# Patient Record
Sex: Male | Born: 1975 | Race: White | Hispanic: No | Marital: Married | Smoking: Never smoker
Health system: Southern US, Community
[De-identification: ages and names within clinical notes are randomized; demographics above are authoritative.]

## PROBLEM LIST (undated history)

## (undated) DIAGNOSIS — G4730 Sleep apnea, unspecified: Secondary | ICD-10-CM

## (undated) DIAGNOSIS — E669 Obesity, unspecified: Secondary | ICD-10-CM

## (undated) DIAGNOSIS — M109 Gout, unspecified: Secondary | ICD-10-CM

## (undated) DIAGNOSIS — IMO0001 Reserved for inherently not codable concepts without codable children: Secondary | ICD-10-CM

## (undated) DIAGNOSIS — K219 Gastro-esophageal reflux disease without esophagitis: Secondary | ICD-10-CM

## (undated) DIAGNOSIS — R7989 Other specified abnormal findings of blood chemistry: Secondary | ICD-10-CM

## (undated) DIAGNOSIS — I10 Essential (primary) hypertension: Secondary | ICD-10-CM

## (undated) DIAGNOSIS — R945 Abnormal results of liver function studies: Principal | ICD-10-CM

## (undated) DIAGNOSIS — S4990XA Unspecified injury of shoulder and upper arm, unspecified arm, initial encounter: Secondary | ICD-10-CM

## (undated) HISTORY — DX: Reserved for inherently not codable concepts without codable children: IMO0001

## (undated) HISTORY — DX: Essential (primary) hypertension: I10

## (undated) HISTORY — DX: Other specified abnormal findings of blood chemistry: R79.89

## (undated) HISTORY — DX: Unspecified injury of shoulder and upper arm, unspecified arm, initial encounter: S49.90XA

## (undated) HISTORY — DX: Abnormal results of liver function studies: R94.5

---

## 2006-05-14 ENCOUNTER — Emergency Department: Payer: Self-pay | Admitting: Internal Medicine

## 2006-05-15 ENCOUNTER — Emergency Department: Payer: Self-pay | Admitting: Emergency Medicine

## 2010-09-14 DIAGNOSIS — R0602 Shortness of breath: Secondary | ICD-10-CM

## 2010-09-15 ENCOUNTER — Observation Stay: Payer: Self-pay | Admitting: Internal Medicine

## 2010-09-15 ENCOUNTER — Ambulatory Visit: Payer: 59 | Admitting: Family Medicine

## 2010-09-15 ENCOUNTER — Encounter: Payer: Self-pay | Admitting: Cardiovascular Disease

## 2010-09-15 ENCOUNTER — Encounter: Payer: Self-pay | Admitting: Family Medicine

## 2010-09-15 DIAGNOSIS — R152 Fecal urgency: Secondary | ICD-10-CM

## 2010-09-15 DIAGNOSIS — I10 Essential (primary) hypertension: Secondary | ICD-10-CM | POA: Insufficient documentation

## 2010-09-15 DIAGNOSIS — K5900 Constipation, unspecified: Secondary | ICD-10-CM

## 2010-09-16 ENCOUNTER — Encounter: Payer: Self-pay | Admitting: Cardiovascular Disease

## 2010-09-16 ENCOUNTER — Encounter: Payer: Self-pay | Admitting: Family Medicine

## 2010-09-16 DIAGNOSIS — R079 Chest pain, unspecified: Secondary | ICD-10-CM

## 2010-09-22 ENCOUNTER — Telehealth: Payer: Self-pay | Admitting: Cardiovascular Disease

## 2010-09-25 NOTE — Assessment & Plan Note (Signed)
Summary: CONSTIPATION AND ABD PAIN/EVM   Vital Signs:  Patient Profile:   35 Years Old Male CC:      constipation, abdominal discomfort Weight:      257 pounds O2 Sat:      97 % O2 treatment:    Room Air Temp:     97.6 degrees F oral Pulse rate:   109 / minute Pulse rhythm:   regular Resp:     14 per minute BP sitting:   225 / 141  (right arm)  Pt. in pain?   no                  Serial Vital Signs/Assessments:  Time      Position  BP       Pulse  Resp  Temp     By 1134 am             224/139  107    12             Clanford Johnson MD   Current Allergies (reviewed today): No known allergies History of Present Illness History from: patient Reason for visit: see chief complaint Chief Complaint: constipation, abdominal discomfort History of Present Illness: The patient presented today for evaluation because he has not been able to have a full bowel movement for 1 week.  He says that he feels bloated and he has been taking OTC laxative medications with only minimal relief.  He has a history of HTN and says that he has not been taking any medications for a long time.  He has not seen a care provider in a long time.  He denies CP and SOB.  The patient reports that he has not seen a physician in several years. He reports that he stopped taking his blood pressure medication years ago because it made him feel tired and he did not like that feeling. The reason he came in today was because he was concerned about not being able to have a bowel movement for a week. He reports that he tried Colace and try milk of magnesia but no significant improvement. He reports that he did have a very small bowel movement recently but there was no significant volume expelled. The patient reports that he's been straining to defecate. The patient reports no blood in the stool. The patient reports that he has cramping pain in the abdomen. The patient reports that his blood pressures have been high for very long  time. He reports that he expected that the blood pressure would be very high.  REVIEW OF SYSTEMS Constitutional Symptoms      Denies fever, chills, night sweats, weight loss, weight gain, and fatigue.  Eyes       Denies change in vision, eye pain, eye discharge, glasses, contact lenses, and eye surgery. Ear/Nose/Throat/Mouth       Denies hearing loss/aids, change in hearing, ear pain, ear discharge, dizziness, frequent runny nose, frequent nose bleeds, sinus problems, sore throat, hoarseness, and tooth pain or bleeding.  Respiratory       Denies dry cough, productive cough, wheezing, shortness of breath, asthma, bronchitis, and emphysema/COPD.  Cardiovascular       Denies murmurs, chest pain, and tires easily with exhertion.    Gastrointestinal       Complains of stomach pain, nausea/vomiting, constipation, and indigestion.      Denies diarrhea and blood in bowel movements. Genitourniary       Denies painful urination, kidney stones, and  loss of urinary control. Neurological       Denies paralysis, seizures, and fainting/blackouts. Musculoskeletal       Complains of gout.      Denies muscle pain, joint pain, joint stiffness, decreased range of motion, redness, swelling, and muscle weakness.  Skin       Denies bruising, unusual mles/lumps or sores, and hair/skin or nail changes.  Psych       Denies mood changes, temper/anger issues, anxiety/stress, speech problems, depression, and sleep problems.  Past History:  Past Medical History: hypertension Poor Medical Follow Up Care  Past Surgical History: Denies surgical history  Family History: Mother-hypertension Father-unknown medical history Brother alive and healthy  Social History: The Patient Denies Tobacco, Alcohol and Recreational Drug Use. Physical Exam General appearance: poorly groomed, well developed, well nourished, no acute distress Head: normocephalic, atraumatic Eyes: conjunctivae and lids normal Pupils: equal,  round, reactive to light Ears: normal, no lesions or deformities Nasal: mucosa pink, nonedematous, no septal deviation, turbinates normal Oral/Pharynx: tongue normal, posterior pharynx without erythema or exudate Neck: neck supple,  trachea midline, no masses Thyroid: no nodules, masses, tenderness, or enlargement Chest/Lungs: no rales, wheezes, or rhonchi bilateral, breath sounds equal without effort Heart: tachycardic rate and bounding pulses Abdomen: soft, non-tender without obvious organomegaly, digital rectal exam reveals no impacted stool GU: no suprapubic tenderness Extremities: normal extremities Neurological: grossly intact and non-focal Skin: no obvious rashes or lesions MSE: oriented to time, place, and person Assessment New Problems: HYPERTENSION, ESSENTIAL, UNCONTROLLED (ICD-401.9) CONSTIPATION (ICD-564.00) FECAL URGENCY (ZOX-096.04)   Patient Education: The risks, benefits and possible side effects were clearly explained and discussed with the patient.  The patient verbalized clear understanding.  The patient was given instructions to return if symptoms don't improve, worsen or new changes develop.  If it is not during clinic hours and the patient cannot get back to this clinic then the patient was told to seek medical care at an available urgent care or emergency department.  The patient verbalized understanding.   Demonstrates willingness to comply.  Plan Planning Comments:   I advised the patient to go directly to the emergency department for evaluation of his hypertensive urgency. His blood pressure was 225/150. I was concerned that we needed to check for endorgan damage. He needed an EKG and urinalysis and electrolytes to be tested. Also because of his severe constipation he needed an enema and possible hospitalization. I discussed this with the patient who was in agreement and also with his mother who agreed to take him immediately to the hospital by private vehicle. I  called over to [facility] and spoke with the admissions charge nurse and checked out to her regarding the patient's condition and findings.  She said that she will be able to See the Patient and Get Him Treated Immediately.  Follow Up: after treatment in the ED  The patient and/or caregiver has been counseled thoroughly with regard to medications prescribed including dosage, schedule, interactions, rationale for use, and possible side effects and they verbalize understanding.  Diagnoses and expected course of recovery discussed and will return if not improved as expected or if the condition worsens. Patient and/or caregiver verbalized understanding.   Patient Instructions: 1)  Go to the emergency department at [facility] for treatment of your elevated blood pressure and severe constipation. We have already called over there and given them your name and information and they will be expecting you to arrive shortly. 2)  Check your  Blood Pressure regularly. If it is above: 140/90 you should make an appointment. 3)  Limit your Sodium (Salt) to less than 2 grams a day(slightly less than 1/2 a teaspoon) to prevent fluid retention, swelling, or worsening of symptoms. 4)  The patient was informed that there is no on-call provider or services available at this clinic during off-hours (when the clinic is closed).  If the patient developed a problem or concern that required immediate attention, the patient was advised to go the the nearest available urgent care or emergency department for medical care.  The patient verbalized understanding.    5)  Return or go to the ER if no improvement or symptoms getting worse.    Medication Administration  Medication # 1:    Medication: Clonidine 0.1mg  tab    Diagnosis: HYPERTENSION, ESSENTIAL, UNCONTROLLED (ICD-401.9)    Dose: 2 tabs    Route: po

## 2010-09-25 NOTE — Progress Notes (Signed)
Summary: History and Physical from ER  History and Physical from ER   Imported By: Dorna Leitz 09/16/2010 16:18:06  _____________________________________________________________________  External Attachment:    Type:   Image     Comment:   External Document

## 2010-09-25 NOTE — Letter (Signed)
Summary: Cardiopulmonary ECG  Cardiopulmonary ECG   Imported By: Dorna Leitz 09/16/2010 14:13:45  _____________________________________________________________________  External Attachment:    Type:   Image     Comment:   External Document

## 2010-09-25 NOTE — Letter (Signed)
Summary: Faxed medical release form  Faxed medical release form   Imported By: Dorna Leitz 09/16/2010 16:19:40  _____________________________________________________________________  External Attachment:    Type:   Image     Comment:   External Document

## 2010-09-29 ENCOUNTER — Encounter: Payer: Self-pay | Admitting: Cardiovascular Disease

## 2010-09-29 ENCOUNTER — Encounter (INDEPENDENT_AMBULATORY_CARE_PROVIDER_SITE_OTHER): Payer: 59 | Admitting: Cardiovascular Disease

## 2010-09-29 DIAGNOSIS — I10 Essential (primary) hypertension: Secondary | ICD-10-CM

## 2010-09-29 DIAGNOSIS — I517 Cardiomegaly: Secondary | ICD-10-CM

## 2010-10-01 NOTE — Progress Notes (Signed)
Summary: Problems sleeping  Phone Note Call from Patient Call back at [phone]   Caller: Dwaine Deter (mother) Call For: Mariah Milling Summary of Call: Pt is having nightmares that people are killing him from the BP medication that was prescribed to him in the hospital.  Pt is unable to sleep.  Pt has tried OTC sleep aides and they are not helping.  Pt is feeling exhausted.  Pt has never been a good sleeper.  Would like something to help him sleep. Initial call taken by: Harlon Flor,  September 22, 2010 9:42 AM  Follow-up for Phone Call        sorry, we don't do sleep meds. Would suggest he talk with PMD     Appended Document: Problems sleeping pt notified [place]

## 2010-10-07 NOTE — Assessment & Plan Note (Signed)
Summary: F/U [facility] Myoview   Visit Type:  Initial Consult Primary Provider:  None  CC:  F/U [facility].  He has bad dreams with the new blood pressure medication & fatigue.Marland Kitchen  History of Present Illness: Jared Watkins is a pleasant 35 year old gentleman with obesity, hypertension, right bundle branch block, moderate LVH, hyperlipidemia who presented to the hospital on every 6 with abdominal pain, constipation and severe hypertension. Cardiology was consulted for his hypertension. He presents for routine followup today. he had borderline elevation of his cardiac enzymes and hospitalist service had ordered a stress test.  He was diagnosed with hypertension 3 or more years ago and took medications for a while though then stopped on his own. Recent development of abdominal pain felt secondary to constipation. He has been under any stress recently. In the emergency room, initial blood pressure 234/137. He was started on lisinopril HCT and metoprolol.  In followup today, he reports his blood pressure has improved with systolic still in the 150-160 range, diastolic in the 100 range occasional 90s. He feels tired and believes it is the medicine. No other side effects noted. No further constipation  Myoview showed no ischemia, ejection fraction 42%, mildly dilated left ventricle  EKG shows normal sinus rhythm with right bundle branch block, rate 74 beats per minute left atrial enlargement  Echocardiogram Sep 15 2010 shows moderate LVH, ejection fraction 50%, mild MR, mildly dilated left atrium  Current Medications (verified): 1)  Metoprolol Tartrate 50 Mg Tabs (Metoprolol Tartrate) .Marland Kitchen.. 1 Tablet Two Times A Day 2)  Lisinopril-Hydrochlorothiazide 20-25 Mg Tabs (Lisinopril-Hydrochlorothiazide) .Marland Kitchen.. 1 Tablet Once Daily 3)  Aspir-Low 81 Mg Tbec (Aspirin) .Marland Kitchen.. 1 Tablet Once Daily 4)  Senokot 8.6 Mg Tabs (Sennosides) .Marland Kitchen.. 1 To 2 Tablets By Mouth Daily As Needed For Constipation 5)  Colace 100 Mg Caps (Docusate  Sodium) .Marland Kitchen.. 1 Tablet By Mouth Daily  Allergies (verified): No Known Drug Allergies  Past History:  Past Medical History: Last updated: 09/26/2010 Hypertension Poor Medical Follow Up Care Hyperlipidemia  Past Surgical History: Last updated: 09/15/2010 Denies surgical history  Family History: Last updated: 09/26/2010 Mother-hypertension Father-liver disease Brother-HTN  Social History: Last updated: 09/26/2010 The Patient Denies Tobacco, Alcohol and Recreational Drug Use. Full Time Married   Review of Systems  The patient denies fever, weight loss, weight gain, vision loss, decreased hearing, hoarseness, chest pain, syncope, dyspnea on exertion, peripheral edema, prolonged cough, abdominal pain, incontinence, muscle weakness, depression, and enlarged lymph nodes.         fatigue  Vital Signs:  Patient profile:   35 year old male Height:      75 inches Weight:      267 pounds BMI:     33.49 Pulse rate:   74 / minute BP sitting:   162 / 98  (left arm) Cuff size:   large  Vitals Entered By: Bishop Dublin, CMA (September 29, 2010 11:14 AM)  Physical Exam  General:  Well developed, well nourished, in no acute distress.mildly obese Head:  normocephalic and atraumatic Neck:  Neck supple, no JVD. No masses, thyromegaly or abnormal cervical nodes. Lungs:  Clear bilaterally to auscultation and percussion. Heart:  Non-displaced PMI, chest non-tender; regular rate and rhythm, S1, S2 without murmurs, rubs or gallops. Carotid upstroke normal, no bruit. Pedals normal pulses. No edema, no varicosities. Abdomen:  Bowel sounds positive; abdomen soft and non-tender without masses Msk:  Back normal, normal gait. Muscle strength and tone normal. Pulses:  pulses normal in all 4 extremities Extremities:  No clubbing or cyanosis. Neurologic:  Alert and oriented x 3. Skin:  Intact without lesions or rashes. Psych:  Normal affect.   Impression & Recommendations:  Problem # 1:   HYPERTENSION, ESSENTIAL, UNCONTROLLED (ICD-401.9) blood pressure continues to be elevated. We will increase his lisinopril HCT to 20/25 mg b.i.d. up from daily. We will decrease his metoprolol to 25 mg b.i.d. in an effort to improve his fatigue. I've asked him to monitor his blood pressure and if he continues to be elevated, we could consider any additional medication such as clonidine 0.1 mg b.i.d.. Alternatively we could try amlodipine would be concerned about lower extremity edema.  His updated medication list for this problem includes:    Metoprolol Tartrate 50 Mg Tabs (Metoprolol tartrate) .Marland Kitchen... Take 1/2 tablet two times a day    Lisinopril-hydrochlorothiazide 20-25 Mg Tabs (Lisinopril-hydrochlorothiazide) .Marland Kitchen... Take one tablet two times a day    Aspir-low 81 Mg Tbec (Aspirin) .Marland Kitchen... 1 tablet once daily  Problem # 2:  PREVENTIVE HEALTH CARE (ICD-V70.0) Cholesterol was in a reasonable range when checked in the hospital. Total cholesterol 168, HDL 39, LDL 105  Problem # 3:  VENTRICULAR HYPERTROPHY, LEFT (ICD-429.3) Moderate LVH on echo with diastolic dysfunction. No significant symptoms of shortness of breath or edema. Continue him on diuretic with HCTZ50 mg daily.  Other Orders: EKG w/ Interpretation (93000)  Patient Instructions: 1)  Your physician recommends that you schedule a follow-up appointment in: 3 months. 2)  Your physician has recommended you make the following change in your medication: Start taking Metorplol 50 mg 1/2 tablet two times a day & Lisinopril 20/25 mg two times a day.

## 2010-10-10 ENCOUNTER — Telehealth: Payer: Self-pay | Admitting: Cardiovascular Disease

## 2010-10-16 NOTE — Progress Notes (Signed)
Summary: Blood Pressure Issues Associated with Dizziness  Phone Note Call from Patient   Caller: Spouse Call For: Gollan Summary of Call: Patient's wife "Jared Watkins" called and states that since Jared Watkins was in our office last and his blood pressure medications were change he is having problems with dizziness after he bends over and stands up.  Also we he has been sitting and stands up he feels dizzy.  Please call wife back at work [phone] Initial call taken by: [place],  October 10, 2010 11:22 AM  Follow-up for Phone Call        Called pt's wife, she was busy and will call me back today. Lanny Hurst RN  October 10, 2010 4:40 PM  Spoke to pt's wife, she states that pt has been monitoring BP since his appt. And she states his dizziness seems to only occur when he stands up quickly or changes position. We started pt on Metoprolol 50mg  1/2 tab bid and lisinopril 20/25 qd. Pt's wife does not recall what his BP has been running exactly, but she does have it written down at home. Advised her to have pt call me Monday with update of symptoms and to give [place] copy of BP results with symptoms/frequency written as well. She recalls his SBP not ever being below 100. Told pt's wife this could be him getting used to medication since he was not on before.  Advised that pt change positions slowly until Dr. Mariah Milling can review his BP numbers to see if possible change is needed. Otherwise pt will call back on Monday. Follow-up by: Lanny Hurst RN,  October 10, 2010 5:07 PM

## 2010-10-21 NOTE — Letter (Signed)
[facility]   [facility]   Imported By: Roderic Ovens 10/14/2010 15:06:25  _____________________________________________________________________  External Attachment:    Type:   Image     Comment:   External Document

## 2010-10-21 NOTE — Letter (Signed)
Summary[facility]   Imported By: Earl Many 10/14/2010 16:25:40  _____________________________________________________________________  External Attachment:    Type:   Image     Comment:   External Document

## 2010-10-21 NOTE — Letter (Signed)
Summary: [facility]: Discharge Summary  [facility]: Discharge Summary   Imported By: Earl Many 10/14/2010 16:24:02  _____________________________________________________________________  External Attachment:    Type:   Image     Comment:   External Document

## 2010-10-21 NOTE — Letter (Signed)
Summary: [facility]: [place]- [place] [facility] Myocardial ST Scan  [facility]: [place]- [place] [facility] Myocardial ST Scan   Imported By: Earl Many 10/14/2010 16:27:44  _____________________________________________________________________  External Attachment:    Type:   Image     Comment:   External Document

## 2010-12-29 ENCOUNTER — Ambulatory Visit: Payer: 59 | Admitting: Cardiovascular Disease

## 2010-12-29 ENCOUNTER — Telehealth: Payer: Self-pay | Admitting: Cardiovascular Disease

## 2010-12-29 NOTE — Telephone Encounter (Signed)
Attempted to call pt in regards to a missed appt with Gollan on 12/29/10.  Voicemail is full and I was unable to leave a message.

## 2011-07-06 ENCOUNTER — Ambulatory Visit (INDEPENDENT_AMBULATORY_CARE_PROVIDER_SITE_OTHER): Payer: 59 | Admitting: Internal Medicine

## 2011-07-06 ENCOUNTER — Encounter: Payer: Self-pay | Admitting: Internal Medicine

## 2011-07-06 VITALS — BP 152/100 | HR 85 | Temp 98.3°F | Wt 305.0 lb

## 2011-07-06 DIAGNOSIS — G4700 Insomnia, unspecified: Secondary | ICD-10-CM

## 2011-07-06 DIAGNOSIS — Z0000 Encounter for general adult medical examination without abnormal findings: Secondary | ICD-10-CM

## 2011-07-06 DIAGNOSIS — I10 Essential (primary) hypertension: Secondary | ICD-10-CM

## 2011-07-06 DIAGNOSIS — Z23 Encounter for immunization: Secondary | ICD-10-CM

## 2011-07-06 LAB — CBC WITH DIFFERENTIAL/PLATELET
Basophils Absolute: 0 10*3/uL (ref 0.0–0.1)
Eosinophils Relative: 2.5 % (ref 0.0–5.0)
Lymphocytes Relative: 22.9 % (ref 12.0–46.0)
Lymphs Abs: 2.4 10*3/uL (ref 0.7–4.0)
Monocytes Relative: 8.5 % (ref 3.0–12.0)
Neutrophils Relative %: 65.9 % (ref 43.0–77.0)
Platelets: 256 10*3/uL (ref 150.0–400.0)
RDW: 12.6 % (ref 11.5–14.6)
WBC: 10.6 10*3/uL — ABNORMAL HIGH (ref 4.5–10.5)

## 2011-07-06 LAB — COMPREHENSIVE METABOLIC PANEL
AST: 55 U/L — ABNORMAL HIGH (ref 0–37)
Alkaline Phosphatase: 97 U/L (ref 39–117)
BUN: 15 mg/dL (ref 6–23)
Glucose, Bld: 85 mg/dL (ref 70–99)
Potassium: 4.3 mEq/L (ref 3.5–5.1)
Total Bilirubin: 0.4 mg/dL (ref 0.3–1.2)

## 2011-07-06 LAB — LIPID PANEL
Cholesterol: 217 mg/dL — ABNORMAL HIGH (ref 0–200)
HDL: 46.2 mg/dL (ref >39.00)
Total CHOL/HDL Ratio: 5
Triglycerides: 191 mg/dL — ABNORMAL HIGH (ref 0.0–149.0)
VLDL: 38.2 mg/dL (ref 0.0–40.0)

## 2011-07-06 LAB — LDL CHOLESTEROL, DIRECT: Direct LDL: 160.5 mg/dL

## 2011-07-06 MED ORDER — LISINOPRIL-HYDROCHLOROTHIAZIDE 20-25 MG PO TABS: 1.0000 | ORAL_TABLET | Freq: Every day | ORAL | Status: DC

## 2011-07-06 MED ORDER — TRAZODONE HCL 50 MG PO TABS
50.0000 mg | ORAL_TABLET | Freq: Every day | ORAL | Status: DC
Start: 2011-07-06 — End: 2011-07-08

## 2011-07-06 NOTE — Progress Notes (Signed)
Subjective:    Patient ID: Jared Watkins, male    DOB: 12-17-1975, 35 y.o.   MRN: [id number]  HPI 35YO male with h/o HTN presents to establish care. Reports has been out of lisinopril-HCTZ for about 1 month. Has been taking Metoprolol. Has not been checking BP. Denies chest pain, dyspnea, headache.  Complains of several months h/o insomnia. Reports he wakes in middle of night and sits up in bed worrying about things. Unable to fall back asleep. Anxious about being tired next day. Has tried benadryl with minimal improvement.  Outpatient Encounter Prescriptions as of 07/06/2011  Medication Sig Dispense Refill  . diphenhydrAMINE (BENADRYL) 25 MG tablet Take 25 mg by mouth at bedtime as needed.        Marland Kitchen lisinopril-hydrochlorothiazide (PRINZIDE,ZESTORETIC) 20-25 MG per tablet Take 1 tablet by mouth daily.  90 tablet  4  . metoprolol (LOPRESSOR) 50 MG tablet Take 25 mg by mouth 2 (two) times daily.        Marland Kitchen DISCONTD: lisinopril-hydrochlorothiazide (PRINZIDE,ZESTORETIC) 20-25 MG per tablet Take 1 tablet by mouth 2 (two) times daily.        Marland Kitchen aspirin 81 MG EC tablet Take 81 mg by mouth daily.        Marland Kitchen docusate sodium (COLACE) 100 MG capsule Take 100 mg by mouth daily.        Marland Kitchen senna (SENOKOT) 8.6 MG tablet Take 2 tablets by mouth daily as needed.        . traZODone (DESYREL) 50 MG tablet Take 1 tablet (50 mg total) by mouth at bedtime.  30 tablet  6    Review of Systems  Constitutional: Negative for fever, chills, activity change, appetite change, fatigue and unexpected weight change.  Eyes: Negative for visual disturbance.  Respiratory: Negative for cough and shortness of breath.   Cardiovascular: Negative for chest pain, palpitations and leg swelling.  Gastrointestinal: Negative for abdominal pain and abdominal distention.  Genitourinary: Negative for dysuria, urgency and difficulty urinating.  Musculoskeletal: Negative for arthralgias and gait problem.  Skin: Negative for color change and  rash.  Hematological: Negative for adenopathy.  Psychiatric/Behavioral: Positive for sleep disturbance. Negative for dysphoric mood. The patient is not nervous/anxious.    BP 152/100  Pulse 85  Temp(Src) 98.3 F (36.8 C) (Oral)  Wt 305 lb (138.347 kg)  SpO2 97%     Objective:   Physical Exam  Constitutional: He is oriented to person, place, and time. He appears well-developed and well-nourished. No distress.  HENT:  Head: Normocephalic and atraumatic.  Right Ear: External ear normal.  Left Ear: External ear normal.  Nose: Nose normal.  Mouth/Throat: Oropharynx is clear and moist. No oropharyngeal exudate.  Eyes: Conjunctivae and EOM are normal. Pupils are equal, round, and reactive to light. Right eye exhibits no discharge. Left eye exhibits no discharge. No scleral icterus.  Neck: Normal range of motion. Neck supple. No tracheal deviation present. No thyromegaly present.  Cardiovascular: Normal rate, regular rhythm and normal heart sounds.  Exam reveals no gallop and no friction rub.   No murmur heard. Pulmonary/Chest: Effort normal and breath sounds normal. No respiratory distress. He has no wheezes. He has no rales. He exhibits no tenderness.  Abdominal: Soft. Bowel sounds are normal. He exhibits no distension and no mass. There is no tenderness. There is no rebound and no guarding.  Musculoskeletal: Normal range of motion. He exhibits no edema.  Lymphadenopathy:    He has no cervical adenopathy.  Neurological: He is alert  and oriented to person, place, and time. No cranial nerve deficit. Coordination normal.  Skin: Skin is warm and dry. No rash noted. He is not diaphoretic. No erythema. No pallor.  Psychiatric: He has a normal mood and affect. His behavior is normal. Judgment and thought content normal.          Assessment & Plan:  1. Hypertension - BP elevated, but has not been taking meds. Will refill meds today. Check renal function with labs. Follow up 1 month.  2.  Insomnia - Will try trazodone to see if any improvement.  May increase to 100mg  at bedtime if no improvement. Follow up 1 month.  3. Health maintenance - Flu and Tdap today. Will check CBC, CMP, lipids with labs.

## 2011-07-06 NOTE — Patient Instructions (Signed)
Start Trazodone 50mg  at bedtime. If no improvement, may increase to 100mg  at bedtime.

## 2011-07-08 ENCOUNTER — Telehealth: Payer: Self-pay | Admitting: *Deleted

## 2011-07-08 DIAGNOSIS — G4700 Insomnia, unspecified: Secondary | ICD-10-CM

## 2011-07-08 DIAGNOSIS — I10 Essential (primary) hypertension: Secondary | ICD-10-CM

## 2011-07-08 MED ORDER — ATORVASTATIN CALCIUM 20 MG PO TABS: 20.0000 mg | ORAL_TABLET | Freq: Every day | ORAL | Status: DC

## 2011-07-08 MED ORDER — TRAZODONE HCL 50 MG PO TABS: 50.0000 mg | ORAL_TABLET | Freq: Every day | ORAL | Status: DC

## 2011-07-08 MED ORDER — LISINOPRIL-HYDROCHLOROTHIAZIDE 20-25 MG PO TABS: 1.0000 | ORAL_TABLET | Freq: Every day | ORAL | Status: DC

## 2011-07-08 NOTE — Telephone Encounter (Signed)
Message copied by Vernie Murders on Wed Jul 08, 2011  9:27 AM ------      Message from: Ronna Polio A      Created: Mon Jul 06, 2011  9:38 PM       Cholesterol was quite high on labs and liver function tests were elevated, likely from fatty liver. We should start lipitor 20mg  daily and repeat LFTs and Lipids in 1 month.

## 2011-08-05 ENCOUNTER — Encounter: Payer: Self-pay | Admitting: Internal Medicine

## 2011-08-05 ENCOUNTER — Ambulatory Visit (INDEPENDENT_AMBULATORY_CARE_PROVIDER_SITE_OTHER): Payer: 59 | Admitting: Internal Medicine

## 2011-08-05 VITALS — BP 112/80 | HR 75 | Temp 98.1°F | Wt 307.0 lb

## 2011-08-05 DIAGNOSIS — E785 Hyperlipidemia, unspecified: Secondary | ICD-10-CM

## 2011-08-05 DIAGNOSIS — G4700 Insomnia, unspecified: Secondary | ICD-10-CM

## 2011-08-05 MED ORDER — ESZOPICLONE 2 MG PO TABS: 2.0000 mg | ORAL_TABLET | Freq: Every day | ORAL | Status: DC

## 2011-08-05 NOTE — Progress Notes (Signed)
Subjective:    Patient ID: Jared Watkins, male    DOB: November 11, 1975, 35 y.o.   MRN: [id number]  HPI 35 year old male presents for followup. At his last visit, he complained of insomnia. He reports that he has difficulty both falling asleep and staying asleep. He was started on trazodone with improvement initially but then recurrence of his symptoms. He notes fatigue which she attributes to poor sleep. He typically sleeps only a couple of hours per night.  He was also noted at his last visit to have elevated cholesterol. He was started on Lipitor. He denies any problems with this medication. He denies any myalgia. He reports full compliance with medicine.  Outpatient Encounter Prescriptions as of 08/05/2011  Medication Sig Dispense Refill  . aspirin 81 MG EC tablet Take 81 mg by mouth daily.        Marland Kitchen atorvastatin (LIPITOR) 20 MG tablet Take 1 tablet (20 mg total) by mouth daily.  30 tablet  3  . diphenhydrAMINE (BENADRYL) 25 MG tablet Take 25 mg by mouth at bedtime as needed.        . docusate sodium (COLACE) 100 MG capsule Take 100 mg by mouth daily.        Marland Kitchen lisinopril-hydrochlorothiazide (PRINZIDE,ZESTORETIC) 20-25 MG per tablet Take 1 tablet by mouth daily.  90 tablet  4  . metoprolol (LOPRESSOR) 50 MG tablet Take 25 mg by mouth 2 (two) times daily.        Marland Kitchen senna (SENOKOT) 8.6 MG tablet Take 2 tablets by mouth daily as needed.          Review of Systems  Constitutional: Negative for fever, chills, activity change, appetite change, fatigue and unexpected weight change.  Eyes: Negative for visual disturbance.  Respiratory: Negative for cough and shortness of breath.   Cardiovascular: Negative for chest pain, palpitations and leg swelling.  Gastrointestinal: Negative for abdominal pain and abdominal distention.  Genitourinary: Negative for dysuria, urgency and difficulty urinating.  Musculoskeletal: Negative for arthralgias and gait problem.  Skin: Negative for color change and rash.    Hematological: Negative for adenopathy.  Psychiatric/Behavioral: Positive for sleep disturbance. Negative for dysphoric mood. The patient is not nervous/anxious.    BP 112/80  Pulse 75  Temp(Src) 98.1 F (36.7 C) (Oral)  Wt 307 lb (139.254 kg)  SpO2 97%     Objective:   Physical Exam  Constitutional: He is oriented to person, place, and time. He appears well-developed and well-nourished. No distress.  HENT:  Head: Normocephalic and atraumatic.  Right Ear: External ear normal.  Left Ear: External ear normal.  Nose: Nose normal.  Mouth/Throat: Oropharynx is clear and moist. No oropharyngeal exudate.  Eyes: Conjunctivae and EOM are normal. Pupils are equal, round, and reactive to light. Right eye exhibits no discharge. Left eye exhibits no discharge. No scleral icterus.  Neck: Normal range of motion. Neck supple. No tracheal deviation present. No thyromegaly present.  Cardiovascular: Normal rate, regular rhythm and normal heart sounds.  Exam reveals no gallop and no friction rub.   No murmur heard. Pulmonary/Chest: Effort normal and breath sounds normal. No respiratory distress. He has no wheezes. He has no rales. He exhibits no tenderness.  Musculoskeletal: Normal range of motion. He exhibits no edema.  Lymphadenopathy:    He has no cervical adenopathy.  Neurological: He is alert and oriented to person, place, and time. No cranial nerve deficit. Coordination normal.  Skin: Skin is warm and dry. No rash noted. He is not diaphoretic. No  erythema. No pallor.  Psychiatric: He has a normal mood and affect. His behavior is normal. Judgment and thought content normal.          Assessment & Plan:  1. Hyperlipidemia - LDL elevated over goal <100.  Started lipitor. Will recheck lipids and LFTs next week.  We also discussed limiting intake of saturated fat and increasing intake of fiber. We discussed goal of exercise most days of the week.  2. Insomnia - No improvement on  Trazodone. Will try changing to [facility] Samples given today. Pt will call or email with update.

## 2011-08-11 DIAGNOSIS — R42 Dizziness and giddiness: Secondary | ICD-10-CM

## 2011-08-11 HISTORY — DX: Dizziness and giddiness: R42

## 2011-08-21 ENCOUNTER — Other Ambulatory Visit (INDEPENDENT_AMBULATORY_CARE_PROVIDER_SITE_OTHER): Payer: 59 | Admitting: *Deleted

## 2011-08-21 DIAGNOSIS — E785 Hyperlipidemia, unspecified: Secondary | ICD-10-CM

## 2011-08-21 LAB — COMPREHENSIVE METABOLIC PANEL
ALT: 70 U/L — ABNORMAL HIGH (ref 0–53)
AST: 40 U/L — ABNORMAL HIGH (ref 0–37)
Albumin: 4.6 g/dL (ref 3.5–5.2)
Alkaline Phosphatase: 112 U/L (ref 39–117)
Potassium: 4.2 mEq/L (ref 3.5–5.1)
Sodium: 138 mEq/L (ref 135–145)
Total Protein: 7.8 g/dL (ref 6.0–8.3)

## 2011-08-21 LAB — LIPID PANEL
LDL Cholesterol: 78 mg/dL (ref 0–99)
Total CHOL/HDL Ratio: 4
Triglycerides: 99 mg/dL (ref 0.0–149.0)

## 2011-08-27 ENCOUNTER — Telehealth: Payer: Self-pay | Admitting: Internal Medicine

## 2011-08-27 DIAGNOSIS — G4700 Insomnia, unspecified: Secondary | ICD-10-CM

## 2011-08-27 MED ORDER — ESZOPICLONE 2 MG PO TABS: 2.0000 mg | ORAL_TABLET | Freq: Every day | ORAL | Status: DC

## 2011-08-27 NOTE — Telephone Encounter (Signed)
OK for rf?  

## 2011-08-27 NOTE — Telephone Encounter (Signed)
Patient needing a prescription for lunesta

## 2011-08-27 NOTE — Telephone Encounter (Signed)
OK for refill.

## 2011-08-27 NOTE — Telephone Encounter (Signed)
Rx called to pharmacy, 

## 2011-09-16 ENCOUNTER — Ambulatory Visit (INDEPENDENT_AMBULATORY_CARE_PROVIDER_SITE_OTHER)
Admission: RE | Admit: 2011-09-16 | Discharge: 2011-09-16 | Disposition: A | Discharge status: Home / Self Care | Payer: 59 | Source: Ambulatory Visit | Attending: Internal Medicine | Admitting: Internal Medicine

## 2011-09-16 ENCOUNTER — Encounter: Payer: Self-pay | Admitting: Internal Medicine

## 2011-09-16 ENCOUNTER — Ambulatory Visit (INDEPENDENT_AMBULATORY_CARE_PROVIDER_SITE_OTHER): Payer: 59 | Admitting: Internal Medicine

## 2011-09-16 VITALS — BP 150/100 | HR 106 | Temp 98.5°F | Ht 75.0 in | Wt 307.0 lb

## 2011-09-16 DIAGNOSIS — M542 Cervicalgia: Secondary | ICD-10-CM

## 2011-09-16 MED ORDER — PREDNISONE (PAK) 10 MG PO TABS: ORAL_TABLET | ORAL | Status: AC

## 2011-09-16 MED ORDER — CYCLOBENZAPRINE HCL 10 MG PO TABS: 10.0000 mg | ORAL_TABLET | Freq: Three times a day (TID) | ORAL | Status: AC | PRN

## 2011-09-16 NOTE — Progress Notes (Signed)
Subjective:    Patient ID: Jared Watkins, male    DOB: 10/14/1975, 36 y.o.   MRN: [id number]  HPI 36 year old male presents for acute visit after waking up this morning with severe pain in his left neck and left upper back. He denies any known injury to his neck or upper back. He reports the pain starts at the base of his skull and radiates down to above his left shoulder blade. He denies any weakness in his left arm. He denies any focal numbness in his left arm or elsewhere. He describes the pain as severe. He has not taken any medication for this.  Outpatient Encounter Prescriptions as of 09/16/2011  Medication Sig Dispense Refill  . aspirin 81 MG EC tablet Take 81 mg by mouth daily.        Marland Kitchen atorvastatin (LIPITOR) 20 MG tablet Take 1 tablet (20 mg total) by mouth daily.  30 tablet  3  . diphenhydrAMINE (BENADRYL) 25 MG tablet Take 25 mg by mouth at bedtime as needed.        . docusate sodium (COLACE) 100 MG capsule Take 100 mg by mouth daily.        . eszopiclone (LUNESTA) 2 MG TABS Take 1 tablet (2 mg total) by mouth at bedtime. Take immediately before bedtime  30 tablet  0  . lisinopril-hydrochlorothiazide (PRINZIDE,ZESTORETIC) 20-25 MG per tablet Take 1 tablet by mouth daily.  90 tablet  4  . metoprolol (LOPRESSOR) 50 MG tablet Take 25 mg by mouth 2 (two) times daily.        Marland Kitchen senna (SENOKOT) 8.6 MG tablet Take 2 tablets by mouth daily as needed.        . cyclobenzaprine (FLEXERIL) 10 MG tablet Take 1 tablet (10 mg total) by mouth 3 (three) times daily as needed for muscle spasms.  30 tablet  0  . predniSONE (STERAPRED UNI-PAK) 10 MG tablet Take 60mg  day 1 then taper by 10mg  daily  21 tablet  0    Review of Systems  Constitutional: Negative for fever, chills, activity change, appetite change, fatigue and unexpected weight change.  Eyes: Negative for visual disturbance.  Respiratory: Negative for cough and shortness of breath.   Cardiovascular: Negative for chest pain, palpitations and  leg swelling.  Gastrointestinal: Negative for abdominal pain and abdominal distention.  Genitourinary: Negative for dysuria, urgency and difficulty urinating.  Musculoskeletal: Positive for myalgias and arthralgias. Negative for gait problem.  Skin: Negative for color change and rash.  Neurological: Negative for dizziness, tremors, facial asymmetry, light-headedness, numbness and headaches.  Hematological: Negative for adenopathy.  Psychiatric/Behavioral: Negative for sleep disturbance and dysphoric mood. The patient is not nervous/anxious.    BP 150/100  Pulse 106  Temp(Src) 98.5 F (36.9 C) (Oral)  Ht 6\' 3"  (1.905 m)  Wt 307 lb (139.254 kg)  BMI 38.37 kg/m2  SpO2 96%     Objective:   Physical Exam  Constitutional: He is oriented to person, place, and time. He appears well-developed and well-nourished. He appears distressed.  HENT:  Head: Normocephalic and atraumatic.  Right Ear: External ear normal.  Left Ear: External ear normal.  Nose: Nose normal.  Mouth/Throat: Oropharynx is clear and moist. No oropharyngeal exudate.  Eyes: Conjunctivae and EOM are normal. Pupils are equal, round, and reactive to light. Right eye exhibits no discharge. Left eye exhibits no discharge. No scleral icterus.  Neck: Normal range of motion. Neck supple. No tracheal deviation present. No thyromegaly present.  Cardiovascular: Normal rate, regular  rhythm and normal heart sounds.  Exam reveals no gallop and no friction rub.   No murmur heard. Pulmonary/Chest: Effort normal and breath sounds normal. No respiratory distress. He has no wheezes. He has no rales. He exhibits no tenderness.  Musculoskeletal: Normal range of motion. He exhibits no edema.       Left shoulder: He exhibits tenderness, pain and spasm.       Cervical back: He exhibits tenderness, pain and spasm.       Arms: Lymphadenopathy:    He has no cervical adenopathy.  Neurological: He is alert and oriented to person, place, and time. No  cranial nerve deficit. Coordination normal.  Skin: Skin is warm and dry. No rash noted. He is not diaphoretic. No erythema. No pallor.  Psychiatric: He has a normal mood and affect. His behavior is normal. Judgment and thought content normal.          Assessment & Plan:

## 2011-09-16 NOTE — Assessment & Plan Note (Signed)
Pain appears to be secondary to spasm of the trapezius muscle. No current symptoms to suggest radiculopathy. However, will get plain x-ray of the cervical spine given severity of symptoms. Will start prednisone pack and use Flexeril as needed for severe pain. He will also apply ice to area. Patient will call if symptoms are not improving. He will followup in 2 days.

## 2011-09-18 ENCOUNTER — Encounter: Payer: Self-pay | Admitting: Internal Medicine

## 2011-09-18 ENCOUNTER — Telehealth: Payer: Self-pay | Admitting: Internal Medicine

## 2011-09-18 ENCOUNTER — Ambulatory Visit (INDEPENDENT_AMBULATORY_CARE_PROVIDER_SITE_OTHER): Payer: 59 | Admitting: Internal Medicine

## 2011-09-18 VITALS — BP 122/88 | HR 85 | Temp 97.9°F | Ht 75.0 in | Wt 310.0 lb

## 2011-09-18 DIAGNOSIS — E669 Obesity, unspecified: Secondary | ICD-10-CM

## 2011-09-18 DIAGNOSIS — R5383 Other fatigue: Secondary | ICD-10-CM

## 2011-09-18 DIAGNOSIS — R5381 Other malaise: Secondary | ICD-10-CM

## 2011-09-18 DIAGNOSIS — M542 Cervicalgia: Secondary | ICD-10-CM

## 2011-09-18 NOTE — Telephone Encounter (Signed)
Jared Watkins, in our office has it.  Thanks for working on our referrals so quickly!!

## 2011-09-18 NOTE — Assessment & Plan Note (Signed)
Secondary to muscular spasm in the left trapezius. Symptoms are improving with the use of prednisone and muscle relaxer. Plain x-rays of the cervical spine were normal. We'll continue to monitor. If symptoms do not continue to improve, we'll consider MRI cervical spine for further evaluation.

## 2011-09-18 NOTE — Progress Notes (Signed)
Subjective:    Patient ID: Jared Watkins, male    DOB: 1976-02-07, 36 y.o.   MRN: [id number]  HPI 36 year old male presents for followup after recent history of severe left-sided neck and upper back pain. He reports that his symptoms improved with the use of prednisone and Flexeril. He continues to have occasional pain in his left neck and upper back, however this is limited. He has been able to work this week. He denies any numbness in his left arm, weakness, or other focal neurological symptoms.  He is also concerned today about progress of fatigue. He notes that he snores when he sleeps. He also notes significant daytime somnolence. He has never been evaluated for sleep apnea. He reports very poor diet which is high in saturated fat. He denies any regular exercise. He notes that he has gained a significant amount of weight over the years.   Outpatient Encounter Prescriptions as of 09/18/2011  Medication Sig Dispense Refill  . aspirin 81 MG EC tablet Take 81 mg by mouth daily.        Marland Kitchen atorvastatin (LIPITOR) 20 MG tablet Take 1 tablet (20 mg total) by mouth daily.  30 tablet  3  . cyclobenzaprine (FLEXERIL) 10 MG tablet Take 1 tablet (10 mg total) by mouth 3 (three) times daily as needed for muscle spasms.  30 tablet  0  . diphenhydrAMINE (BENADRYL) 25 MG tablet Take 25 mg by mouth at bedtime as needed.        . docusate sodium (COLACE) 100 MG capsule Take 100 mg by mouth daily.        . eszopiclone (LUNESTA) 2 MG TABS Take 1 tablet (2 mg total) by mouth at bedtime. Take immediately before bedtime  30 tablet  0  . lisinopril-hydrochlorothiazide (PRINZIDE,ZESTORETIC) 20-25 MG per tablet Take 1 tablet by mouth daily.  90 tablet  4  . metoprolol (LOPRESSOR) 50 MG tablet Take 25 mg by mouth 2 (two) times daily.        . predniSONE (STERAPRED UNI-PAK) 10 MG tablet Take 60mg  day 1 then taper by 10mg  daily  21 tablet  0  . senna (SENOKOT) 8.6 MG tablet Take 2 tablets by mouth daily as needed.           Review of Systems  Constitutional: Positive for fatigue. Negative for fever, chills, activity change, appetite change and unexpected weight change.  HENT: Positive for neck pain and neck stiffness.   Eyes: Negative for visual disturbance.  Respiratory: Negative for cough and shortness of breath.   Cardiovascular: Negative for chest pain, palpitations and leg swelling.  Gastrointestinal: Negative for abdominal pain and abdominal distention.  Genitourinary: Negative for dysuria, urgency and difficulty urinating.  Musculoskeletal: Positive for myalgias and back pain. Negative for arthralgias and gait problem.  Skin: Negative for color change and rash.  Hematological: Negative for adenopathy.  Psychiatric/Behavioral: Negative for sleep disturbance and dysphoric mood. The patient is not nervous/anxious.    BP 122/88  Pulse 85  Temp(Src) 97.9 F (36.6 C) (Oral)  Ht 6\' 3"  (1.905 m)  Wt 310 lb (140.615 kg)  BMI 38.75 kg/m2  SpO2 98%     Objective:   Physical Exam  Constitutional: He is oriented to person, place, and time. He appears well-developed and well-nourished. No distress.  HENT:  Head: Normocephalic and atraumatic.  Right Ear: External ear normal.  Left Ear: External ear normal.  Nose: Nose normal.  Mouth/Throat: Oropharynx is clear and moist. No oropharyngeal exudate.  Eyes: Conjunctivae and EOM are normal. Pupils are equal, round, and reactive to light. Right eye exhibits no discharge. Left eye exhibits no discharge. No scleral icterus.  Neck: Normal range of motion. Neck supple. No tracheal deviation present. No thyromegaly present.  Cardiovascular: Normal rate, regular rhythm and normal heart sounds.  Exam reveals no gallop and no friction rub.   No murmur heard. Pulmonary/Chest: Effort normal and breath sounds normal. No respiratory distress. He has no wheezes. He has no rales. He exhibits no tenderness.  Musculoskeletal: He exhibits no edema.       Cervical back: He  exhibits decreased range of motion, pain and spasm.       Back:  Lymphadenopathy:    He has no cervical adenopathy.  Neurological: He is alert and oriented to person, place, and time. No cranial nerve deficit. Coordination normal.  Skin: Skin is warm and dry. No rash noted. He is not diaphoretic. No erythema. No pallor.  Psychiatric: He has a normal mood and affect. His behavior is normal. Judgment and thought content normal.          Assessment & Plan:

## 2011-09-18 NOTE — Telephone Encounter (Signed)
I dont have any contact information for [employer] and cant find it online.  Could you provide for me

## 2011-09-18 NOTE — Assessment & Plan Note (Signed)
Likely multifactorial. Patient follows a very poor diet. He reports snoring when sleeping which raises the question of sleep apnea. He is obese. We discussed efforts at improvement dietary and weight loss. Will plan to get a sleep study. He will followup in one month.

## 2011-09-18 NOTE — Assessment & Plan Note (Signed)
BMI 38. Patient with symptoms of snoring and daytime somnolence which raises question of sleep apnea. Encouraged improved diet which is lower in saturated fat and higher in fiber. Also encouraged regular exercise. Encouraged goal of losing at least 10 pounds. Followup in one month.

## 2011-10-07 ENCOUNTER — Telehealth: Payer: Self-pay | Admitting: Internal Medicine

## 2011-10-07 NOTE — Telephone Encounter (Signed)
Sleep study showed mild sleep apnea.  Would like to set him up at [facility] for evaluation for CPAP.

## 2011-10-09 NOTE — Telephone Encounter (Signed)
Patient has had this sleep study done and they have faxed over the results.

## 2011-10-12 ENCOUNTER — Encounter: Payer: Self-pay | Admitting: Internal Medicine

## 2011-10-12 NOTE — Telephone Encounter (Signed)
Patient informed, form in process

## 2011-11-05 ENCOUNTER — Ambulatory Visit: Payer: Self-pay | Admitting: Internal Medicine

## 2011-11-10 ENCOUNTER — Other Ambulatory Visit: Payer: Self-pay | Admitting: *Deleted

## 2011-11-10 MED ORDER — METOPROLOL TARTRATE 50 MG PO TABS: 50.0000 mg | ORAL_TABLET | Freq: Two times a day (BID) | ORAL | Status: DC

## 2011-11-18 ENCOUNTER — Other Ambulatory Visit: Payer: Self-pay | Admitting: *Deleted

## 2011-11-18 MED ORDER — ATORVASTATIN CALCIUM 20 MG PO TABS: 20.0000 mg | ORAL_TABLET | Freq: Every day | ORAL | Status: DC

## 2011-11-26 ENCOUNTER — Other Ambulatory Visit (INDEPENDENT_AMBULATORY_CARE_PROVIDER_SITE_OTHER): Payer: 59 | Admitting: *Deleted

## 2011-11-26 DIAGNOSIS — Z79899 Other long term (current) drug therapy: Secondary | ICD-10-CM

## 2011-11-27 ENCOUNTER — Other Ambulatory Visit: Payer: Self-pay | Admitting: Internal Medicine

## 2011-11-27 LAB — HEPATIC FUNCTION PANEL
Bilirubin, Direct: 0.1 mg/dL (ref 0.0–0.3)
Total Bilirubin: 0.7 mg/dL (ref 0.3–1.2)

## 2011-11-30 ENCOUNTER — Encounter: Payer: Self-pay | Admitting: Internal Medicine

## 2011-12-04 ENCOUNTER — Ambulatory Visit: Payer: Self-pay | Admitting: Internal Medicine

## 2011-12-07 ENCOUNTER — Telehealth: Payer: Self-pay | Admitting: Internal Medicine

## 2011-12-07 NOTE — Telephone Encounter (Signed)
US abdomen showed fatty infiltration in the liver.  He will need to have a follow up visit to discuss this.

## 2011-12-07 NOTE — Telephone Encounter (Signed)
Patient notified. Appt scheduled for follow up

## 2011-12-10 ENCOUNTER — Encounter: Payer: Self-pay | Admitting: Internal Medicine

## 2011-12-10 ENCOUNTER — Ambulatory Visit (INDEPENDENT_AMBULATORY_CARE_PROVIDER_SITE_OTHER): Payer: 59 | Admitting: Internal Medicine

## 2011-12-10 VITALS — BP 138/88 | HR 92 | Temp 98.2°F | Resp 18 | Wt 310.5 lb

## 2011-12-10 DIAGNOSIS — K760 Fatty (change of) liver, not elsewhere classified: Secondary | ICD-10-CM | POA: Insufficient documentation

## 2011-12-10 DIAGNOSIS — K7689 Other specified diseases of liver: Secondary | ICD-10-CM

## 2011-12-10 DIAGNOSIS — K7581 Nonalcoholic steatohepatitis (NASH): Secondary | ICD-10-CM

## 2011-12-10 NOTE — Assessment & Plan Note (Signed)
We reviewed recent lab results and liver ultrasound results. Encouraged him to eliminate use of alcohol. Encouraged him to work on improving diet by limiting intake of saturated fats and processed carbohydrates. Encouraged him to increase physical activity. Gave information on steatohepatitis. Will send additional lab work to look for other etiologies of hepatitis, including viral serologies and evaluation for autoimmune hepatitis. However, given the findings on ultrasound, most likely cause of hepatitis is fatty infiltration. He has been set up with GI medicine for evaluation later this month. He will followup here in one month.

## 2011-12-10 NOTE — Progress Notes (Signed)
Subjective:    Patient ID: Jared Watkins, male    DOB: 1975/08/23, 36 y.o.   MRN: [id number]  HPI 36 year old male with history of obesity, hyperlipidemia, hypertension presents for followup. He was recently noted to have elevated liver function test on labs. He subsequently underwent liver ultrasound which showed steatohepatitis. He reports that his bother has a history of cirrhosis secondary to alcohol use. He notes that he typically drinks approximately 2 beers per day. He denies any previous history of liver dysfunction. He denies any abdominal pain, change in bowel habits, easy bruising or bleeding, or other concerns. He reports full compliance with his medications.  Outpatient Encounter Prescriptions as of 12/10/2011  Medication Sig Dispense Refill  . aspirin 81 MG EC tablet Take 81 mg by mouth daily.        Marland Kitchen atorvastatin (LIPITOR) 20 MG tablet Take 1 tablet (20 mg total) by mouth daily.  90 tablet  2  . diphenhydrAMINE (BENADRYL) 25 MG tablet Take 25 mg by mouth at bedtime as needed.        . docusate sodium (COLACE) 100 MG capsule Take 100 mg by mouth daily.        . eszopiclone (LUNESTA) 2 MG TABS Take 1 tablet (2 mg total) by mouth at bedtime. Take immediately before bedtime  30 tablet  0  . lisinopril-hydrochlorothiazide (PRINZIDE,ZESTORETIC) 20-25 MG per tablet Take 1 tablet by mouth daily.  90 tablet  4  . metoprolol (LOPRESSOR) 50 MG tablet Take 1 tablet (50 mg total) by mouth 2 (two) times daily. HOLD if Top number on BP is <100 and/or Heart rate <60  60 tablet  1  . senna (SENOKOT) 8.6 MG tablet Take 2 tablets by mouth daily as needed.          Review of Systems  Constitutional: Negative for fever, chills, activity change, appetite change, fatigue and unexpected weight change.  Eyes: Negative for visual disturbance.  Respiratory: Negative for cough and shortness of breath.   Cardiovascular: Negative for chest pain, palpitations and leg swelling.  Gastrointestinal: Negative for  abdominal pain and abdominal distention.  Genitourinary: Negative for dysuria, urgency and difficulty urinating.  Musculoskeletal: Negative for arthralgias and gait problem.  Skin: Negative for color change and rash.  Hematological: Negative for adenopathy.  Psychiatric/Behavioral: Negative for sleep disturbance and dysphoric mood. The patient is not nervous/anxious.    BP 138/88  Pulse 92  Temp(Src) 98.2 F (36.8 C) (Oral)  Resp 18  Wt 310 lb 8 oz (140.842 kg)  SpO2 99%     Objective:   Physical Exam  Constitutional: He is oriented to person, place, and time. He appears well-developed and well-nourished. No distress.  HENT:  Head: Normocephalic and atraumatic.  Right Ear: External ear normal.  Left Ear: External ear normal.  Nose: Nose normal.  Mouth/Throat: Oropharynx is clear and moist. No oropharyngeal exudate.  Eyes: Conjunctivae and EOM are normal. Pupils are equal, round, and reactive to light. Right eye exhibits no discharge. Left eye exhibits no discharge. No scleral icterus.  Neck: Normal range of motion. Neck supple. No tracheal deviation present. No thyromegaly present.  Cardiovascular: Normal rate, regular rhythm and normal heart sounds.  Exam reveals no gallop and no friction rub.   No murmur heard. Pulmonary/Chest: Effort normal and breath sounds normal. No respiratory distress. He has no wheezes. He has no rales. He exhibits no tenderness.  Abdominal: Soft. Bowel sounds are normal. He exhibits no distension and no mass. There is  no tenderness. There is no guarding.  Musculoskeletal: Normal range of motion. He exhibits no edema.  Lymphadenopathy:    He has no cervical adenopathy.  Neurological: He is alert and oriented to person, place, and time. No cranial nerve deficit. Coordination normal.  Skin: Skin is warm and dry. No rash noted. He is not diaphoretic. No erythema. No pallor.  Psychiatric: He has a normal mood and affect. His behavior is normal. Judgment and  thought content normal.          Assessment & Plan:

## 2011-12-10 NOTE — Patient Instructions (Signed)
Fatty Liver Fatty liver is the accumulation of fat in liver cells. It is also called hepatosteatosis or steatohepatitis. It is normal for your liver to contain some fat. If fat is more than 5 to 10% of your liver's weight, you have fatty liver.  There are often no symptoms (problems) for years while damage is still occurring. People often learn about their fatty liver when they have medical tests for other reasons. Fat can damage your liver for years or even decades without causing problems. When it becomes severe, it can cause fatigue, weight loss, weakness, and confusion. This makes you more likely to develop more serious liver problems. The liver is the largest organ in the body. It does a lot of work and often gives no warning signs when it is sick until late in a disease. The liver has many important jobs including:  Breaking down foods.   Storing vitamins, iron, and other minerals.   Making proteins.   Making bile for food digestion.   Breaking down many products including medications, alcohol and some poisons.  CAUSES  There are a number of different conditions, medications, and poisons that can cause a fatty liver. Eating too many calories causes fat to build up in the liver. Not processing and breaking fats down normally may also cause this. Certain conditions, such as obesity, diabetes, and high triglycerides also cause this. Most fatty liver patients tend to be middle-aged and over weight.  Some causes of fatty liver are:  Alcohol over consumption.   Malnutrition.   Steroid use.   Valproic acid toxicity.   Obesity.   Cushing's syndrome.   Poisons.   Tetracycline in high dosages.   Pregnancy.   Diabetes.   Hyperlipidemia.   Rapid weight loss.  Some people develop fatty liver even having none of these conditions. SYMPTOMS  Fatty liver most often causes no problems. This is called asymptomatic.  It can be diagnosed with blood tests and also by a liver biopsy.    It is one of the most common causes of minor elevations of liver enzymes on routine blood tests.   Specialized Imaging of the liver using ultrasound, CT (computed tomography) scan, or MRI (magnetic resonance imaging) can suggest a fatty liver but a biopsy is needed to confirm it.   A biopsy involves taking a small sample of liver tissue. This is done by using a needle. It is then looked at under a microscope by a specialist.  TREATMENT  It is important to treat the cause. Simple fatty liver without a medical reason may not need treatment.  Weight loss, fat restriction, and exercise in overweight patients produces inconsistent results but is worth trying.   Fatty liver due to alcohol toxicity may not improve even with stopping drinking.   Good control of diabetes may reduce fatty liver.   Lower your triglycerides through diet, medication or both.   Eat a balanced, healthy diet.   Increase your physical activity.   Get regular checkups from a liver specialist.   There are no medical or surgical treatments for a fatty liver or NASH, but improving your diet and increasing your exercise may help prevent or reverse some of the damage.  PROGNOSIS  Fatty liver may cause no damage or it can lead to an inflammation of the liver. This is, called steatohepatitis. When it is linked to alcohol abuse, it is called alcoholic steatohepatitis. It often is not linked to alcohol. It is then called nonalcoholic steatohepatitis, or NASH. Over   time the liver may become scarred and hardened. This condition is called cirrhosis. Cirrhosis is serious and may lead to liver failure or cancer. NASH is one of the leading causes of cirrhosis. About 10-20% of Americans have fatty liver and a smaller 2-5% has NASH. Document Released: 09/11/2005 Document Revised: 07/16/2011 Document Reviewed: 11/04/2005 [employer] Patient Information 2012 [employer]. 

## 2011-12-11 LAB — CBC WITH DIFFERENTIAL/PLATELET
Basophils Absolute: 0 10*3/uL (ref 0.0–0.1)
Eosinophils Relative: 1 % (ref 0–5)
Lymphocytes Relative: 21 % (ref 12–46)
MCV: 85.9 fL (ref 78.0–100.0)
Neutro Abs: 6.2 10*3/uL (ref 1.7–7.7)
Neutrophils Relative %: 70 % (ref 43–77)
Platelets: 269 10*3/uL (ref 150–400)
RDW: 12.8 % (ref 11.5–15.5)
WBC: 8.9 10*3/uL (ref 4.0–10.5)

## 2011-12-11 LAB — COMPREHENSIVE METABOLIC PANEL
ALT: 83 U/L — ABNORMAL HIGH (ref 0–53)
Albumin: 4.8 g/dL (ref 3.5–5.2)
CO2: 25 mEq/L (ref 19–32)
Calcium: 9.7 mg/dL (ref 8.4–10.5)
Chloride: 103 mEq/L (ref 96–112)
Glucose, Bld: 109 mg/dL — ABNORMAL HIGH (ref 70–99)
Potassium: 4.7 mEq/L (ref 3.5–5.3)
Sodium: 138 mEq/L (ref 135–145)
Total Protein: 7.3 g/dL (ref 6.0–8.3)

## 2011-12-11 LAB — IGG, IGA, IGM
IgA: 352 mg/dL (ref 68–379)
IgM, Serum: 176 mg/dL (ref 41–251)

## 2011-12-11 LAB — ANA: Anti Nuclear Antibody(ANA): NEGATIVE

## 2011-12-11 LAB — HEPATITIS B CORE ANTIBODY, TOTAL: Hep B Core Total Ab: NEGATIVE

## 2011-12-11 LAB — PROTIME-INR: INR: 0.92 (ref <1.50)

## 2011-12-14 LAB — ANTI-SMOOTH MUSCLE ANTIBODY, IGG: Smooth Muscle Ab: 13 U (ref <20)

## 2011-12-14 LAB — AMMONIA

## 2011-12-18 ENCOUNTER — Encounter: Payer: Self-pay | Admitting: Internal Medicine

## 2011-12-23 ENCOUNTER — Encounter: Payer: Self-pay | Admitting: Internal Medicine

## 2011-12-23 ENCOUNTER — Ambulatory Visit (INDEPENDENT_AMBULATORY_CARE_PROVIDER_SITE_OTHER): Payer: 59 | Admitting: Internal Medicine

## 2011-12-23 ENCOUNTER — Other Ambulatory Visit: Payer: Self-pay | Admitting: Gastroenterology

## 2011-12-23 VITALS — BP 120/80 | HR 78 | Ht 75.5 in | Wt 305.0 lb

## 2011-12-23 DIAGNOSIS — K7689 Other specified diseases of liver: Secondary | ICD-10-CM

## 2011-12-23 DIAGNOSIS — K7581 Nonalcoholic steatohepatitis (NASH): Secondary | ICD-10-CM

## 2011-12-23 DIAGNOSIS — E669 Obesity, unspecified: Secondary | ICD-10-CM

## 2011-12-23 DIAGNOSIS — R7989 Other specified abnormal findings of blood chemistry: Secondary | ICD-10-CM

## 2011-12-23 DIAGNOSIS — Z23 Encounter for immunization: Secondary | ICD-10-CM

## 2011-12-23 MED ORDER — VITAMIN E 180 MG (400 UNIT) PO CAPS: 400.0000 [IU] | ORAL_CAPSULE | Freq: Every day | ORAL | Status: DC

## 2011-12-23 NOTE — Progress Notes (Addendum)
Subjective:    Patient ID: Jared Watkins, male    DOB: 1976/06/30, 36 y.o.   MRN: [id number]  HPI Jared Watkins is a 36 yo male with PMH of hypertension, obesity, hypercholesterolemia who is seen in consultation at the request of Dr. Dan Watkins for evaluation of elevated liver enzymes and concern for fatty liver disease. The patient reports no previous issues with liver disease, and notes that he found out his liver tests were elevated when he established care in late 2012. He reports prior to this having had no real medical problems, and rarely visiting doctors. He reports no issues with abdominal pain, ascites, lower extremity edema. History of jaundice. No history of GI bleed, melena, bright red blood per rectum. He denies nausea and vomiting. Appetite is good. Weight has increased about 30 pounds in the last 8 or 9 months. He reports this is due to being less active at work over this time period. He denies fevers or chills. No itching. No rash. Denies trouble with heartburn, dysphagia or odynophagia.  He reports his father has issues with his liver, which he attributes to his heavy alcohol use. No other family history of liver disease to his knowledge  He reports rarely drinking alcohol, and that he has never been a heavy drinker. He reports when he did drink it was approximately 1 beer daily.  Review of Systems As per history of present illness, otherwise negative  Patient Active Problem List  Diagnoses  . HYPERTENSION, ESSENTIAL, UNCONTROLLED  . VENTRICULAR HYPERTROPHY, LEFT  . Neck pain on left side  . Fatigue  . Obesity  . NASH (nonalcoholic steatohepatitis)   History reviewed. No pertinent past surgical history.  Current Outpatient Prescriptions  Medication Sig Dispense Refill  . aspirin 81 MG EC tablet Take 81 mg by mouth every other day.       Marland Kitchen atorvastatin (LIPITOR) 20 MG tablet Take 1 tablet (20 mg total) by mouth daily.  90 tablet  2  . diphenhydrAMINE (BENADRYL) 25 MG tablet  Take 25 mg by mouth at bedtime as needed.        . eszopiclone (LUNESTA) 2 MG TABS Take 1 tablet (2 mg total) by mouth at bedtime. Take immediately before bedtime  30 tablet  0  . lisinopril-hydrochlorothiazide (PRINZIDE,ZESTORETIC) 20-25 MG per tablet Take 1 tablet by mouth daily.  90 tablet  4  . metoprolol (LOPRESSOR) 50 MG tablet Take 1 tablet (50 mg total) by mouth 2 (two) times daily. HOLD if Top number on BP is <100 and/or Heart rate <60  60 tablet  1  . vitamin E (GNP VITAMIN E) 400 UNIT capsule Take 1 capsule (400 Units total) by mouth daily.  30 capsule  2   No Known Allergies  Family History  Problem Relation Age of Onset  . Hypertension Mother   . Cirrhosis Father     Alcoholic-on liver transplant list  . Hypertension Brother   . Pancreatic cancer Paternal Grandfather   . Leukemia Maternal Grandfather   . Colon polyps Mother   . Clotting disorder Mother    History  Substance Use Topics  . Smoking status: Never Smoker   . Smokeless tobacco: Never Used  . Alcohol Use: No     Stopped when Lfts started to get elevated.       Objective:   Physical Exam BP 120/80  Pulse 78  Ht 6' 3.5" (1.918 m)  Wt 305 lb (138.347 kg)  BMI 37.62 kg/m2 Constitutional: Well-developed and well-nourished.  No distress. HEENT: Normocephalic and atraumatic. Oropharynx is clear and moist. No oropharyngeal exudate. Conjunctivae are normal. Pupils are equal round and reactive to light. No scleral icterus. Neck: Neck supple. Trachea midline. Cardiovascular: Normal rate, regular rhythm and intact distal pulses. No M/R/G Pulmonary/chest: Effort normal and breath sounds normal. No wheezing, rales or rhonchi. Abdominal: Soft, obese, nontender, nondistended. Bowel sounds active throughout. There are no masses palpable. No hepatosplenomegaly. Extremities: no clubbing, cyanosis, or edema Lymphadenopathy: No cervical adenopathy noted. Neurological: Alert and oriented to person place and time. Skin: Skin  is warm and dry. No rashes noted. Psychiatric: Normal mood and affect. Behavior is normal.  ANA neg, IgG normal, alpha one normal, ASMA neg, Viral heps neg  CBC    Component Value Date/Time   WBC 8.9 12/10/2011 1155   RBC 5.12 12/10/2011 1155   HGB 15.0 12/10/2011 1155   HCT 44.0 12/10/2011 1155   PLT 269 12/10/2011 1155   MCV 85.9 12/10/2011 1155   MCH 29.3 12/10/2011 1155   MCHC 34.1 12/10/2011 1155   RDW 12.8 12/10/2011 1155   LYMPHSABS 1.9 12/10/2011 1155   MONOABS 0.7 12/10/2011 1155   EOSABS 0.1 12/10/2011 1155   BASOSABS 0.0 12/10/2011 1155    CMP     Component Value Date/Time   NA 138 12/10/2011 1155   K 4.7 12/10/2011 1155   CL 103 12/10/2011 1155   CO2 25 12/10/2011 1155   GLUCOSE 109* 12/10/2011 1155   BUN 23 12/10/2011 1155   CREATININE 0.98 12/10/2011 1155   CREATININE 1.1 08/21/2011 1317   CALCIUM 9.7 12/10/2011 1155   PROT 7.3 12/10/2011 1155   ALBUMIN 4.8 12/10/2011 1155   AST 50* 12/10/2011 1155   ALT 83* 12/10/2011 1155   ALKPHOS 110 12/10/2011 1155   BILITOT 0.5 12/10/2011 1155    INR normal  Liver US -- steatosis, report in EPIC, no focal lesions.    Assessment & Plan:  36 yo male with PMH of hypertension, obesity, hypercholesterolemia who is seen in consultation at the request of Dr. Dan Watkins for evaluation of elevated liver enzymes and concern for fatty liver disease  1. Transaminitis/fatty liver disease -- the patient's workup to this date has been negative for obvious etiology of his liver inflammation. His liver ultrasound did show steatosis, and his low level of hepatocellular inflammation is consistent with NASH. His risk factors include obesity, hypertension, and hypercholesterolemia. We discussed fatty liver disease and liver inflammation at length today. We discussed how if left unchecked over years this can progress to liver scarring and cirrhosis. I strongly encouraged that he commit to a diet and exercise regimen, to allow him to lose weight. It seems that he has gained about 30 pounds in  the last 9 months, and I like for him to start by trying to lose 30 pounds over the next 6 months. He feels like this is possible, and will begin to try to actively lose weight. I recommended vitamin E 800 international units daily. He'll continue to work with Dr. Dan Watkins to control his blood pressure and cholesterol. I have recommended repeating his liver enzymes today, and would like to check iron studies.  I do not think at this point we need a liver biopsy as the most likely diagnosis is NASH  --Vaccination series for hepatitis A and hepatitis B will begin today  Return in 3 months.

## 2011-12-23 NOTE — Patient Instructions (Signed)
You were given your first round of HEP A and HEP B vaccine, your next round will be in 7 days from today.  Your physician has requested that you go to the basement for the  lab work on the day of your follow up visit in 3 months  Dr. Rhea Belton highly suggest to avoid alcohol. Watch your diet and get more exercise.  If you take Tylenol please only limit it to 2g in 24/hrs.  Dr. Rhea Belton would like to see you back in the office for a follow up visit in 3 months.

## 2011-12-30 ENCOUNTER — Ambulatory Visit (INDEPENDENT_AMBULATORY_CARE_PROVIDER_SITE_OTHER): Payer: 59 | Admitting: Internal Medicine

## 2011-12-30 DIAGNOSIS — K7689 Other specified diseases of liver: Secondary | ICD-10-CM

## 2011-12-30 DIAGNOSIS — K760 Fatty (change of) liver, not elsewhere classified: Secondary | ICD-10-CM

## 2011-12-30 DIAGNOSIS — Z23 Encounter for immunization: Secondary | ICD-10-CM

## 2012-01-09 HISTORY — PX: OTHER SURGICAL HISTORY: SHX169

## 2012-01-26 ENCOUNTER — Telehealth: Payer: Self-pay | Admitting: Internal Medicine

## 2012-01-26 NOTE — Telephone Encounter (Signed)
Pt asking if he can take his "liver injections at the [place] office due to his work schedule/he is taking hepatitis shots.

## 2012-01-26 NOTE — Telephone Encounter (Signed)
I don't know anything about his hepatitis shots?  Was he seen by GI? Can we get the records on this?

## 2012-01-26 NOTE — Telephone Encounter (Signed)
Ignore my last message, I pulled his notes from Dr. Rhea Belton. It looks like he needs vaccination for Hep A and B. Yes, we can give him TwinRix at clinic. Sorry for confusion. I was not aware that he had seen Dr. Rhea Belton.

## 2012-01-27 NOTE — Telephone Encounter (Signed)
Left message on cell phone voicemail for patient to return call. 

## 2012-01-27 NOTE — Telephone Encounter (Signed)
Lets have him get a Hep A antibody and Hep B surface antibody in 1 month to check immunity.

## 2012-01-27 NOTE — Telephone Encounter (Signed)
Dr. Dan Humphreys it looks like patient received his second dose of Twinrix (Hep A/Hep B) to early.  According to the dosing schedule he gets one vaccine then after one month he gets the second one and after six months he gets the last one.  Please advise.

## 2012-01-29 NOTE — Telephone Encounter (Signed)
Left message on cell phone voicemail for patient to return call. 

## 2012-01-30 DIAGNOSIS — F1511 Other stimulant abuse, in remission: Secondary | ICD-10-CM | POA: Insufficient documentation

## 2012-02-01 ENCOUNTER — Ambulatory Visit: Payer: 59 | Admitting: Internal Medicine

## 2012-02-01 DIAGNOSIS — Z0289 Encounter for other administrative examinations: Secondary | ICD-10-CM

## 2012-02-01 NOTE — Telephone Encounter (Signed)
Unable to reach patient by telephone, unable to reach letter mailed to home address.

## 2012-02-04 ENCOUNTER — Telehealth: Payer: Self-pay | Admitting: Internal Medicine

## 2012-02-04 NOTE — Telephone Encounter (Signed)
Hospital follow up put on for 7.1.13

## 2012-02-05 NOTE — Telephone Encounter (Signed)
Left message on cell phone for patient to return call.  

## 2012-02-08 ENCOUNTER — Telehealth: Payer: Self-pay | Admitting: *Deleted

## 2012-02-08 ENCOUNTER — Encounter: Payer: Self-pay | Admitting: Internal Medicine

## 2012-02-08 ENCOUNTER — Ambulatory Visit (INDEPENDENT_AMBULATORY_CARE_PROVIDER_SITE_OTHER): Payer: 59 | Admitting: Internal Medicine

## 2012-02-08 VITALS — BP 150/100 | HR 102 | Temp 98.5°F | Ht 75.0 in | Wt 295.5 lb

## 2012-02-08 DIAGNOSIS — F410 Panic disorder [episodic paroxysmal anxiety] without agoraphobia: Secondary | ICD-10-CM | POA: Insufficient documentation

## 2012-02-08 DIAGNOSIS — M25519 Pain in unspecified shoulder: Secondary | ICD-10-CM

## 2012-02-08 DIAGNOSIS — M25512 Pain in left shoulder: Secondary | ICD-10-CM

## 2012-02-08 DIAGNOSIS — S36039A Unspecified laceration of spleen, initial encounter: Secondary | ICD-10-CM | POA: Insufficient documentation

## 2012-02-08 DIAGNOSIS — I10 Essential (primary) hypertension: Secondary | ICD-10-CM

## 2012-02-08 DIAGNOSIS — S3609XA Other injury of spleen, initial encounter: Secondary | ICD-10-CM

## 2012-02-08 LAB — CBC WITH DIFFERENTIAL/PLATELET
Basophils Relative: 0.3 % (ref 0.0–3.0)
Eosinophils Absolute: 0.2 10*3/uL (ref 0.0–0.7)
Hemoglobin: 12.2 g/dL — ABNORMAL LOW (ref 13.0–17.0)
MCHC: 32.3 g/dL (ref 30.0–36.0)
MCV: 91.9 fl (ref 78.0–100.0)
Monocytes Absolute: 2.1 10*3/uL — ABNORMAL HIGH (ref 0.1–1.0)
Neutro Abs: 14.4 10*3/uL — ABNORMAL HIGH (ref 1.4–7.7)
RBC: 4.1 Mil/uL — ABNORMAL LOW (ref 4.22–5.81)

## 2012-02-08 LAB — COMPREHENSIVE METABOLIC PANEL
ALT: 150 U/L — ABNORMAL HIGH (ref 0–53)
AST: 111 U/L — ABNORMAL HIGH (ref 0–37)
Albumin: 3.9 g/dL (ref 3.5–5.2)
Alkaline Phosphatase: 411 U/L — ABNORMAL HIGH (ref 39–117)
BUN: 15 mg/dL (ref 6–23)
Potassium: 4.5 mEq/L (ref 3.5–5.1)

## 2012-02-08 LAB — POCT URINALYSIS DIPSTICK
Blood, UA: NEGATIVE
Protein, UA: NEGATIVE
Spec Grav, UA: 1.02
Urobilinogen, UA: 1
pH, UA: 7

## 2012-02-08 LAB — CK: Total CK: 104 U/L (ref 7–232)

## 2012-02-08 MED ORDER — ALPRAZOLAM 0.5 MG PO TABS: 0.5000 mg | ORAL_TABLET | Freq: Three times a day (TID) | ORAL | Status: DC | PRN

## 2012-02-08 MED ORDER — METOPROLOL SUCCINATE ER 25 MG PO TB24: 25.0000 mg | ORAL_TABLET | Freq: Every day | ORAL | Status: DC

## 2012-02-08 MED ORDER — HYDROCODONE-ACETAMINOPHEN 7.5-500 MG PO TABS: 1.0000 | ORAL_TABLET | Freq: Four times a day (QID) | ORAL | Status: AC | PRN

## 2012-02-08 NOTE — Assessment & Plan Note (Signed)
After traumatic injury. Pain with abduction. Exam consistent with dislocation.  Discussed with ortho, Dr. Hyacinth Meeker, who will see him at [facility] tomorrow. Will use hydrocodone for pain as needed until pt is seen by ortho.

## 2012-02-08 NOTE — Progress Notes (Signed)
Subjective:    Patient ID: Jared Watkins, male    DOB: March 03, 1976, 36 y.o.   MRN: [id number]  HPI 36 year old male with history of obesity and nonalcoholic steatohepatitis presents for followup after recent traumatic injury in which he was working on a tire and a ruptured. This resulted in head injury with loss of consciousness, splenic laceration, and superficial injury to both arms. Patient was transported to and cared for at [facility] in the ICU for 5 days. His mother reports that he had embolization of the splenic artery. He was discharged from the hospital on Friday, June 26. Since that time, he has been having severe pain in his left shoulder and difficulty moving his left arm. He has been using his mother's hydrocodone and prescribed Percocet from [facility] for pain. He reports minimal improvement with this. He denies any abdominal pain.  He is also concerned today about panic attacks. He reports these are worse at night. He is having significant anxiety. His mother has been giving him diazepam with minimal improvement.  Outpatient Encounter Prescriptions as of 02/08/2012  Medication Sig Dispense Refill  . acetaminophen (TYLENOL) 650 MG CR tablet Take 650 mg by mouth every 6 (six) hours as needed.      Marland Kitchen aspirin 81 MG EC tablet Take 81 mg by mouth every other day.       Marland Kitchen atorvastatin (LIPITOR) 20 MG tablet Take 1 tablet (20 mg total) by mouth daily.  90 tablet  2  . diphenhydrAMINE (BENADRYL) 25 MG tablet Take 25 mg by mouth at bedtime as needed.        Marland Kitchen lisinopril-hydrochlorothiazide (PRINZIDE,ZESTORETIC) 20-25 MG per tablet Take 1 tablet by mouth daily.  90 tablet  4  . metoprolol (LOPRESSOR) 50 MG tablet Take 1 tablet (50 mg total) by mouth 2 (two) times daily. HOLD if Top number on BP is <100 and/or Heart rate <60  60 tablet  1  . oxycodone (OXY-IR) 5 MG capsule Take 1-2 tablets by mouth every three hours as needed for pain      . senna-docusate (SENOKOT-S) 8.6-50 MG per tablet Take 2  tablets by mouth daily.      . vitamin E (GNP VITAMIN E) 400 UNIT capsule Take 1 capsule (400 Units total) by mouth daily.  30 capsule  2  . ALPRAZolam (XANAX) 0.5 MG tablet Take 1 tablet (0.5 mg total) by mouth 3 (three) times daily as needed for sleep or anxiety.  90 tablet  3  . HYDROcodone-acetaminophen (LORTAB 7.5) 7.5-500 MG per tablet Take 1 tablet by mouth every 6 (six) hours as needed for pain.  90 tablet  3  . metoprolol succinate (TOPROL-XL) 25 MG 24 hr tablet Take 1 tablet (25 mg total) by mouth daily.  30 tablet  6   BP 150/100  Pulse 102  Temp 98.5 F (36.9 C) (Oral)  Ht 6\' 3"  (1.905 m)  Wt 295 lb 8 oz (134.038 kg)  BMI 36.93 kg/m2  SpO2 96%  Review of Systems  Constitutional: Negative for fever, chills, diaphoresis and fatigue.  HENT: Positive for nosebleeds and neck pain. Negative for hearing loss, ear pain, congestion, sore throat, rhinorrhea, sneezing, drooling, mouth sores, trouble swallowing, neck stiffness, dental problem, voice change, postnasal drip, sinus pressure and tinnitus.   Eyes: Negative for visual disturbance.  Respiratory: Negative for shortness of breath.   Cardiovascular: Negative for chest pain and leg swelling.  Gastrointestinal: Negative for nausea, vomiting, abdominal pain, diarrhea, constipation and abdominal distention.  Genitourinary: Positive for hematuria. Negative for decreased urine volume.  Musculoskeletal: Positive for myalgias, joint swelling and arthralgias. Negative for back pain and gait problem.  Skin: Negative for color change, rash and wound.  Neurological: Positive for weakness and numbness. Negative for dizziness, light-headedness and headaches.  Psychiatric/Behavioral: Positive for disturbed wake/sleep cycle, dysphoric mood and agitation. Negative for suicidal ideas, hallucinations, behavioral problems, confusion and decreased concentration. The patient is nervous/anxious.        Objective:   Physical Exam  Constitutional: He  is oriented to person, place, and time. He appears well-developed and well-nourished. No distress.  HENT:  Head: Normocephalic and atraumatic.  Right Ear: External ear normal.  Left Ear: External ear normal.  Nose: Nose normal.  Mouth/Throat: Oropharynx is clear and moist. No oropharyngeal exudate.  Eyes: Conjunctivae and EOM are normal. Pupils are equal, round, and reactive to light. Right eye exhibits no discharge. Left eye exhibits no discharge. No scleral icterus.  Neck: Normal range of motion. Neck supple. No tracheal deviation present. No thyromegaly present.  Cardiovascular: Normal rate, regular rhythm and normal heart sounds.  Exam reveals no gallop and no friction rub.   No murmur heard. Pulmonary/Chest: Effort normal and breath sounds normal. No respiratory distress. He has no wheezes. He has no rales. He exhibits no tenderness.  Abdominal: Soft. Bowel sounds are normal. He exhibits no distension and no mass. There is no tenderness. There is no guarding.  Musculoskeletal: He exhibits no edema.       Left shoulder: He exhibits decreased range of motion, tenderness, bony tenderness, pain and decreased strength.  Lymphadenopathy:    He has no cervical adenopathy.  Neurological: He is alert and oriented to person, place, and time. No cranial nerve deficit. Coordination normal.  Skin: Skin is warm and dry. No rash noted. He is not diaphoretic. No erythema. No pallor.  Psychiatric: He has a normal mood and affect. His behavior is normal. Judgment and thought content normal.          Assessment & Plan:

## 2012-02-08 NOTE — Telephone Encounter (Signed)
Patient has appt with Dr. Dan Humphreys today.

## 2012-02-08 NOTE — Telephone Encounter (Signed)
Patient advised, see result note.

## 2012-02-08 NOTE — Telephone Encounter (Signed)
See result note. Lab abnormality secondary to traumatic injury. Will need repeat next week.

## 2012-02-08 NOTE — Assessment & Plan Note (Signed)
Blood pressure elevated today, likely secondary to pain and anxiety. Will start metoprolol in effort to help with both hypertension and potentially with episodes of panic. Followup 2 weeks.

## 2012-02-08 NOTE — Telephone Encounter (Signed)
Call Report: WBC: 18.9 Platelets: 720.  Results printed and given to Dr. Dan Humphreys.

## 2012-02-08 NOTE — Assessment & Plan Note (Signed)
S/p traumatic injury after tire rupture. Per pt, s/p embolism of splenic artery. Hospitalized in ICU for 5 days. Will request records from [facility]. Pt will need immunization with meningitis and pneumonia vaccines, if this has not already been performed.

## 2012-02-08 NOTE — Assessment & Plan Note (Signed)
Patient with panic attacks after recent tragic injury. Will use Xanax as needed for severe episodes of anxiety. Discussed that this medication may be sedating. He'll not drive will taking medication. Followup 2 weeks.

## 2012-02-10 ENCOUNTER — Telehealth: Payer: Self-pay | Admitting: Internal Medicine

## 2012-02-10 DIAGNOSIS — F0781 Postconcussional syndrome: Secondary | ICD-10-CM

## 2012-02-10 NOTE — Telephone Encounter (Signed)
Caller: Judy/Mother; PCP: Ronna Polio; CB#: [phone];  Call regarding Anxiety; Worsening anxiety; was "pacing" in rain at midnight, claustrophic, does not want water to touch face or head, and unable to sleep.  Seen 02/08/12 s/p [facility] hospital admission for traumatic brain injury.  Caller easily frustrated/upset when RN requested summary of issue d/t call last night and lack of sleep.  Concerned that Xanax and Vicodan are stimulating him instead of calming him.  Gave Alleve but concerned about recent abnormal liver function.  Anxiety increases between 1800-1900 q night.  Asking for MD medication recommendations and advise how to handle > anxiety and insomnia. Asking about RX for less expensive hypnotic; Alfonso Patten was too expensive.  Declined triage; triaged for anxiety 02/09/12.  Stopped giving Lopressor; asking if he was to take both Lopressor and Toporol XR.  Looking into getting counseling now.  Info noted and sent to MD for adult with questions about RX medication not covered by available resources per [facility] Question Guideline.   Please call back ASAP 02/10/12 on Judy's cell at [phone].

## 2012-02-10 NOTE — Telephone Encounter (Signed)
Pt may have post-concussive syndrome. Likely needs to be evaluated today. May need admission for severe anxiety. He can use Xanax as prescribed, but if symptoms severe, will need to go to ED. We can also set up psych referral, but this may take several days

## 2012-02-10 NOTE — Telephone Encounter (Signed)
Patients mom Darel Hong advised as instructed via telephone, she stated that patient is willing to see a Psychiatrist in the area.  They will wait to hear from [place].  She stated that patient will go to ED if symptoms worsened or changed.

## 2012-02-14 DIAGNOSIS — IMO0002 Reserved for concepts with insufficient information to code with codable children: Secondary | ICD-10-CM | POA: Insufficient documentation

## 2012-02-14 DIAGNOSIS — H8190 Unspecified disorder of vestibular function, unspecified ear: Secondary | ICD-10-CM | POA: Insufficient documentation

## 2012-02-15 ENCOUNTER — Ambulatory Visit: Payer: 59 | Admitting: Internal Medicine

## 2012-02-15 ENCOUNTER — Other Ambulatory Visit: Payer: 59

## 2012-02-16 ENCOUNTER — Telehealth: Payer: Self-pay | Admitting: Internal Medicine

## 2012-02-16 NOTE — Telephone Encounter (Signed)
Yes, Lowella Bandy - Can you please help set this up? Or see if Carollee Herter can help? This is an urgent referral. Thanks

## 2012-02-16 NOTE — Telephone Encounter (Signed)
Dr Letta Moynahan has had a death in his family, he cannot see patient for a month. If patient needs to be seen earlier he recommends Dr Rosezetta Schlatter in [place] [phone].

## 2012-02-17 NOTE — Telephone Encounter (Signed)
Called Dr. Diego Cory office and left a message for a return call.

## 2012-02-17 NOTE — Telephone Encounter (Signed)
Dr. Rosezetta Schlatter called back stating that he can see patient tomorrow at 4:00.  He asked that I call patient and have patient contact him directly to schedule appt.  Patient advised and he stated that he will call Dr. Diego Cory office to schedule appt.

## 2012-02-17 NOTE — Telephone Encounter (Signed)
Patients wife called back stating insurance will not cover for him to see Dr. Rosezetta Schlatter.  She will call back and see which doctors are covered and let us know.

## 2012-02-19 ENCOUNTER — Ambulatory Visit: Payer: Self-pay | Admitting: Internal Medicine

## 2012-02-19 ENCOUNTER — Encounter: Payer: Self-pay | Admitting: Internal Medicine

## 2012-02-19 ENCOUNTER — Ambulatory Visit (INDEPENDENT_AMBULATORY_CARE_PROVIDER_SITE_OTHER): Payer: 59 | Admitting: Internal Medicine

## 2012-02-19 ENCOUNTER — Telehealth: Payer: Self-pay | Admitting: *Deleted

## 2012-02-19 VITALS — BP 136/110 | HR 112 | Temp 98.9°F | Ht 75.0 in | Wt 286.2 lb

## 2012-02-19 DIAGNOSIS — R7989 Other specified abnormal findings of blood chemistry: Secondary | ICD-10-CM

## 2012-02-19 DIAGNOSIS — D72829 Elevated white blood cell count, unspecified: Secondary | ICD-10-CM

## 2012-02-19 DIAGNOSIS — F0781 Postconcussional syndrome: Secondary | ICD-10-CM

## 2012-02-19 DIAGNOSIS — F4310 Post-traumatic stress disorder, unspecified: Secondary | ICD-10-CM | POA: Insufficient documentation

## 2012-02-19 LAB — COMPREHENSIVE METABOLIC PANEL
ALT: 72 U/L — ABNORMAL HIGH (ref 0–53)
AST: 39 U/L — ABNORMAL HIGH (ref 0–37)
CO2: 25 mEq/L (ref 19–32)
Creatinine, Ser: 1 mg/dL (ref 0.4–1.5)
GFR: 86.81 mL/min (ref >60.00)
Total Bilirubin: 1.3 mg/dL — ABNORMAL HIGH (ref 0.3–1.2)

## 2012-02-19 LAB — CBC WITH DIFFERENTIAL/PLATELET
Basophils Absolute: 0.1 10*3/uL (ref 0.0–0.1)
Basophils Relative: 0.5 % (ref 0.0–3.0)
Eosinophils Relative: 0.9 % (ref 0.0–5.0)
HCT: 43 % (ref 39.0–52.0)
Hemoglobin: 13.9 g/dL (ref 13.0–17.0)
Lymphs Abs: 2.6 10*3/uL (ref 0.7–4.0)
Monocytes Relative: 9.5 % (ref 3.0–12.0)
Neutro Abs: 15.6 10*3/uL — ABNORMAL HIGH (ref 1.4–7.7)
RDW: 14.2 % (ref 11.5–14.6)

## 2012-02-19 MED ORDER — CITALOPRAM HYDROBROMIDE 20 MG PO TABS: 20.0000 mg | ORAL_TABLET | Freq: Every day | ORAL | Status: DC

## 2012-02-19 MED ORDER — LORAZEPAM 0.5 MG PO TABS: 0.5000 mg | ORAL_TABLET | Freq: Two times a day (BID) | ORAL | Status: DC | PRN

## 2012-02-19 NOTE — Assessment & Plan Note (Signed)
Liver function tests were markedly elevated on last check. They have improved today, however bilirubin is up. Will plan to repeat in 2 weeks.

## 2012-02-19 NOTE — Telephone Encounter (Signed)
See result note.  

## 2012-02-19 NOTE — Progress Notes (Signed)
Subjective:    Patient ID: Jared Watkins, male    DOB: 1975/12/21, 36 y.o.   MRN: [id number]  HPI 36 year old male with history of recent traumatic head injury and splenic laceration requiring splenic artery embolization presents for followup. Since his last visit, he was readmitted at [facility] for dizziness. He brings paperwork today that shows he was diagnosed with pneumonia and blood stream infection. However, he was not discharged with antibiotics. He reports continued episodes of shaking chills and sweats. He has not had cough. He reports feeling generally poorly. He reports feeling extremely anxious and tearful at times. He has difficulty walking and some gait instability. He was told to followup with PT after his recent admission but this has not been scheduled yet. At his last visit, we had started Xanax to help with symptoms of anxiety, but he reports minimal improvement with this medication.  Outpatient Encounter Prescriptions as of 02/19/2012  Medication Sig Dispense Refill  . aspirin 81 MG EC tablet Take 81 mg by mouth every other day.       Marland Kitchen atorvastatin (LIPITOR) 20 MG tablet Take 1 tablet (20 mg total) by mouth daily.  90 tablet  2  . diphenhydrAMINE (BENADRYL) 25 MG tablet Take 25 mg by mouth at bedtime as needed.        . meclizine (ANTIVERT) 25 MG tablet Take 25 mg by mouth every 6 (six) hours as needed.      . metoprolol succinate (TOPROL-XL) 25 MG 24 hr tablet Take 1 tablet (25 mg total) by mouth daily.  30 tablet  6  . senna-docusate (SENOKOT-S) 8.6-50 MG per tablet Take 2 tablets by mouth daily.      . vitamin E (GNP VITAMIN E) 400 UNIT capsule Take 1 capsule (400 Units total) by mouth daily.  30 capsule  2  . DISCONTD: ALPRAZolam (XANAX) 0.5 MG tablet Take 1 tablet (0.5 mg total) by mouth 3 (three) times daily as needed for sleep or anxiety.  90 tablet  3  . citalopram (CELEXA) 20 MG tablet Take 1 tablet (20 mg total) by mouth daily.  30 tablet  3  .  HYDROcodone-acetaminophen (LORTAB 7.5) 7.5-500 MG per tablet Take 1 tablet by mouth every 6 (six) hours as needed for pain.  90 tablet  3  . lisinopril-hydrochlorothiazide (PRINZIDE,ZESTORETIC) 20-25 MG per tablet Take 1 tablet by mouth daily.  90 tablet  4  . LORazepam (ATIVAN) 0.5 MG tablet Take 1 tablet (0.5 mg total) by mouth 2 (two) times daily as needed for anxiety.  30 tablet  1  . oxycodone (OXY-IR) 5 MG capsule Take 1-2 tablets by mouth every three hours as needed for pain      . DISCONTD: acetaminophen (TYLENOL) 650 MG CR tablet Take 650 mg by mouth every 6 (six) hours as needed.      Marland Kitchen DISCONTD: metoprolol (LOPRESSOR) 50 MG tablet Take 1 tablet (50 mg total) by mouth 2 (two) times daily. HOLD if Top number on BP is <100 and/or Heart rate <60  60 tablet  1   BP 136/110  Pulse 112  Temp 98.9 F (37.2 C) (Oral)  Ht 6\' 3"  (1.905 m)  Wt 286 lb 4 oz (129.842 kg)  BMI 35.78 kg/m2  SpO2 99%  Review of Systems  Constitutional: Positive for chills, diaphoresis and fatigue. Negative for fever, activity change, appetite change and unexpected weight change.  Eyes: Negative for visual disturbance.  Respiratory: Negative for cough and shortness of  breath.   Cardiovascular: Negative for chest pain, palpitations and leg swelling.  Gastrointestinal: Negative for abdominal pain and abdominal distention.  Genitourinary: Negative for dysuria, urgency and difficulty urinating.  Musculoskeletal: Positive for gait problem. Negative for arthralgias.  Skin: Negative for color change and rash.  Neurological: Positive for weakness.  Hematological: Negative for adenopathy.  Psychiatric/Behavioral: Positive for behavioral problems and agitation. Negative for disturbed wake/sleep cycle and dysphoric mood. The patient is nervous/anxious.        Objective:   Physical Exam  Constitutional: He is oriented to person, place, and time. He appears well-developed and well-nourished. No distress.  HENT:  Head:  Normocephalic and atraumatic.  Right Ear: External ear normal.  Left Ear: External ear normal.  Nose: Nose normal.  Mouth/Throat: Oropharynx is clear and moist. No oropharyngeal exudate.  Eyes: Conjunctivae and EOM are normal. Pupils are equal, round, and reactive to light. Right eye exhibits no discharge. Left eye exhibits no discharge. No scleral icterus.  Neck: Normal range of motion. Neck supple. No tracheal deviation present. No thyromegaly present.  Cardiovascular: Normal rate, regular rhythm and normal heart sounds.  Exam reveals no gallop and no friction rub.   No murmur heard. Pulmonary/Chest: Effort normal and breath sounds normal. No respiratory distress. He has no wheezes. He has no rales. He exhibits no tenderness.  Musculoskeletal: Normal range of motion. He exhibits no edema.  Lymphadenopathy:    He has no cervical adenopathy.  Neurological: He is alert and oriented to person, place, and time. No cranial nerve deficit or sensory deficit. Gait abnormal. Coordination normal.  Skin: Skin is warm and dry. No rash noted. He is not diaphoretic. No erythema. No pallor.  Psychiatric: His speech is normal. Judgment and thought content normal. His mood appears anxious. He is withdrawn. Cognition and memory are normal.          Assessment & Plan:

## 2012-02-19 NOTE — Assessment & Plan Note (Addendum)
Patient with anxiety, difficulty sleeping, difficulty concentrating, and gait abnormality which are characteristic of post concussive syndrome and posttraumatic stress. Working on addressing anxiety issues. Will set patient up with physical therapy. Will also look into resources available locally for neurology follow up.

## 2012-02-19 NOTE — Assessment & Plan Note (Addendum)
Patient having significant anxiety related to recent genetic injury. Will add Celexa and change to Ativan as needed for severe episodes of anxiety. We are also working on establishing patient with [employer].

## 2012-02-19 NOTE — Assessment & Plan Note (Addendum)
Patient noted to have marked leukocytosis. He was discharged from [facility] with paperwork stating that he was diagnosed with pneumonia however he was not discharged on antibiotics. He continues to have sweats and chills. Will get repeat chest x-ray. We'll also get records from [place]. Consider repeat CT of the abdomen given persistent leukocytosis and recent abdominal surgery.

## 2012-02-19 NOTE — Telephone Encounter (Signed)
Tonya from [facility] called with critical- white count of 20.5. Verbal also given to Dr. Dan Humphreys

## 2012-02-19 NOTE — Patient Instructions (Signed)
Post-Traumatic Stress Disorder  If you have been diagnosed with post-traumatic stress disorder (PTSD), you have probably experienced a traumatic event in your life. These events are usually outside of the range of normal human experience and would negatively impact any normal person.   CAUSES   A person can get PTSD after living through or seeing a dangerous event such as:   An automobile accident.   War.   Natural disaster.   Rape.   Domestic violence.   Any event where there has been a threat to life.  PTSD is a real illness. PTSD Can happen to anyone at any age. Children get PTSD too. A doctor, or mental health professional with experience in treating PTSD can help you.  SYMPTOMS   Not all symptoms may be present in any one person.   Distressing dreams.   Flashback: feeling the frightening event is happening again.   Avoiding activities, places, and people that remind you of the event.   Avoiding thoughts and feelings associated with the event.   Having frightening thoughts you cannot control.   Feeling on the edge with increased alertness and vigilance.   Trouble sleeping.   Feeling alone, detached from others.   Angry outbursts.   Feeling worried, guilty, or sad.   Having thoughts of hurting yourself or others.  PTSD may start soon after a frightening event or months or years later. Many war veterans have PTSD. Drinking alcohol or using drugs will not help PTSD and may even make it worse.   TREATMENT   PTSD can be treated. Treatment may include "talk" therapy, medicine, or both. Either a doctor or a mental health professional who is experienced in treating PTSD can help you. Early diagnosis and treatment is best and can show more rapid improvement. Get help if you or a loved one are thinking of hurting yourself. Call your local emergency medical services if you need help immediately.  Document Released: 04/21/2001 Document Revised: 07/16/2011 Document Reviewed: 04/04/2008  [employer] Patient  Information 2012 [employer].

## 2012-02-22 ENCOUNTER — Telehealth: Payer: Self-pay | Admitting: Internal Medicine

## 2012-02-22 NOTE — Telephone Encounter (Signed)
Left message on cell phone voicemail advising patient as instructed, advised him that we will be in contact with him regarding CT scan date and time.

## 2012-02-22 NOTE — Telephone Encounter (Signed)
CXR was normal. I would like to proceed with CT abdomen to try to further identify what is leading to elevated WBC.

## 2012-02-25 ENCOUNTER — Ambulatory Visit (INDEPENDENT_AMBULATORY_CARE_PROVIDER_SITE_OTHER): Payer: 59 | Admitting: Neurology

## 2012-02-25 ENCOUNTER — Encounter: Payer: Self-pay | Admitting: Neurology

## 2012-02-25 VITALS — BP 126/86 | HR 76 | Wt 284.0 lb

## 2012-02-25 DIAGNOSIS — F0781 Postconcussional syndrome: Secondary | ICD-10-CM
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
## 2012-02-26 ENCOUNTER — Telehealth: Payer: Self-pay | Admitting: *Deleted

## 2012-02-26 NOTE — Telephone Encounter (Signed)
Per Dr. Tilman Neat request I called patient to see if he had CT scan done or had it scheduled and I called his cell number and got no answer, left message for him to return call.

## 2012-02-29 NOTE — Telephone Encounter (Signed)
Left message on cell phone voicemail for patient to return call. 

## 2012-03-01 NOTE — Telephone Encounter (Signed)
Spoke with patients spouse and she stated that patient has had several CT scans done at [facility] and she would like for [place] to get the records from them so he won't have to repeat a test that was already done.  Release of information form faxed to [facility] requesting all imaging, labs, and office visit notes for the past four months.

## 2012-03-03 NOTE — Telephone Encounter (Signed)
I have faxed release of information form to [facility] several times and I am still waiting on them to fax [place] the information.

## 2012-03-11 ENCOUNTER — Encounter: Payer: Self-pay | Admitting: Internal Medicine

## 2012-03-11 ENCOUNTER — Ambulatory Visit (INDEPENDENT_AMBULATORY_CARE_PROVIDER_SITE_OTHER): Payer: 59 | Admitting: Internal Medicine

## 2012-03-11 VITALS — BP 132/90 | HR 87 | Temp 98.2°F | Ht 75.0 in | Wt 283.5 lb

## 2012-03-11 DIAGNOSIS — K7581 Nonalcoholic steatohepatitis (NASH): Secondary | ICD-10-CM

## 2012-03-11 DIAGNOSIS — F4310 Post-traumatic stress disorder, unspecified: Secondary | ICD-10-CM

## 2012-03-11 DIAGNOSIS — K219 Gastro-esophageal reflux disease without esophagitis: Secondary | ICD-10-CM

## 2012-03-11 DIAGNOSIS — F0781 Postconcussional syndrome: Secondary | ICD-10-CM

## 2012-03-11 DIAGNOSIS — Z23 Encounter for immunization: Secondary | ICD-10-CM

## 2012-03-11 DIAGNOSIS — K7689 Other specified diseases of liver: Secondary | ICD-10-CM

## 2012-03-11 DIAGNOSIS — D72829 Elevated white blood cell count, unspecified: Secondary | ICD-10-CM

## 2012-03-11 LAB — CBC WITH DIFFERENTIAL/PLATELET
Eosinophils Absolute: 0.2 10*3/uL (ref 0.0–0.7)
Eosinophils Relative: 2 % (ref 0–5)
HCT: 40.6 % (ref 39.0–52.0)
Hemoglobin: 13.7 g/dL (ref 13.0–17.0)
Lymphs Abs: 3 10*3/uL (ref 0.7–4.0)
MCH: 28.5 pg (ref 26.0–34.0)
MCV: 84.4 fL (ref 78.0–100.0)
Monocytes Absolute: 0.9 10*3/uL (ref 0.1–1.0)
Monocytes Relative: 8 % (ref 3–12)
RBC: 4.81 MIL/uL (ref 4.22–5.81)

## 2012-03-11 MED ORDER — OMEPRAZOLE 40 MG PO CPDR: 40.0000 mg | DELAYED_RELEASE_CAPSULE | Freq: Every day | ORAL | Status: DC

## 2012-03-12 DIAGNOSIS — K219 Gastro-esophageal reflux disease without esophagitis: Secondary | ICD-10-CM | POA: Insufficient documentation

## 2012-03-12 LAB — COMPREHENSIVE METABOLIC PANEL
CO2: 26 mEq/L (ref 19–32)
Calcium: 9.9 mg/dL (ref 8.4–10.5)
Chloride: 99 mEq/L (ref 96–112)
Creat: 0.99 mg/dL (ref 0.50–1.35)
Glucose, Bld: 81 mg/dL (ref 70–99)
Total Bilirubin: 0.6 mg/dL (ref 0.3–1.2)

## 2012-03-12 NOTE — Assessment & Plan Note (Signed)
Symptoms of anxiety are improved with use of Celexa and Ativan. Patient has followup with psychiatry next week. We'll continue to monitor.

## 2012-03-12 NOTE — Assessment & Plan Note (Signed)
Symptoms of postconcussive syndrome including abnormal gait, decreased concentration, and anxiety are all gradually improving. Will continue to monitor.

## 2012-03-12 NOTE — Assessment & Plan Note (Signed)
Likely secondary to splenic artery embolization. White blood cell count has significantly improved compared to previous. No evidence of infection such as focal symptoms or fever, chills. We'll continue to monitor with repeat CBC in 3 months.

## 2012-03-12 NOTE — Assessment & Plan Note (Signed)
Symptoms consistent with GERD. Will add prilosec to see if any improvement.

## 2012-03-12 NOTE — Assessment & Plan Note (Signed)
Elevated LFTs are relatively stable. Will continue to monitor in 3 months. Encourage patient to continue with efforts at healthy her diet and weight loss.
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
## 2012-03-14 ENCOUNTER — Encounter: Payer: Self-pay | Admitting: Internal Medicine

## 2012-03-14 ENCOUNTER — Telehealth: Payer: Self-pay | Admitting: Internal Medicine

## 2012-03-14 NOTE — Telephone Encounter (Signed)
Geraldine Contras called has ? On meds  And mr Robart received a call on his cell phone.  He can not listen to his message please let dee know

## 2012-03-14 NOTE — Telephone Encounter (Signed)
I have already spoken to Mr.Koerber regarding this earlier today.

## 2012-03-17 ENCOUNTER — Other Ambulatory Visit: Payer: Self-pay | Admitting: *Deleted

## 2012-03-17 MED ORDER — AMOXICILL-CLARITHRO-LANSOPRAZ PO MISC: 1.0000 | Freq: Two times a day (BID) | ORAL | Status: DC

## 2012-04-10 ENCOUNTER — Encounter: Payer: Self-pay | Admitting: Internal Medicine

## 2012-04-14 ENCOUNTER — Ambulatory Visit: Payer: 59 | Admitting: Internal Medicine

## 2012-04-22 ENCOUNTER — Ambulatory Visit: Payer: 59 | Admitting: Internal Medicine

## 2012-04-27 ENCOUNTER — Ambulatory Visit: Payer: 59 | Admitting: Internal Medicine

## 2012-07-04 ENCOUNTER — Ambulatory Visit: Payer: 59 | Admitting: Internal Medicine

## 2012-07-11 ENCOUNTER — Ambulatory Visit: Payer: 59 | Admitting: Internal Medicine

## 2012-08-12 ENCOUNTER — Telehealth: Payer: Self-pay | Admitting: Internal Medicine

## 2012-08-12 NOTE — Telephone Encounter (Signed)
Spoke with pt's wife she faxed over some forms that are needing to be filled out for insurance purposes (I put them in your box up front). I told her that her husband may need to come in for a 30 min visit for this. She said that was ok just let her know what they needed to do. They are having to pay out of pocket for everything per their insurance until this form gets filled out. She asks if we could have this ready for them as soon as possible. I put this form in your inbox up front.

## 2012-08-15 NOTE — Telephone Encounter (Signed)
Patient advised via telephone, appt scheduled for 08/24/2012 at 10:30.

## 2012-08-15 NOTE — Telephone Encounter (Signed)
This pt will need an appointment, as they were asking for information including labs that will need to be drawn.

## 2012-08-24 ENCOUNTER — Ambulatory Visit (INDEPENDENT_AMBULATORY_CARE_PROVIDER_SITE_OTHER): Payer: 59 | Admitting: Internal Medicine

## 2012-08-24 ENCOUNTER — Encounter: Payer: Self-pay | Admitting: *Deleted

## 2012-08-24 ENCOUNTER — Telehealth: Payer: Self-pay | Admitting: *Deleted

## 2012-08-24 ENCOUNTER — Encounter: Payer: Self-pay | Admitting: Internal Medicine

## 2012-08-24 VITALS — BP 164/120 | HR 81 | Temp 98.1°F | Ht 75.0 in | Wt 266.5 lb

## 2012-08-24 DIAGNOSIS — E669 Obesity, unspecified: Secondary | ICD-10-CM

## 2012-08-24 DIAGNOSIS — Z23 Encounter for immunization: Secondary | ICD-10-CM

## 2012-08-24 DIAGNOSIS — I10 Essential (primary) hypertension: Secondary | ICD-10-CM

## 2012-08-24 DIAGNOSIS — Z0000 Encounter for general adult medical examination without abnormal findings: Secondary | ICD-10-CM

## 2012-08-24 LAB — CBC WITH DIFFERENTIAL/PLATELET
Basophils Relative: 0.3 % (ref 0.0–3.0)
Eosinophils Absolute: 0.1 10*3/uL (ref 0.0–0.7)
Hemoglobin: 15.7 g/dL (ref 13.0–17.0)
MCHC: 33.6 g/dL (ref 30.0–36.0)
MCV: 86.5 fl (ref 78.0–100.0)
Monocytes Absolute: 0.6 10*3/uL (ref 0.1–1.0)
Neutro Abs: 7 10*3/uL (ref 1.4–7.7)
RBC: 5.39 Mil/uL (ref 4.22–5.81)

## 2012-08-24 LAB — COMPREHENSIVE METABOLIC PANEL
ALT: 48 U/L (ref 0–53)
CO2: 27 mEq/L (ref 19–32)
Chloride: 105 mEq/L (ref 96–112)
GFR: 92.77 mL/min (ref >60.00)
Sodium: 136 mEq/L (ref 135–145)
Total Bilirubin: 0.7 mg/dL (ref 0.3–1.2)
Total Protein: 7.5 g/dL (ref 6.0–8.3)

## 2012-08-24 LAB — LIPID PANEL: Total CHOL/HDL Ratio: 4

## 2012-08-24 MED ORDER — AMLODIPINE BESYLATE 5 MG PO TABS: 5.0000 mg | ORAL_TABLET | Freq: Every day | ORAL | Status: DC

## 2012-08-24 NOTE — Patient Instructions (Signed)
Stop Metoprolol. Start Amlodipine 5mg  daily. Please email with blood pressure readings. Follow up 1 month.

## 2012-08-24 NOTE — Assessment & Plan Note (Signed)
Body mass index is 33.31 kg/(m^2).  Congratulated pt on 20lb weight loss. Encouraged him to continue healthy diet and regular physical activity.

## 2012-08-24 NOTE — Assessment & Plan Note (Signed)
BP elevated today, however pt not taking Metoprolol because of fatigue with this medication. Will try changing to Amlodipine and continue Lisinopril-HCTZ. Follow up 1 month. Encouraged pt to email with BP readings from home in the interim.

## 2012-08-24 NOTE — Assessment & Plan Note (Signed)
General medical exam normal today. Congratulated pt on nearly 20lb weight loss. Encouraged him to continue effort at healthy diet choices and regular physical activity.  Will check CBC, CMP, lipids with labs today. Flu vaccine today. Follow up 6 months and prn.

## 2012-08-24 NOTE — Progress Notes (Signed)
  Subjective:    Patient ID: Jared Watkins, male    DOB: March 10, 1976, 37 y.o.   MRN: [id number]  HPI 37 year old male with history of obesity, non-alcoholic steatohepatitis, hypertension, and posttraumatic stress disorder presents for general medical exam. Doing well. Has been lost to follow up for several months. Reports he is back at work full-time and feeling well. Has been eating healthier diet and much more physically active. Has lost 20lbs. No further issues with fatigue, post-concussive syndrome. Stopped celexa and alprazolam.  Has not been taking Metoprolol because it makes him feel tired. Taking Atorvastatin and Lisinopril, as well as daily aspirin. No recent illnesses.  Outpatient Encounter Prescriptions as of 08/24/2012  Medication Sig Dispense Refill  . aspirin 81 MG EC tablet Take 81 mg by mouth every other day.       Marland Kitchen atorvastatin (LIPITOR) 20 MG tablet Take 1 tablet (20 mg total) by mouth daily.  90 tablet  2  . lisinopril-hydrochlorothiazide (PRINZIDE,ZESTORETIC) 20-25 MG per tablet Take 1 tablet by mouth daily.  90 tablet  4   BP 164/120  Pulse 81  Temp 98.1 F (36.7 C) (Oral)  Ht 6\' 3"  (1.905 m)  Wt 266 lb 8 oz (120.884 kg)  BMI 33.31 kg/m2  SpO2 96%  Review of Systems  Constitutional: Negative for fever, chills, activity change, appetite change, fatigue and unexpected weight change.  Eyes: Negative for visual disturbance.  Respiratory: Negative for cough and shortness of breath.   Cardiovascular: Negative for chest pain, palpitations and leg swelling.  Gastrointestinal: Negative for abdominal pain and abdominal distention.  Genitourinary: Negative for dysuria, urgency and difficulty urinating.  Musculoskeletal: Negative for arthralgias and gait problem.  Skin: Negative for color change and rash.  Hematological: Negative for adenopathy.  Psychiatric/Behavioral: Negative for sleep disturbance and dysphoric mood. The patient is not nervous/anxious.        Objective:     Physical Exam  Constitutional: He is oriented to person, place, and time. He appears well-developed and well-nourished. No distress.  HENT:  Head: Normocephalic and atraumatic.  Right Ear: External ear normal.  Left Ear: External ear normal.  Nose: Nose normal.  Mouth/Throat: Oropharynx is clear and moist. No oropharyngeal exudate.  Eyes: Conjunctivae normal and EOM are normal. Pupils are equal, round, and reactive to light. Right eye exhibits no discharge. Left eye exhibits no discharge. No scleral icterus.  Neck: Normal range of motion. Neck supple. No tracheal deviation present. No thyromegaly present.  Cardiovascular: Normal rate, regular rhythm and normal heart sounds.  Exam reveals no gallop and no friction rub.   No murmur heard. Pulmonary/Chest: Effort normal and breath sounds normal. No respiratory distress. He has no wheezes. He has no rales. He exhibits no tenderness.  Abdominal: Soft. Bowel sounds are normal. He exhibits no distension. There is no tenderness.  Musculoskeletal: Normal range of motion. He exhibits no edema.  Lymphadenopathy:    He has no cervical adenopathy.  Neurological: He is alert and oriented to person, place, and time. No cranial nerve deficit. Coordination normal.  Skin: Skin is warm and dry. No rash noted. He is not diaphoretic. No erythema. No pallor.  Psychiatric: He has a normal mood and affect. His behavior is normal. Judgment and thought content normal.          Assessment & Plan:

## 2012-08-25 ENCOUNTER — Ambulatory Visit: Payer: 59

## 2012-08-25 DIAGNOSIS — R7309 Other abnormal glucose: Secondary | ICD-10-CM

## 2012-08-25 LAB — HEMOGLOBIN A1C: Hgb A1c MFr Bld: 5.5 % (ref 4.6–6.5)

## 2012-08-29 ENCOUNTER — Other Ambulatory Visit: Payer: 59

## 2012-08-30 ENCOUNTER — Telehealth: Payer: Self-pay | Admitting: *Deleted

## 2012-08-30 ENCOUNTER — Other Ambulatory Visit: Payer: 59

## 2012-08-30 NOTE — Telephone Encounter (Signed)
What labs and dx would you like for this pt?  

## 2012-08-31 NOTE — Telephone Encounter (Signed)
Open in error

## 2012-09-20 ENCOUNTER — Telehealth: Payer: Self-pay | Admitting: *Deleted
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
## 2012-09-20 NOTE — Telephone Encounter (Signed)
Message copied by Florene Glen on Tue Sep 20, 2012  3:28 PM ------      Message from: Annett Fabian      Created: Tue Sep 20, 2012  2:51 PM       Patient only received 2 of the Twinrix injections please see if he needs to finish the series ------

## 2012-09-27 ENCOUNTER — Ambulatory Visit: Payer: 59 | Admitting: Internal Medicine

## 2012-10-03 ENCOUNTER — Encounter: Payer: Self-pay | Admitting: Internal Medicine

## 2012-10-05 ENCOUNTER — Telehealth: Payer: Self-pay | Admitting: *Deleted

## 2012-10-05 NOTE — Telephone Encounter (Signed)
Pt missed his 3rd TWIN REX injection. I have no record, but I had called Dr [facility]il[facility] to ask if he could get it there on his 08/24/12 OV in [place]. Pt received his Flu Vaccine, but I see no record of it being given.

## 2012-10-05 NOTE — Telephone Encounter (Signed)
Message copied by Florene Glen on Wed Oct 05, 2012  8:23 AM ------      Message from: Florene Glen      Created: Wed Sep 21, 2012  9:08 AM       Ck on twin rex at dr [facility]. ------

## 2012-12-13 NOTE — Telephone Encounter (Signed)
Discussed with Dr. Rhea Belton unable to reach the patient .  We will wait for the patient to contact us for follow up

## 2012-12-13 NOTE — Telephone Encounter (Signed)
Dr. Rhea Belton, patient only had first two of 4  TwinRix. Should we start the series over, or have him come for labs and check for immunity?  He has not kept follow up with his primary care MD.  There has been no follow up here in one year.

## 2012-12-13 NOTE — Telephone Encounter (Signed)
Patient was a no show at the last office visit with Dr. Dan Humphreys.  See additional phone note from 09/20/12 for additional info on this

## 2012-12-13 NOTE — Telephone Encounter (Signed)
My inclination would be 1 give 1 additional booster injection with Hep A and B, and then plan to document immunity with Hep B surg Ab and Hep A total 2-3 months thereafter. He has not kept follow-up with me either.  I am happy to see him again if he is interested

## 2013-02-07 ENCOUNTER — Telehealth: Payer: Self-pay | Admitting: Internal Medicine

## 2013-02-07 NOTE — Telephone Encounter (Signed)
Patient Information:  Caller Name: Cardell Peach  Phone: [phone]  Patient: Jared Watkins, Jared Watkins  Gender: Male  DOB: August 29, 1975  Age: 37 Years  PCP: Ronna Polio (Adults only)  Office Follow Up:  Does the office need to follow up with this patient?: Yes  Instructions For The Office: Called to make Appt.   Appt not available in office for today 7/1, patient declines to go to ER for check. Please review, contact wife [phone]   or patient  [phone] for Appt time or direction for care.   Symptoms  Reason For Call & Symptoms: Sick for 2 days, severe cough.  Heavy breathing, wheezing with difficulty breathing during last night 6/30 when he felt like something stuck in throat but has not swallowed anything that got caught.  Throat Sx a bit less today than last night.  Reviewed Health History In EMR: Yes  Reviewed Medications In EMR: Yes  Reviewed Allergies In EMR: Yes  Reviewed Surgeries / Procedures: Yes  Date of Onset of Symptoms: 02/05/2013  Guideline(s) Used:  Cough  Disposition Per Guideline:   Go to Office Now  Reason For Disposition Reached:   Wheezing is present  Advice Given:  N/A  Patient Will Follow Care Advice:  YES

## 2013-02-07 NOTE — Telephone Encounter (Signed)
Spoke with pt, advised pt that he should be seen in Urgent Care or ED tonight for symptoms, pt declined. Appt schedule for tomorrow with Dr. Dan Humphreys. Advised to go to ED if symptoms worsen before appointment tomorrow.

## 2013-02-07 NOTE — Telephone Encounter (Signed)
Rn attempted to return call with phone going to voice mail.  An identified message left to return call if need is still present.

## 2013-02-07 NOTE — Telephone Encounter (Signed)
Please call patient. He should go to the ED or urgent care if seeking care tonight. Otherwise, we can see him in clinic tomorrow.

## 2013-02-08 ENCOUNTER — Ambulatory Visit (INDEPENDENT_AMBULATORY_CARE_PROVIDER_SITE_OTHER): Payer: 59 | Admitting: Internal Medicine

## 2013-02-08 ENCOUNTER — Encounter: Payer: Self-pay | Admitting: Internal Medicine

## 2013-02-08 VITALS — BP 160/110 | HR 86 | Temp 98.1°F | Wt 278.0 lb

## 2013-02-08 DIAGNOSIS — R5381 Other malaise: Secondary | ICD-10-CM

## 2013-02-08 DIAGNOSIS — G4733 Obstructive sleep apnea (adult) (pediatric): Secondary | ICD-10-CM

## 2013-02-08 DIAGNOSIS — R5383 Other fatigue: Secondary | ICD-10-CM | POA: Insufficient documentation

## 2013-02-08 DIAGNOSIS — I10 Essential (primary) hypertension: Secondary | ICD-10-CM

## 2013-02-08 DIAGNOSIS — F4310 Post-traumatic stress disorder, unspecified: Secondary | ICD-10-CM

## 2013-02-08 MED ORDER — LORAZEPAM 0.5 MG PO TABS: 0.5000 mg | ORAL_TABLET | Freq: Two times a day (BID) | ORAL | Status: DC | PRN

## 2013-02-08 MED ORDER — AMLODIPINE BESYLATE 5 MG PO TABS: 5.0000 mg | ORAL_TABLET | Freq: Every day | ORAL | Status: DC

## 2013-02-08 MED ORDER — LORAZEPAM 0.5 MG PO TABS: 0.5000 mg | ORAL_TABLET | Freq: Three times a day (TID) | ORAL | Status: DC | PRN

## 2013-02-08 MED ORDER — LISINOPRIL-HYDROCHLOROTHIAZIDE 20-25 MG PO TABS: 1.0000 | ORAL_TABLET | Freq: Every day | ORAL | Status: DC

## 2013-02-08 NOTE — Progress Notes (Signed)
Subjective:    Patient ID: Jared Watkins, male    DOB: [birth year], 37 y.o.   MRN: [id number]  HPI 37 year old male with history of posttraumatic stress disorder, hypertension, hyperlipidemia, sleep apnea, and splenic rupture status post splenectomy presents for acute visit. He reports over the last several weeks he has had increased anxiety. He has run out of lorazepam. He notes some episodes of acute onset of shortness of breath and feeling of panic. He denies any recent stressors and is unsure why he is feeling this way. His wife has noted that he occasionally seems to be wheezing or gasp for breath while sleeping. He is not currently wearing CPAP. He has not been checking his blood pressure. He denies any chest pain.  Outpatient Encounter Prescriptions as of 02/08/2013  Medication Sig Dispense Refill  . amLODipine (NORVASC) 5 MG tablet Take 1 tablet (5 mg total) by mouth daily.  90 tablet  3  . aspirin 81 MG EC tablet Take 81 mg by mouth every other day.       . lisinopril-hydrochlorothiazide (PRINZIDE,ZESTORETIC) 20-25 MG per tablet Take 1 tablet by mouth daily.  90 tablet  3  . LORazepam (ATIVAN) 0.5 MG tablet Take 1 tablet (0.5 mg total) by mouth every 8 (eight) hours as needed for anxiety.  90 tablet  1   No facility-administered encounter medications on file as of 02/08/2013.   BP 160/110  Pulse 86  Temp(Src) 98.1 F (36.7 C) (Oral)  Wt 278 lb (126.1 kg)  BMI 34.75 kg/m2  SpO2 98%  Review of Systems  Constitutional: Positive for fatigue. Negative for fever, chills, activity change, appetite change and unexpected weight change.  Eyes: Negative for visual disturbance.  Respiratory: Positive for shortness of breath. Negative for cough.   Cardiovascular: Negative for chest pain, palpitations and leg swelling.  Gastrointestinal: Negative for abdominal pain and abdominal distention.  Genitourinary: Negative for dysuria, urgency and difficulty urinating.  Musculoskeletal: Negative for  arthralgias and gait problem.  Skin: Negative for color change and rash.  Hematological: Negative for adenopathy.  Psychiatric/Behavioral: Negative for suicidal ideas, sleep disturbance and dysphoric mood. The patient is nervous/anxious.        Objective:   Physical Exam  Constitutional: He is oriented to person, place, and time. He appears well-developed and well-nourished. No distress.  HENT:  Head: Normocephalic and atraumatic.  Right Ear: External ear normal.  Left Ear: External ear normal.  Nose: Nose normal.  Mouth/Throat: Oropharynx is clear and moist. No oropharyngeal exudate.  Eyes: Conjunctivae and EOM are normal. Pupils are equal, round, and reactive to light. Right eye exhibits no discharge. Left eye exhibits no discharge. No scleral icterus.  Neck: Normal range of motion. Neck supple. No tracheal deviation present. No thyromegaly present.  Cardiovascular: Normal rate, regular rhythm and normal heart sounds.  Exam reveals no gallop and no friction rub.   No murmur heard. Pulmonary/Chest: Effort normal and breath sounds normal. No respiratory distress. He has no wheezes. He has no rales. He exhibits no tenderness.  Musculoskeletal: Normal range of motion. He exhibits no edema.  Lymphadenopathy:    He has no cervical adenopathy.  Neurological: He is alert and oriented to person, place, and time. No cranial nerve deficit. Coordination normal.  Skin: Skin is warm and dry. No rash noted. He is not diaphoretic. No erythema. No pallor.  Psychiatric: His speech is normal. Judgment and thought content normal. His mood appears anxious. He is agitated. Cognition and memory are normal.  Assessment & Plan:

## 2013-02-08 NOTE — Assessment & Plan Note (Signed)
Symptoms are most consistent with PTSD and panic attacks. Discussed restarting a daily medication but he would prefer to use medication only as needed. Will continue lorazepam as this has worked well for him. Followup in 2-4 weeks or sooner as needed.

## 2013-02-08 NOTE — Assessment & Plan Note (Signed)
BP Readings from Last 3 Encounters:  02/08/13 160/110  08/24/12 164/120  03/11/12 132/90   Blood pressure markedly elevated today. Patient has not been compliant with medication. Encouraged better compliance. Refilled both lisinopril hydrochlorothiazide and amlodipine. Followup in 2-4 weeks.

## 2013-02-08 NOTE — Assessment & Plan Note (Signed)
Reviewed previous sleep study which showed mild obstructive sleep apnea. Will request results on CPAP titration study and set up CPAP as necessary.

## 2013-02-09 LAB — CBC WITH DIFFERENTIAL/PLATELET
Basophils Absolute: 0 10*3/uL (ref 0.0–0.1)
Eosinophils Absolute: 0.2 10*3/uL (ref 0.0–0.7)
Eosinophils Relative: 1.6 % (ref 0.0–5.0)
MCHC: 34.1 g/dL (ref 30.0–36.0)
MCV: 87.4 fl (ref 78.0–100.0)
Monocytes Absolute: 0.2 10*3/uL (ref 0.1–1.0)
Neutrophils Relative %: 79.3 % — ABNORMAL HIGH (ref 43.0–77.0)
Platelets: 284 10*3/uL (ref 150.0–400.0)
RDW: 13.8 % (ref 11.5–14.6)
WBC: 13.6 10*3/uL — ABNORMAL HIGH (ref 4.5–10.5)

## 2013-02-09 LAB — COMPREHENSIVE METABOLIC PANEL
ALT: 44 U/L (ref 0–53)
Albumin: 4.1 g/dL (ref 3.5–5.2)
Alkaline Phosphatase: 120 U/L — ABNORMAL HIGH (ref 39–117)
Glucose, Bld: 84 mg/dL (ref 70–99)
Potassium: 4.4 mEq/L (ref 3.5–5.1)
Sodium: 140 mEq/L (ref 135–145)
Total Protein: 7.1 g/dL (ref 6.0–8.3)

## 2013-02-09 LAB — VITAMIN B12: Vitamin B-12: 270 pg/mL (ref 211–911)

## 2013-02-09 LAB — TSH: TSH: 0.92 u[IU]/mL (ref 0.35–5.50)

## 2013-02-13 NOTE — Addendum Note (Signed)
Addended by: Ronna Polio A on: 02/13/2013 01:19 PM   Modules accepted: Orders

## 2013-03-31 ENCOUNTER — Telehealth: Payer: Self-pay | Admitting: Internal Medicine

## 2013-03-31 NOTE — Telephone Encounter (Signed)
Patient dropped off health screening form.  Pt stated he may need to get labs  Please advise Pt also stated he need to turn this back in by 8/29 In box

## 2013-03-31 NOTE — Telephone Encounter (Signed)
We should make a visit to address this next week.

## 2013-03-31 NOTE — Telephone Encounter (Signed)
Fwd to Dr. Walker 

## 2013-04-03 NOTE — Telephone Encounter (Signed)
See below

## 2013-04-04 ENCOUNTER — Ambulatory Visit (INDEPENDENT_AMBULATORY_CARE_PROVIDER_SITE_OTHER): Payer: 59 | Admitting: Adult Health

## 2013-04-04 ENCOUNTER — Encounter: Payer: Self-pay | Admitting: Adult Health

## 2013-04-04 VITALS — BP 140/90 | HR 82 | Temp 98.5°F | Resp 12 | Ht 73.5 in | Wt 275.0 lb

## 2013-04-04 DIAGNOSIS — Z0000 Encounter for general adult medical examination without abnormal findings: Secondary | ICD-10-CM

## 2013-04-04 DIAGNOSIS — I10 Essential (primary) hypertension: Secondary | ICD-10-CM

## 2013-04-04 LAB — BASIC METABOLIC PANEL
GFR: 104.82 mL/min (ref >60.00)
Glucose, Bld: 95 mg/dL (ref 70–99)
Potassium: 4.1 mEq/L (ref 3.5–5.1)
Sodium: 136 mEq/L (ref 135–145)

## 2013-04-04 LAB — LIPID PANEL: HDL: 49 mg/dL (ref >39.00)

## 2013-04-04 NOTE — Patient Instructions (Addendum)
  Your blood pressure is a little elevated.  You take amlodipine 5mg  daily. I want you to take 2 tablets to make it 10 mg to see if this improves your blood pressure.  If it does improve then we will send in a prescription for 10 mg

## 2013-04-04 NOTE — Telephone Encounter (Signed)
Pt seen 8/26

## 2013-04-04 NOTE — Progress Notes (Signed)
  Subjective:    Patient ID: Jared Watkins, male    DOB: Jul 30, 1976, 37 y.o.   MRN: [id number]  HPI  Jared Watkins is a 37 y/o male with history of obesity, hypertension, cardiomegaly, Nash, OSA who presents for a physical including blood work required for insurance purposes. He brings with him the required forms to be filled out. He denies any concerns this morning. He reports a good appetite, restful night, denies any pain or discomfort. He denies problems with bowels or bladder. Overall he is feeling fine.  Current Outpatient Prescriptions on File Prior to Visit  Medication Sig Dispense Refill  . amLODipine (NORVASC) 5 MG tablet Take 1 tablet (5 mg total) by mouth daily.  90 tablet  3  . aspirin 81 MG EC tablet Take 81 mg by mouth every other day.       . lisinopril-hydrochlorothiazide (PRINZIDE,ZESTORETIC) 20-25 MG per tablet Take 1 tablet by mouth daily.  90 tablet  3   No current facility-administered medications on file prior to visit.    Review of Systems  Constitutional: Negative.   HENT: Negative.   Eyes: Negative.   Respiratory: Negative.   Cardiovascular: Negative.   Gastrointestinal: Negative.   Endocrine: Negative.   Genitourinary: Negative.   Musculoskeletal: Negative.   Skin: Negative.   Allergic/Immunologic: Negative.   Neurological: Negative.   Hematological: Negative.   Psychiatric/Behavioral: Negative for behavioral problems, confusion, sleep disturbance, self-injury and agitation. The patient is not nervous/anxious.        Objective:   Physical Exam  Constitutional: He is oriented to person, place, and time.  Overweight 37 year old in no apparent distress  HENT:  Head: Normocephalic and atraumatic.  Right Ear: External ear normal.  Left Ear: External ear normal.  Eyes: Conjunctivae and EOM are normal. Pupils are equal, round, and reactive to light.  Neck: Normal range of motion. Neck supple.  Cardiovascular: Normal rate, regular rhythm, normal heart sounds  and intact distal pulses.  Exam reveals no gallop and no friction rub.   No murmur heard. Pulmonary/Chest: Effort normal and breath sounds normal. No respiratory distress. He has no wheezes. He has no rales.  Abdominal: Bowel sounds are normal.  Musculoskeletal: Normal range of motion. He exhibits no edema and no tenderness.  Neurological: He is alert and oriented to person, place, and time.  Skin: Skin is dry.  Psychiatric: He has a normal mood and affect. His behavior is normal. Judgment and thought content normal.          Assessment & Plan:

## 2013-04-04 NOTE — Assessment & Plan Note (Signed)
Blood pressure is slightly elevated. She is currently on amlodipine 5 mg. Will increase to 10 mg to see if this improves his blood pressure. He is also on lisinopril/hydrochlorothiazide. He is to notify us if medication improves blood pressure and no side effects. We will then order 10 mg amlodipine tablets.

## 2013-04-04 NOTE — Assessment & Plan Note (Addendum)
Medical exam including labs required for insurance purposes. Physical exam was normal. Labs ordered: Lipid panel, basic metabolic panel, hemoglobin A1c, cotinine. Forms will be completed once lab results are available. Patient will be notified once ready to be picked up.

## 2013-04-05 ENCOUNTER — Telehealth: Payer: Self-pay | Admitting: *Deleted

## 2013-04-05 LAB — HEMOGLOBIN A1C: Hgb A1c MFr Bld: 5.6 % (ref 4.6–6.5)

## 2013-04-05 LAB — NICOTINE/COTININE METABOLITES: Cotinine: <10 ng/mL

## 2013-04-05 NOTE — Addendum Note (Signed)
Addended by: Montine Circle D on: 04/05/2013 10:36 AM   Modules accepted: Orders

## 2013-04-05 NOTE — Telephone Encounter (Signed)
Called pt to come back to get the A1c collect, he stated he would come in today

## 2013-04-11 ENCOUNTER — Encounter: Payer: Self-pay | Admitting: Emergency Medicine

## 2013-07-25 ENCOUNTER — Telehealth: Payer: Self-pay | Admitting: Internal Medicine

## 2013-07-25 DIAGNOSIS — I10 Essential (primary) hypertension: Secondary | ICD-10-CM

## 2013-07-25 DIAGNOSIS — K219 Gastro-esophageal reflux disease without esophagitis: Secondary | ICD-10-CM

## 2013-07-25 MED ORDER — OMEPRAZOLE 40 MG PO CPDR: 40.0000 mg | DELAYED_RELEASE_CAPSULE | Freq: Every day | ORAL | Status: DC

## 2013-07-25 MED ORDER — AMLODIPINE BESYLATE 5 MG PO TABS: 5.0000 mg | ORAL_TABLET | Freq: Every day | ORAL | Status: DC

## 2013-07-25 NOTE — Telephone Encounter (Signed)
amLODipine (NORVASC) 5 MG tablet #90  Omeprazole DR 40mg  cap #90

## 2013-07-25 NOTE — Telephone Encounter (Signed)
Rx sent to pharmacy by escript  

## 2013-08-08 ENCOUNTER — Telehealth: Payer: Self-pay | Admitting: Internal Medicine

## 2013-08-08 DIAGNOSIS — K219 Gastro-esophageal reflux disease without esophagitis: Secondary | ICD-10-CM

## 2013-08-08 DIAGNOSIS — I10 Essential (primary) hypertension: Secondary | ICD-10-CM

## 2013-08-08 NOTE — Telephone Encounter (Signed)
The patient has had a financial mix up with his medication from his mail order company. He is needing his amlodipine 5 mg 30 day supply he takes this medicine  twice daily and his omeprazole 40 mg 30 day supply sent to [facility] [address]

## 2013-08-11 MED ORDER — AMLODIPINE BESYLATE 5 MG PO TABS: 5.0000 mg | ORAL_TABLET | Freq: Every day | ORAL | Status: DC

## 2013-08-11 MED ORDER — OMEPRAZOLE 40 MG PO CPDR: 40.0000 mg | DELAYED_RELEASE_CAPSULE | Freq: Every day | ORAL | Status: DC

## 2013-08-11 NOTE — Telephone Encounter (Signed)
Prescriptions sent to [facility].

## 2013-08-30 ENCOUNTER — Encounter: Payer: Self-pay | Admitting: Adult Health

## 2013-08-30 ENCOUNTER — Encounter (INDEPENDENT_AMBULATORY_CARE_PROVIDER_SITE_OTHER): Payer: Self-pay

## 2013-08-30 ENCOUNTER — Ambulatory Visit (INDEPENDENT_AMBULATORY_CARE_PROVIDER_SITE_OTHER): Payer: 59 | Admitting: Adult Health

## 2013-08-30 VITALS — BP 110/82 | HR 118 | Temp 98.0°F | Resp 16 | Wt 276.0 lb

## 2013-08-30 DIAGNOSIS — J329 Chronic sinusitis, unspecified: Secondary | ICD-10-CM | POA: Insufficient documentation

## 2013-08-30 MED ORDER — AMOXICILLIN-POT CLAVULANATE 875-125 MG PO TABS: 1.0000 | ORAL_TABLET | Freq: Two times a day (BID) | ORAL | Status: DC

## 2013-08-30 MED ORDER — FLUTICASONE PROPIONATE 50 MCG/ACT NA SUSP: 2.0000 | Freq: Every day | NASAL | Status: DC

## 2013-08-30 NOTE — Progress Notes (Signed)
   Subjective:    Patient ID: Jared Watkins, male    DOB: [birth year], 38 y.o.   MRN: [id number]  HPI  Pt is a 38 y/o male who presents to clinic with his mother. He reports having sinus congestion, some dried blood, pressure. He has had symptoms for ~ 2 weeks.   Current Outpatient Prescriptions on File Prior to Visit  Medication Sig Dispense Refill  . amLODipine (NORVASC) 5 MG tablet Take 1 tablet (5 mg total) by mouth daily.  30 tablet  0  . aspirin 81 MG EC tablet Take 81 mg by mouth every other day.       Marland Kitchen atorvastatin (LIPITOR) 20 MG tablet Take 20 mg by mouth daily.      Marland Kitchen lisinopril-hydrochlorothiazide (PRINZIDE,ZESTORETIC) 20-25 MG per tablet Take 1 tablet by mouth daily.  90 tablet  3  . omeprazole (PRILOSEC) 40 MG capsule Take 1 capsule (40 mg total) by mouth daily.  30 capsule  0   No current facility-administered medications on file prior to visit.      Review of Systems  Constitutional: Negative for fever and chills.  HENT: Positive for congestion, nosebleeds, postnasal drip and sinus pressure.   Respiratory: Positive for cough.   Cardiovascular: Negative.        Objective:   Physical Exam  Constitutional: He is oriented to person, place, and time.  Appears acutely ill  HENT:  Head: Normocephalic and atraumatic.  Right Ear: External ear normal.  Left Ear: External ear normal.  Mouth/Throat: No oropharyngeal exudate.  Pharyngeal erythema  Cardiovascular: Normal rate, regular rhythm and normal heart sounds.  Exam reveals no gallop.   No murmur heard. Pulmonary/Chest: Effort normal and breath sounds normal. No respiratory distress. He has no wheezes. He has no rales.  Lymphadenopathy:    He has no cervical adenopathy.  Neurological: He is alert and oriented to person, place, and time.  Skin: Skin is warm and dry.  Psychiatric: He has a normal mood and affect. His behavior is normal. Judgment and thought content normal.          Assessment & Plan:

## 2013-08-30 NOTE — Assessment & Plan Note (Signed)
Start Augmentin twice a day x10 days. Flonase 2 sprays into each nostril daily. Tylenol for fever and general body discomfort. Return to clinic if symptoms are not improved within 4-5 days.

## 2013-08-30 NOTE — Patient Instructions (Signed)
  Start Augmentin 1 tablet twice a day for 10 days.  Also use Flonase nasal spray 2 sprays into each nostril once a day.  Take Tylenol for general body discomfort. Avoid using ibuprofen as this tends to elevate your blood pressure.  Call if her symptoms are not improved within 4-5 days.

## 2013-08-30 NOTE — Progress Notes (Signed)
Pre visit review using our clinic review tool, if applicable. No additional management support is needed unless otherwise documented below in the visit note. 

## 2013-11-15 ENCOUNTER — Other Ambulatory Visit: Payer: Self-pay | Admitting: Internal Medicine

## 2013-12-06 ENCOUNTER — Encounter: Payer: Self-pay | Admitting: Family Medicine

## 2013-12-06 ENCOUNTER — Ambulatory Visit (INDEPENDENT_AMBULATORY_CARE_PROVIDER_SITE_OTHER): Payer: 59 | Admitting: Family Medicine

## 2013-12-06 ENCOUNTER — Telehealth: Payer: Self-pay | Admitting: Internal Medicine

## 2013-12-06 VITALS — BP 140/90 | HR 100 | Temp 98.3°F | Wt 287.0 lb

## 2013-12-06 DIAGNOSIS — M7050 Other bursitis of knee, unspecified knee: Secondary | ICD-10-CM

## 2013-12-06 DIAGNOSIS — M76899 Other specified enthesopathies of unspecified lower limb, excluding foot: Secondary | ICD-10-CM

## 2013-12-06 MED ORDER — DOXYCYCLINE HYCLATE 100 MG PO TABS: 100.0000 mg | ORAL_TABLET | Freq: Two times a day (BID) | ORAL | Status: DC

## 2013-12-06 MED ORDER — CEPHALEXIN 500 MG PO CAPS: 500.0000 mg | ORAL_CAPSULE | Freq: Four times a day (QID) | ORAL | Status: DC

## 2013-12-06 NOTE — Patient Instructions (Signed)
Start the antibiotics today.  Ice your knee.  Elevated your leg, avoid bending, kneeling.  Out of work for now.  If fever, spreading redness, or severe pain, then go to the ER.  Update me in 1-2 days.  Take care.

## 2013-12-06 NOTE — Telephone Encounter (Signed)
Spoke with Marlowe Kays from [facility], advised to be seen in ED for symptoms. FYI

## 2013-12-06 NOTE — Progress Notes (Signed)
Pre visit review using our clinic review tool, if applicable. No additional management support is needed unless otherwise documented below in the visit note.  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  More L knee pain in the meantime, worse since yesterday.  No FCNAVD.  Pain with movement, less pain at rest, anterior but not posterior pain.  No new R knee sx.  L knee more puffy at the day goes on today.  L ankle puffy.  Usually wears steel toe boots.  No hip pain.  Meds, vitals, and allergies reviewed.   ROS: See HPI.  Otherwise, noncontributory.  nad ncat Mmm rrr ctab B knees with calluses at the patella.   L infrapatellar bursitis noted, with thickened skin.  Not red, mildly ttp.  No drainage.  Skin thick but not red.

## 2013-12-06 NOTE — Telephone Encounter (Signed)
Patient Information:  Caller Name: Jared Watkins  Phone: [phone]  Patient: Jared, Watkins  Gender: Male  DOB: 1976-01-29  Age: 38 Years  PCP: Ronette Deter (Adults only)  Office Follow Up:  Does the office need to follow up with this patient?: Yes  Instructions For The Office: Swollen painful Left leg  RN Note:  Wife calling regarding [name] with c/o left leg swelling & painful from Knee down.  Denies recent injury. On 12/05/13 leg was red and hot, treated with Aleeve and elevation.  Denies redness or heat now.  Symptoms  Reason For Call & Symptoms: left leg swollen from knee down  Reviewed Health History In EMR: Yes  Reviewed Medications In EMR: Yes  Reviewed Allergies In EMR: Yes  Reviewed Surgeries / Procedures: Yes  Date of Onset of Symptoms: 12/03/2013  Treatments Tried: aleeve  Treatments Tried Worked: Yes  Guideline(s) Used:  Knee Pain  Disposition Per Guideline:   Go to ED Now (or to Office with PCP Approval)  Reason For Disposition Reached:   Thigh or calf swelling and only 1 side  Advice Given:  N/A  RN Overrode Recommendation:  Follow Up With Office Later  No appointments available.  Please advise ED disposition or work - in visit in office

## 2013-12-06 NOTE — Telephone Encounter (Signed)
Pt spoke with Almyra Free CMA, appt was scheduled at [facility] office today.

## 2013-12-06 NOTE — Telephone Encounter (Signed)
Called pt, no one answered and unable to leave a message on the answering machine

## 2013-12-06 NOTE — Telephone Encounter (Signed)
Pt refused to go to the ED, scheduled appt with Dr. Damita Dunnings today

## 2013-12-06 NOTE — Telephone Encounter (Signed)
Returned call to spouse and advised Patient/Jared Watkins would need be evaluated at ED for Left calf swelling and pain.  Wife refuses ED disposition stating "He will not go to the emergency department." Requesting a callback regarding possible appointment for 12/07/13.

## 2013-12-06 NOTE — Assessment & Plan Note (Signed)
Doesn't appear infected, but risk of infection higher with patellar bursitis.  Would start doxy and have him notify us if not better.  Didn't aspirate due to the risk from thickened skin.  D/w pt. He agrees. F/u PCP prn. Out of work in the meantime.

## 2013-12-07 ENCOUNTER — Ambulatory Visit: Payer: 59 | Admitting: Family Medicine

## 2014-01-05 ENCOUNTER — Ambulatory Visit (INDEPENDENT_AMBULATORY_CARE_PROVIDER_SITE_OTHER): Payer: 59 | Admitting: Internal Medicine

## 2014-01-05 ENCOUNTER — Encounter: Payer: Self-pay | Admitting: Internal Medicine

## 2014-01-05 VITALS — BP 152/102 | HR 100 | Temp 98.4°F | Ht 73.5 in | Wt 291.2 lb

## 2014-01-05 DIAGNOSIS — S81809A Unspecified open wound, unspecified lower leg, initial encounter: Secondary | ICD-10-CM

## 2014-01-05 DIAGNOSIS — M7050 Other bursitis of knee, unspecified knee: Secondary | ICD-10-CM

## 2014-01-05 DIAGNOSIS — I10 Essential (primary) hypertension: Secondary | ICD-10-CM

## 2014-01-05 DIAGNOSIS — S81009A Unspecified open wound, unspecified knee, initial encounter: Secondary | ICD-10-CM

## 2014-01-05 DIAGNOSIS — F4310 Post-traumatic stress disorder, unspecified: Secondary | ICD-10-CM

## 2014-01-05 DIAGNOSIS — S91009A Unspecified open wound, unspecified ankle, initial encounter: Secondary | ICD-10-CM

## 2014-01-05 DIAGNOSIS — M76899 Other specified enthesopathies of unspecified lower limb, excluding foot: Secondary | ICD-10-CM

## 2014-01-05 DIAGNOSIS — R5381 Other malaise: Secondary | ICD-10-CM

## 2014-01-05 DIAGNOSIS — R5383 Other fatigue: Secondary | ICD-10-CM

## 2014-01-05 DIAGNOSIS — J329 Chronic sinusitis, unspecified: Secondary | ICD-10-CM

## 2014-01-05 LAB — CBC WITH DIFFERENTIAL/PLATELET
BASOS PCT: 0 % (ref 0–1)
Basophils Absolute: 0 10*3/uL (ref 0.0–0.1)
Eosinophils Absolute: 0.1 10*3/uL (ref 0.0–0.7)
Eosinophils Relative: 1 % (ref 0–5)
HCT: 41.7 % (ref 39.0–52.0)
Hemoglobin: 14.7 g/dL (ref 13.0–17.0)
Lymphocytes Relative: 27 % (ref 12–46)
Lymphs Abs: 2.8 10*3/uL (ref 0.7–4.0)
MCH: 29.6 pg (ref 26.0–34.0)
MCHC: 35.3 g/dL (ref 30.0–36.0)
MCV: 84.1 fL (ref 78.0–100.0)
Monocytes Absolute: 0.8 10*3/uL (ref 0.1–1.0)
Monocytes Relative: 8 % (ref 3–12)
NEUTROS ABS: 6.7 10*3/uL (ref 1.7–7.7)
NEUTROS PCT: 64 % (ref 43–77)
PLATELETS: 346 10*3/uL (ref 150–400)
RBC: 4.96 MIL/uL (ref 4.22–5.81)
RDW: 13.3 % (ref 11.5–15.5)
WBC: 10.5 10*3/uL (ref 4.0–10.5)

## 2014-01-05 MED ORDER — LEVOFLOXACIN 750 MG PO TABS: 750.0000 mg | ORAL_TABLET | Freq: Every day | ORAL | Status: AC

## 2014-01-05 MED ORDER — LORAZEPAM 0.5 MG PO TABS: 0.5000 mg | ORAL_TABLET | Freq: Three times a day (TID) | ORAL | Status: AC | PRN

## 2014-01-05 NOTE — Assessment & Plan Note (Signed)
BP Readings from Last 3 Encounters:  01/05/14 152/102  12/06/13 140/90  08/30/13 110/82   BP elevated today. Pt reports compliance with meds. Will check renal function with labs today. Plan to repeat BP check in 2 weeks.

## 2014-01-05 NOTE — Assessment & Plan Note (Signed)
Doing well. Will continue prn Lorazepam.

## 2014-01-05 NOTE — Progress Notes (Signed)
Subjective:    Patient ID: Jared Watkins, male    DOB: 1976-07-13, 38 y.o.   MRN: [id number]  HPI 38YO male presents for follow up.  Sinus congestion - pain and sinus congestion for about 5 days. Thick mucous out of nose. No fever. Pain in teeth. No cough, dyspnea.  Knee bursitis - Seen by Dr. Damita Dunnings and treated with doxycycline. Knee swelling has been persistent. Mild pain at site. Would like to follow up with ortho.  Laceration - Right lower leg, cut with chain saw last Thursday. Applying neosporin. Never sought care for this. Denies pain at site. No fever, chills.  PTSD - Uses occasional Ativan with improvement in symptoms. No longer taking Citalopram.    Review of Systems  Constitutional: Negative for fever, chills, activity change, appetite change, fatigue and unexpected weight change.  HENT: Positive for congestion and sinus pressure. Negative for sore throat, trouble swallowing and voice change.   Eyes: Negative for visual disturbance.  Respiratory: Negative for cough and shortness of breath.   Cardiovascular: Negative for chest pain, palpitations and leg swelling.  Gastrointestinal: Negative for abdominal pain and abdominal distention.  Genitourinary: Negative for dysuria, urgency and difficulty urinating.  Musculoskeletal: Positive for joint swelling. Negative for arthralgias and gait problem.  Skin: Positive for color change and wound. Negative for rash.  Hematological: Negative for adenopathy.  Psychiatric/Behavioral: Negative for sleep disturbance and dysphoric mood. The patient is nervous/anxious.        Objective:    BP 152/102  Pulse 100  Temp(Src) 98.4 F (36.9 C) (Oral)  Ht 6' 1.5" (1.867 m)  Wt 291 lb 4 oz (132.11 kg)  BMI 37.90 kg/m2  SpO2 97% Physical Exam  Constitutional: He is oriented to person, place, and time. He appears well-developed and well-nourished. No distress.  HENT:  Head: Normocephalic and atraumatic.  Right Ear: External ear normal.  Tympanic membrane is not erythematous and not bulging. A middle ear effusion is present.  Left Ear: External ear normal. Tympanic membrane is not erythematous and not bulging. A middle ear effusion is present.  Nose: Right sinus exhibits maxillary sinus tenderness. Left sinus exhibits maxillary sinus tenderness.  Mouth/Throat: Oropharynx is clear and moist. No oropharyngeal exudate.  Eyes: Conjunctivae and EOM are normal. Pupils are equal, round, and reactive to light. Right eye exhibits no discharge. Left eye exhibits no discharge. No scleral icterus.  Neck: Normal range of motion. Neck supple. No tracheal deviation present. No thyromegaly present.  Cardiovascular: Normal rate, regular rhythm and normal heart sounds.  Exam reveals no gallop and no friction rub.   No murmur heard. Pulmonary/Chest: Effort normal and breath sounds normal. No accessory muscle usage. Not tachypneic. No respiratory distress. He has no decreased breath sounds. He has no wheezes. He has no rhonchi. He has no rales. He exhibits no tenderness.  Musculoskeletal: Normal range of motion. He exhibits no edema.       Legs: Lymphadenopathy:    He has no cervical adenopathy.  Neurological: He is alert and oriented to person, place, and time. No cranial nerve deficit. Coordination normal.  Skin: Skin is warm and dry. No rash noted. He is not diaphoretic. No erythema. No pallor.     Psychiatric: He has a normal mood and affect. His behavior is normal. Judgment and thought content normal.          Assessment & Plan:   Problem List Items Addressed This Visit     Unprioritized   Hypertension  BP Readings from Last 3 Encounters:  01/05/14 152/102  12/06/13 140/90  08/30/13 110/82   BP elevated today. Pt reports compliance with meds. Will check renal function with labs today. Plan to repeat BP check in 2 weeks.    Knee bursitis - Primary     Minimal improvement with Doxycycline. Will set up evaluation with ortho  for drainage.    Relevant Orders      Ambulatory referral to Orthopedic Surgery   Open wound of leg     Open wound right leg at site of laceration by chain saw. We discussed that wound will need to heal by secondary intention. Encouraged him to use topical antibiotic and Tefla. Recommended referral to wound clinic, he declines. Recheck in 2 weeks and prn.    Other malaise and fatigue     Feeling fatigued recently. Will check CBC, CMP, TSH with labs today. Follow up in 4 weeks. Consider sleep study if lab evaluation is normal.    Relevant Orders      CBC with Differential      Comprehensive metabolic panel      Lipid panel      Vit D  25 hydroxy (rtn osteoporosis monitoring)      TSH      B12   Post traumatic stress disorder     Doing well. Will continue prn Lorazepam.     Relevant Medications      LORazepam (ATIVAN) tablet   Sinusitis     Symptoms consistent with maxillary sinusitis. Will start Levaquin. Ibuprofen prn. Follow up 4 weeks and prn.    Relevant Medications      levofloxacin (LEVAQUIN) tablet       Return in about 2 weeks (around 01/19/2014) for Recheck.

## 2014-01-05 NOTE — Assessment & Plan Note (Signed)
Minimal improvement with Doxycycline. Will set up evaluation with ortho for drainage.

## 2014-01-05 NOTE — Progress Notes (Signed)
Pre visit review using our clinic review tool, if applicable. No additional management support is needed unless otherwise documented below in the visit note. 

## 2014-01-05 NOTE — Patient Instructions (Signed)
Start Levaquin 750mg  daily to treat sinus infection.  Labs today.  Monitor wound on right leg closely. Call immediately if any increased redness or pain.  Follow up in 2 weeks.

## 2014-01-05 NOTE — Assessment & Plan Note (Signed)
Symptoms consistent with maxillary sinusitis. Will start Levaquin. Ibuprofen prn. Follow up 4 weeks and prn.

## 2014-01-05 NOTE — Assessment & Plan Note (Signed)
Open wound right leg at site of laceration by chain saw. We discussed that wound will need to heal by secondary intention. Encouraged him to use topical antibiotic and Tefla. Recommended referral to wound clinic, he declines. Recheck in 2 weeks and prn.

## 2014-01-05 NOTE — Assessment & Plan Note (Signed)
Feeling fatigued recently. Will check CBC, CMP, TSH with labs today. Follow up in 4 weeks. Consider sleep study if lab evaluation is normal.

## 2014-01-06 LAB — COMPREHENSIVE METABOLIC PANEL
ALT: 67 U/L — AB (ref 0–53)
AST: 43 U/L — ABNORMAL HIGH (ref 0–37)
Albumin: 5 g/dL (ref 3.5–5.2)
Alkaline Phosphatase: 123 U/L — ABNORMAL HIGH (ref 39–117)
BUN: 18 mg/dL (ref 6–23)
CHLORIDE: 98 meq/L (ref 96–112)
CO2: 28 mEq/L (ref 19–32)
Calcium: 9.6 mg/dL (ref 8.4–10.5)
Creat: 0.93 mg/dL (ref 0.50–1.35)
Glucose, Bld: 95 mg/dL (ref 70–99)
Potassium: 4.6 mEq/L (ref 3.5–5.3)
Sodium: 141 mEq/L (ref 135–145)
Total Bilirubin: 0.5 mg/dL (ref 0.2–1.2)
Total Protein: 7.7 g/dL (ref 6.0–8.3)

## 2014-01-06 LAB — LIPID PANEL
CHOL/HDL RATIO: 6 ratio
CHOLESTEROL: 264 mg/dL — AB (ref 0–200)
HDL: 44 mg/dL (ref >39)
Triglycerides: 463 mg/dL — ABNORMAL HIGH (ref <150)

## 2014-01-06 LAB — VITAMIN D 25 HYDROXY (VIT D DEFICIENCY, FRACTURES): Vit D, 25-Hydroxy: 53 ng/mL (ref 30–89)

## 2014-01-06 LAB — TSH: TSH: 0.604 u[IU]/mL (ref 0.350–4.500)

## 2014-01-06 LAB — VITAMIN B12: Vitamin B-12: 571 pg/mL (ref 211–911)

## 2014-01-09 ENCOUNTER — Telehealth: Payer: Self-pay | Admitting: Internal Medicine

## 2014-01-09 NOTE — Telephone Encounter (Signed)
We just checked his blood sugar on 5/29 and it was normal. We can add him for a 34min visit tomorrow at 1pm if he would like.

## 2014-01-09 NOTE — Telephone Encounter (Signed)
Patient Information:  Caller Name: Bethena Roys  Phone: [phone]  Patient: Jared Watkins, Jared Watkins  Gender: Male  DOB: Aug 24, 1975  Age: 38 Years  PCP: Ronette Deter (Adults only)  Office Follow Up:  Does the office need to follow up with this patient?: Yes  Instructions For The Office: Please advise if there are any work in appointments today.   Symptoms  Reason For Call & Symptoms: Pt was treated for bursitis in his knee with Dr. Damita Dunnings with Doxycycline. Pt was seen on 01/05/14 wtih Levaquin for sinusitis. Pt is phoning today to say he doesn't feel well at all. He is drinking water non-stop and was up urinating 8x last night. He is worried about diabetes. His father has diabetes.  Reviewed Health History In EMR: Yes  Reviewed Medications In EMR: Yes  Reviewed Allergies In EMR: Yes  Reviewed Surgeries / Procedures: Yes  Date of Onset of Symptoms: 01/07/2014  Guideline(s) Used:  Urination Pain - Male  Disposition Per Guideline:   See Today in Office  Reason For Disposition Reached:   All other males with painful urination, or patient wants to be seen  Advice Given:  N/A  Patient Will Follow Care Advice:  YES

## 2014-01-09 NOTE — Telephone Encounter (Signed)
Called pt, no answer, unable to leave a vm due to vm not set up

## 2014-01-11 NOTE — Progress Notes (Signed)
Notified pt and scheduled an appt to discuss

## 2014-01-11 NOTE — Telephone Encounter (Signed)
Pt states that he stopped all his medications and feels better.  Scheduled an appt to discuss this and recent lab results

## 2014-01-17 ENCOUNTER — Ambulatory Visit: Payer: 59 | Admitting: Internal Medicine

## 2014-01-22 ENCOUNTER — Ambulatory Visit: Payer: 59 | Admitting: Internal Medicine

## 2014-01-22 ENCOUNTER — Encounter: Payer: Self-pay | Admitting: Internal Medicine

## 2014-03-14 ENCOUNTER — Other Ambulatory Visit: Payer: Self-pay | Admitting: Internal Medicine

## 2014-03-27 ENCOUNTER — Telehealth: Payer: Self-pay | Admitting: *Deleted

## 2014-03-27 NOTE — Telephone Encounter (Signed)
Fine to fill. 

## 2014-03-27 NOTE — Telephone Encounter (Signed)
Pt wife, Karena Addison, called asking for a refill for Lorazepam.  Medication is not on current medication list. Pt wife asked me not to contact him when Rx is ready, instead she wants [place] to contact her at her work number. Okay to refill?

## 2014-03-28 MED ORDER — LORAZEPAM 0.5 MG PO TABS: 0.5000 mg | ORAL_TABLET | Freq: Two times a day (BID) | ORAL | Status: DC | PRN

## 2014-03-28 NOTE — Telephone Encounter (Signed)
Faxed to pharmacy

## 2014-05-22 ENCOUNTER — Emergency Department: Payer: Self-pay | Admitting: Emergency Medicine

## 2014-06-08 ENCOUNTER — Telehealth: Payer: Self-pay | Admitting: Internal Medicine

## 2014-06-08 NOTE — Telephone Encounter (Signed)
Pt wife called to check the status of ehealth screening form that was faxed yesterday to be filled out. She stated that the form needs to be faxed today and if there is a problem to call her before five today.msn

## 2014-06-08 NOTE — Telephone Encounter (Signed)
Per Dr Gilford Rile, Pt will need an office visit.  Pt needed form completed today, advised pt's wife that the form will have to be completed by an urgent care clinic since we have no available appt

## 2014-07-17 ENCOUNTER — Encounter (INDEPENDENT_AMBULATORY_CARE_PROVIDER_SITE_OTHER): Payer: Self-pay

## 2014-07-17 ENCOUNTER — Ambulatory Visit (INDEPENDENT_AMBULATORY_CARE_PROVIDER_SITE_OTHER): Payer: 59 | Admitting: Nurse Practitioner

## 2014-07-17 ENCOUNTER — Encounter: Payer: Self-pay | Admitting: Nurse Practitioner

## 2014-07-17 VITALS — BP 120/82 | HR 94 | Resp 14 | Ht 73.5 in | Wt 327.0 lb

## 2014-07-17 DIAGNOSIS — I10 Essential (primary) hypertension: Secondary | ICD-10-CM

## 2014-07-17 DIAGNOSIS — E669 Obesity, unspecified: Secondary | ICD-10-CM

## 2014-07-17 DIAGNOSIS — Z23 Encounter for immunization: Secondary | ICD-10-CM

## 2014-07-17 DIAGNOSIS — K219 Gastro-esophageal reflux disease without esophagitis: Secondary | ICD-10-CM

## 2014-07-17 MED ORDER — ATORVASTATIN CALCIUM 20 MG PO TABS: 20.0000 mg | ORAL_TABLET | Freq: Every day | ORAL | Status: DC

## 2014-07-17 MED ORDER — OMEPRAZOLE 40 MG PO CPDR: 40.0000 mg | DELAYED_RELEASE_CAPSULE | Freq: Every day | ORAL | Status: DC

## 2014-07-17 MED ORDER — AMLODIPINE BESYLATE 5 MG PO TABS: 5.0000 mg | ORAL_TABLET | Freq: Every day | ORAL | Status: DC

## 2014-07-17 NOTE — Patient Instructions (Addendum)
Ordered Necessary Medications that you have described as taking currently.   Lorazepam was faxed to pharmacy in August.   Thank you for getting the flu shot today.   Follow up as needed.
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
## 2014-07-17 NOTE — Assessment & Plan Note (Signed)
Controlled on Omeprazole 40 mg daily.

## 2014-07-17 NOTE — Assessment & Plan Note (Addendum)
BP excellent today. Stable on medications. Exercising every other day by walking on treadmill. Discussed weight loss strategies. Refills of Amlodipine. Lisinopril-HCTZ is still with refill I believe.  Refilled Lipitor.

## 2014-07-17 NOTE — Assessment & Plan Note (Signed)
Patient's BMI is now 38. Pt is + 36 lbs since May. He enjoys cooking and eating. Discussed healthier eating like looking at portions and choosing healthier foods.

## 2014-07-17 NOTE — Addendum Note (Signed)
Addended by: Wynonia Lawman E on: 07/17/2014 01:51 PM   Modules accepted: Orders

## 2014-07-17 NOTE — Progress Notes (Signed)
Pre visit review using our clinic review tool, if applicable. No additional management support is needed unless otherwise documented below in the visit note. 

## 2014-07-17 NOTE — Assessment & Plan Note (Signed)
Flu shot today 

## 2014-07-19 ENCOUNTER — Telehealth: Payer: Self-pay | Admitting: Internal Medicine

## 2014-07-19 NOTE — Telephone Encounter (Signed)
emmi mailed  °

## 2014-08-15 ENCOUNTER — Other Ambulatory Visit: Payer: Self-pay | Admitting: *Deleted

## 2014-08-15 ENCOUNTER — Telehealth: Payer: Self-pay | Admitting: Internal Medicine

## 2014-08-15 MED ORDER — LISINOPRIL-HYDROCHLOROTHIAZIDE 20-25 MG PO TABS: 1.0000 | ORAL_TABLET | Freq: Every day | ORAL | Status: DC

## 2014-08-15 NOTE — Telephone Encounter (Signed)
lisinopril-hydrochlorothiazide (PRINZIDE,ZESTORETIC) 20-25 MG per tablet

## 2014-08-30 ENCOUNTER — Other Ambulatory Visit: Payer: Self-pay | Admitting: Nurse Practitioner

## 2014-11-19 ENCOUNTER — Telehealth: Payer: Self-pay | Admitting: Internal Medicine

## 2014-11-19 NOTE — Telephone Encounter (Signed)
Pt wife called to request an rx or appt for gout. Please advise where to add pt to schedule or if rx can be sent in /msn

## 2014-11-20 ENCOUNTER — Ambulatory Visit (INDEPENDENT_AMBULATORY_CARE_PROVIDER_SITE_OTHER): Payer: 59 | Admitting: Internal Medicine

## 2014-11-20 ENCOUNTER — Encounter: Payer: Self-pay | Admitting: Internal Medicine

## 2014-11-20 VITALS — BP 130/90 | HR 97 | Temp 98.2°F | Ht 73.5 in | Wt 318.4 lb

## 2014-11-20 DIAGNOSIS — I10 Essential (primary) hypertension: Secondary | ICD-10-CM | POA: Diagnosis not present

## 2014-11-20 DIAGNOSIS — M109 Gout, unspecified: Secondary | ICD-10-CM | POA: Diagnosis not present

## 2014-11-20 DIAGNOSIS — E785 Hyperlipidemia, unspecified: Secondary | ICD-10-CM | POA: Diagnosis not present

## 2014-11-20 LAB — LIPID PANEL
CHOL/HDL RATIO: 5
Cholesterol: 187 mg/dL (ref 0–200)
HDL: 37.7 mg/dL — ABNORMAL LOW (ref >39.00)
LDL Cholesterol: 118 mg/dL — ABNORMAL HIGH (ref 0–99)
NONHDL: 149.3
TRIGLYCERIDES: 156 mg/dL — AB (ref 0.0–149.0)
VLDL: 31.2 mg/dL (ref 0.0–40.0)

## 2014-11-20 LAB — COMPREHENSIVE METABOLIC PANEL
ALT: 51 U/L (ref 0–53)
AST: 36 U/L (ref 0–37)
Albumin: 4.3 g/dL (ref 3.5–5.2)
Alkaline Phosphatase: 113 U/L (ref 39–117)
BUN: 18 mg/dL (ref 6–23)
CALCIUM: 9.7 mg/dL (ref 8.4–10.5)
CO2: 30 meq/L (ref 19–32)
CREATININE: 0.94 mg/dL (ref 0.40–1.50)
Chloride: 102 mEq/L (ref 96–112)
GFR: 95.04 mL/min (ref >60.00)
Glucose, Bld: 94 mg/dL (ref 70–99)
Potassium: 4.2 mEq/L (ref 3.5–5.1)
Sodium: 136 mEq/L (ref 135–145)
Total Bilirubin: 0.6 mg/dL (ref 0.2–1.2)
Total Protein: 7.2 g/dL (ref 6.0–8.3)

## 2014-11-20 LAB — URIC ACID: Uric Acid, Serum: 8.1 mg/dL — ABNORMAL HIGH (ref 4.0–7.8)

## 2014-11-20 MED ORDER — COLCHICINE 0.6 MG PO TABS: ORAL_TABLET | ORAL | Status: DC

## 2014-11-20 MED ORDER — PREDNISONE 10 MG PO TABS: ORAL_TABLET | ORAL | Status: DC

## 2014-11-20 NOTE — Assessment & Plan Note (Signed)
BP Readings from Last 3 Encounters:  11/20/14 130/90  07/17/14 120/82  01/05/14 152/102   BP mildly elevated today. Will check renal function with labs. Continue Amlodipine and Lisinopril-HCTZ. Discussed that HCTZ may contribute to risk for gout flare. May need to consider alternative med in future.

## 2014-11-20 NOTE — Patient Instructions (Addendum)
Start Prednisone taper and start Colchicine as directed for gout flare.   Gout Gout is an inflammatory arthritis caused by a buildup of uric acid crystals in the joints. Uric acid is a chemical that is normally present in the blood. When the level of uric acid in the blood is too high it can form crystals that deposit in your joints and tissues. This causes joint redness, soreness, and swelling (inflammation). Repeat attacks are common. Over time, uric acid crystals can form into masses (tophi) near a joint, destroying bone and causing disfigurement. Gout is treatable and often preventable. CAUSES  The disease begins with elevated levels of uric acid in the blood. Uric acid is produced by your body when it breaks down a naturally found substance called purines. Certain foods you eat, such as meats and fish, contain high amounts of purines. Causes of an elevated uric acid level include:  Being passed down from parent to child (heredity).  Diseases that cause increased uric acid production (such as obesity, psoriasis, and certain cancers).  Excessive alcohol use.  Diet, especially diets rich in meat and seafood.  Medicines, including certain cancer-fighting medicines (chemotherapy), water pills (diuretics), and aspirin.  Chronic kidney disease. The kidneys are no longer able to remove uric acid well.  Problems with metabolism. Conditions strongly associated with gout include:  Obesity.  High blood pressure.  High cholesterol.  Diabetes. Not everyone with elevated uric acid levels gets gout. It is not understood why some people get gout and others do not. Surgery, joint injury, and eating too much of certain foods are some of the factors that can lead to gout attacks. SYMPTOMS   An attack of gout comes on quickly. It causes intense pain with redness, swelling, and warmth in a joint.  Fever can occur.  Often, only one joint is involved. Certain joints are more commonly  involved:  Base of the big toe.  Knee.  Ankle.  Wrist.  Finger. Without treatment, an attack usually goes away in a few days to weeks. Between attacks, you usually will not have symptoms, which is different from many other forms of arthritis. DIAGNOSIS  Your caregiver will suspect gout based on your symptoms and exam. In some cases, tests may be recommended. The tests may include:  Blood tests.  Urine tests.  X-rays.  Joint fluid exam. This exam requires a needle to remove fluid from the joint (arthrocentesis). Using a microscope, gout is confirmed when uric acid crystals are seen in the joint fluid. TREATMENT  There are two phases to gout treatment: treating the sudden onset (acute) attack and preventing attacks (prophylaxis).  Treatment of an Acute Attack.  Medicines are used. These include anti-inflammatory medicines or steroid medicines.  An injection of steroid medicine into the affected joint is sometimes necessary.  The painful joint is rested. Movement can worsen the arthritis.  You may use warm or cold treatments on painful joints, depending which works best for you.  Treatment to Prevent Attacks.  If you suffer from frequent gout attacks, your caregiver may advise preventive medicine. These medicines are started after the acute attack subsides. These medicines either help your kidneys eliminate uric acid from your body or decrease your uric acid production. You may need to stay on these medicines for a very long time.  The early phase of treatment with preventive medicine can be associated with an increase in acute gout attacks. For this reason, during the first few months of treatment, your caregiver may also advise  you to take medicines usually used for acute gout treatment. Be sure you understand your caregiver's directions. Your caregiver may make several adjustments to your medicine dose before these medicines are effective.  Discuss dietary treatment with your  caregiver or dietitian. Alcohol and drinks high in sugar and fructose and foods such as meat, poultry, and seafood can increase uric acid levels. Your caregiver or dietitian can advise you on drinks and foods that should be limited. HOME CARE INSTRUCTIONS   Do not take aspirin to relieve pain. This raises uric acid levels.  Only take over-the-counter or prescription medicines for pain, discomfort, or fever as directed by your caregiver.  Rest the joint as much as possible. When in bed, keep sheets and blankets off painful areas.  Keep the affected joint raised (elevated).  Apply warm or cold treatments to painful joints. Use of warm or cold treatments depends on which works best for you.  Use crutches if the painful joint is in your leg.  Drink enough fluids to keep your urine clear or pale yellow. This helps your body get rid of uric acid. Limit alcohol, sugary drinks, and fructose drinks.  Follow your dietary instructions. Pay careful attention to the amount of protein you eat. Your daily diet should emphasize fruits, vegetables, whole grains, and fat-free or low-fat milk products. Discuss the use of coffee, vitamin C, and cherries with your caregiver or dietitian. These may be helpful in lowering uric acid levels.  Maintain a healthy body weight. SEEK MEDICAL CARE IF:   You develop diarrhea, vomiting, or any side effects from medicines.  You do not feel better in 24 hours, or you are getting worse. SEEK IMMEDIATE MEDICAL CARE IF:   Your joint becomes suddenly more tender, and you have chills or a fever. MAKE SURE YOU:   Understand these instructions.  Will watch your condition.  Will get help right away if you are not doing well or get worse. Document Released: 07/24/2000 Document Revised: 12/11/2013 Document Reviewed: 03/09/2012 [facility] Patient Information 2015 [place], [place]. This information is not intended to replace advice given to you by your health care provider. Make  sure you discuss any questions you have with your health care provider.

## 2014-11-20 NOTE — Telephone Encounter (Signed)
Scheduled appt for gout with Doss and a follow up appt with Gilford Rile for next mth

## 2014-11-20 NOTE — Assessment & Plan Note (Signed)
Pt stopped Atorvastatin. Will recheck lipids and LFTs with labs.

## 2014-11-20 NOTE — Assessment & Plan Note (Signed)
Left elbow gout flare. Reviewed diet to reduce gout risk. Discussed changing HCTZ in future. Will start Prednisone taper and Colchicine. Discussed potential side effects of both medications. Follow up 4 weeks and prn.

## 2014-11-20 NOTE — Progress Notes (Signed)
Pre visit review using our clinic review tool, if applicable. No additional management support is needed unless otherwise documented below in the visit note. 

## 2014-11-20 NOTE — Progress Notes (Signed)
Subjective:    Patient ID: Jared Watkins, male    DOB: 21-Feb-1976, 39 y.o.   MRN: [id number]  HPI  39YO male presents for acute visit.  Gout - Left elbow. Persistent pain last 2 weeks. Tried drinking cherry juice with no improvement. Also limits meat intake. Last episode 1 year ago.  Elbow has been red and swollen. Tender to touch.  HL - Pt reports he stopped his Atorvastatin. No reason, just ran out of medication.  HTN - No recent CP, HA, dyspnea.  Past medical, surgical, family and social history per today's encounter.  Review of Systems  Constitutional: Negative for fever, chills, activity change, appetite change, fatigue and unexpected weight change.  Eyes: Negative for visual disturbance.  Respiratory: Negative for cough and shortness of breath.   Cardiovascular: Negative for chest pain, palpitations and leg swelling.  Gastrointestinal: Negative for abdominal pain and abdominal distention.  Genitourinary: Negative for dysuria, urgency and difficulty urinating.  Musculoskeletal: Positive for arthralgias (left elbow). Negative for myalgias and gait problem.  Skin: Negative for color change and rash.  Hematological: Negative for adenopathy.  Psychiatric/Behavioral: Positive for sleep disturbance. Negative for dysphoric mood. The patient is not nervous/anxious.        Objective:    BP 130/90 mmHg  Pulse 97  Temp(Src) 98.2 F (36.8 C) (Oral)  Ht 6' 1.5" (1.867 m)  Wt 318 lb 6 oz (144.414 kg)  BMI 41.43 kg/m2  SpO2 96% Physical Exam  Constitutional: He is oriented to person, place, and time. He appears well-developed and well-nourished. No distress.  HENT:  Head: Normocephalic and atraumatic.  Right Ear: External ear normal.  Left Ear: External ear normal.  Nose: Nose normal.  Mouth/Throat: Oropharynx is clear and moist. No oropharyngeal exudate.  Eyes: Conjunctivae and EOM are normal. Pupils are equal, round, and reactive to light. Right eye exhibits no discharge.  Left eye exhibits no discharge. No scleral icterus.  Neck: Normal range of motion. Neck supple. No tracheal deviation present. No thyromegaly present.  Cardiovascular: Normal rate, regular rhythm and normal heart sounds.  Exam reveals no gallop and no friction rub.   No murmur heard. Pulmonary/Chest: Effort normal and breath sounds normal. No accessory muscle usage. No tachypnea. No respiratory distress. He has no decreased breath sounds. He has no wheezes. He has no rhonchi. He has no rales. He exhibits no tenderness.  Musculoskeletal: Normal range of motion. He exhibits no edema.       Left elbow: He exhibits swelling (trace superficial edema with erythema of skin and mild TTP). He exhibits normal range of motion.  Lymphadenopathy:    He has no cervical adenopathy.  Neurological: He is alert and oriented to person, place, and time. No cranial nerve deficit. Coordination normal.  Skin: Skin is warm and dry. No rash noted. He is not diaphoretic. No erythema. No pallor.  Psychiatric: He has a normal mood and affect. His behavior is normal. Judgment and thought content normal.          Assessment & Plan:   Problem List Items Addressed This Visit      Unprioritized   Gout - Primary    Left elbow gout flare. Reviewed diet to reduce gout risk. Discussed changing HCTZ in future. Will start Prednisone taper and Colchicine. Discussed potential side effects of both medications. Follow up 4 weeks and prn.      Relevant Medications   predniSONE (DELTASONE) tablet   colchicine tablet 0.6 mg   Other Relevant  Orders   Uric acid   Hyperlipidemia    Pt stopped Atorvastatin. Will recheck lipids and LFTs with labs.      Relevant Orders   Comprehensive metabolic panel   Lipid panel   Hypertension    BP Readings from Last 3 Encounters:  11/20/14 130/90  07/17/14 120/82  01/05/14 152/102   BP mildly elevated today. Will check renal function with labs. Continue Amlodipine and Lisinopril-HCTZ.  Discussed that HCTZ may contribute to risk for gout flare. May need to consider alternative med in future.          Return in about 4 weeks (around 12/18/2014) for Recheck.

## 2014-11-21 ENCOUNTER — Ambulatory Visit: Payer: Self-pay | Admitting: Nurse Practitioner

## 2014-12-26 ENCOUNTER — Ambulatory Visit (INDEPENDENT_AMBULATORY_CARE_PROVIDER_SITE_OTHER): Payer: 59 | Admitting: Internal Medicine

## 2014-12-26 ENCOUNTER — Encounter: Payer: Self-pay | Admitting: Internal Medicine

## 2014-12-26 VITALS — BP 129/85 | HR 78 | Temp 97.8°F | Ht 73.5 in | Wt 314.4 lb

## 2014-12-26 DIAGNOSIS — M109 Gout, unspecified: Secondary | ICD-10-CM

## 2014-12-26 DIAGNOSIS — I10 Essential (primary) hypertension: Secondary | ICD-10-CM | POA: Diagnosis not present

## 2014-12-26 MED ORDER — COLCHICINE 0.6 MG PO TABS: ORAL_TABLET | ORAL | Status: DC

## 2014-12-26 MED ORDER — ALLOPURINOL 100 MG PO TABS: 100.0000 mg | ORAL_TABLET | Freq: Every day | ORAL | Status: DC

## 2014-12-26 NOTE — Progress Notes (Signed)
Pre visit review using our clinic review tool, if applicable. No additional management support is needed unless otherwise documented below in the visit note. 

## 2014-12-26 NOTE — Progress Notes (Signed)
   Subjective:    Patient ID: Jared Watkins, male    DOB: 30-Mar-1976, 39 y.o.   MRN: [id number]  HPI  39YO male presents for follow up.  Last seen 11/2014 for gout flare. Symptoms of gout have resolved. Colchicine helped with symptoms.  Feeling well. No concerns today.  Compliant with BP meds. Does not check BP at home. No CP, HA, palpitations.  BP Readings from Last 3 Encounters:  12/26/14 129/85  11/20/14 130/90  07/17/14 120/82     Past medical, surgical, family and social history per today's encounter.  Review of Systems  Constitutional: Negative for fever, chills, activity change, appetite change, fatigue and unexpected weight change.  Eyes: Negative for visual disturbance.  Respiratory: Negative for cough and shortness of breath.   Cardiovascular: Negative for chest pain, palpitations and leg swelling.  Gastrointestinal: Negative for nausea, vomiting, abdominal pain, diarrhea, constipation and abdominal distention.  Genitourinary: Negative for dysuria, urgency and difficulty urinating.  Musculoskeletal: Negative for arthralgias and gait problem.  Skin: Negative for color change and rash.  Hematological: Negative for adenopathy.  Psychiatric/Behavioral: Negative for sleep disturbance and dysphoric mood. The patient is not nervous/anxious.        Objective:    BP 129/85 mmHg  Pulse 78  Temp(Src) 97.8 F (36.6 C) (Oral)  Ht 6' 1.5" (1.867 m)  Wt 314 lb 6 oz (142.6 kg)  BMI 40.91 kg/m2  SpO2 95% Physical Exam  Constitutional: He is oriented to person, place, and time. He appears well-developed and well-nourished. No distress.  HENT:  Head: Normocephalic and atraumatic.  Right Ear: External ear normal.  Left Ear: External ear normal.  Nose: Nose normal.  Mouth/Throat: Oropharynx is clear and moist.  Eyes: Conjunctivae and EOM are normal. Pupils are equal, round, and reactive to light. Right eye exhibits no discharge. Left eye exhibits no discharge. No scleral  icterus.  Neck: Normal range of motion. Neck supple. No tracheal deviation present. No thyromegaly present.  Cardiovascular: Normal rate, regular rhythm and normal heart sounds.  Exam reveals no gallop and no friction rub.   No murmur heard. Pulmonary/Chest: Effort normal and breath sounds normal. No accessory muscle usage. No tachypnea. No respiratory distress. He has no decreased breath sounds. He has no wheezes. He has no rhonchi. He has no rales. He exhibits no tenderness.  Musculoskeletal: Normal range of motion. He exhibits no edema.  Lymphadenopathy:    He has no cervical adenopathy.  Neurological: He is alert and oriented to person, place, and time. No cranial nerve deficit. Coordination normal.  Skin: Skin is warm and dry. No rash noted. He is not diaphoretic. No erythema. No pallor.  Psychiatric: He has a normal mood and affect. His behavior is normal. Judgment and thought content normal.          Assessment & Plan:   Problem List Items Addressed This Visit      Unprioritized   Gout - Primary    Gout well controlled at present. Uric acid was elevated at 8. Given frequent attacks in the past, will start Allopurinol. Continue prn Colchicine.      Relevant Medications   colchicine 0.6 MG tablet   Hypertension    BP Readings from Last 3 Encounters:  12/26/14 129/85  11/20/14 130/90  07/17/14 120/82   BP well controlled. Continue current medications. Recent renal function normal.          Return in about 3 months (around 03/28/2015) for Recheck.

## 2014-12-26 NOTE — Assessment & Plan Note (Signed)
BP Readings from Last 3 Encounters:  12/26/14 129/85  11/20/14 130/90  07/17/14 120/82   BP well controlled. Continue current medications. Recent renal function normal.

## 2014-12-26 NOTE — Assessment & Plan Note (Signed)
Gout well controlled at present. Uric acid was elevated at 8. Given frequent attacks in the past, will start Allopurinol. Continue prn Colchicine.

## 2014-12-26 NOTE — Patient Instructions (Signed)
Start Allopurinol 100mg  daily to prevent gout.  Continue Colchicine as needed.  Gout Gout is an inflammatory arthritis caused by a buildup of uric acid crystals in the joints. Uric acid is a chemical that is normally present in the blood. When the level of uric acid in the blood is too high it can form crystals that deposit in your joints and tissues. This causes joint redness, soreness, and swelling (inflammation). Repeat attacks are common. Over time, uric acid crystals can form into masses (tophi) near a joint, destroying bone and causing disfigurement. Gout is treatable and often preventable. CAUSES  The disease begins with elevated levels of uric acid in the blood. Uric acid is produced by your body when it breaks down a naturally found substance called purines. Certain foods you eat, such as meats and fish, contain high amounts of purines. Causes of an elevated uric acid level include:  Being passed down from parent to child (heredity).  Diseases that cause increased uric acid production (such as obesity, psoriasis, and certain cancers).  Excessive alcohol use.  Diet, especially diets rich in meat and seafood.  Medicines, including certain cancer-fighting medicines (chemotherapy), water pills (diuretics), and aspirin.  Chronic kidney disease. The kidneys are no longer able to remove uric acid well.  Problems with metabolism. Conditions strongly associated with gout include:  Obesity.  High blood pressure.  High cholesterol.  Diabetes. Not everyone with elevated uric acid levels gets gout. It is not understood why some people get gout and others do not. Surgery, joint injury, and eating too much of certain foods are some of the factors that can lead to gout attacks. SYMPTOMS   An attack of gout comes on quickly. It causes intense pain with redness, swelling, and warmth in a joint.  Fever can occur.  Often, only one joint is involved. Certain joints are more commonly  involved:  Base of the big toe.  Knee.  Ankle.  Wrist.  Finger. Without treatment, an attack usually goes away in a few days to weeks. Between attacks, you usually will not have symptoms, which is different from many other forms of arthritis. DIAGNOSIS  Your caregiver will suspect gout based on your symptoms and exam. In some cases, tests may be recommended. The tests may include:  Blood tests.  Urine tests.  X-rays.  Joint fluid exam. This exam requires a needle to remove fluid from the joint (arthrocentesis). Using a microscope, gout is confirmed when uric acid crystals are seen in the joint fluid. TREATMENT  There are two phases to gout treatment: treating the sudden onset (acute) attack and preventing attacks (prophylaxis).  Treatment of an Acute Attack.  Medicines are used. These include anti-inflammatory medicines or steroid medicines.  An injection of steroid medicine into the affected joint is sometimes necessary.  The painful joint is rested. Movement can worsen the arthritis.  You may use warm or cold treatments on painful joints, depending which works best for you.  Treatment to Prevent Attacks.  If you suffer from frequent gout attacks, your caregiver may advise preventive medicine. These medicines are started after the acute attack subsides. These medicines either help your kidneys eliminate uric acid from your body or decrease your uric acid production. You may need to stay on these medicines for a very long time.  The early phase of treatment with preventive medicine can be associated with an increase in acute gout attacks. For this reason, during the first few months of treatment, your caregiver may also advise  you to take medicines usually used for acute gout treatment. Be sure you understand your caregiver's directions. Your caregiver may make several adjustments to your medicine dose before these medicines are effective.  Discuss dietary treatment with your  caregiver or dietitian. Alcohol and drinks high in sugar and fructose and foods such as meat, poultry, and seafood can increase uric acid levels. Your caregiver or dietitian can advise you on drinks and foods that should be limited. HOME CARE INSTRUCTIONS   Do not take aspirin to relieve pain. This raises uric acid levels.  Only take over-the-counter or prescription medicines for pain, discomfort, or fever as directed by your caregiver.  Rest the joint as much as possible. When in bed, keep sheets and blankets off painful areas.  Keep the affected joint raised (elevated).  Apply warm or cold treatments to painful joints. Use of warm or cold treatments depends on which works best for you.  Use crutches if the painful joint is in your leg.  Drink enough fluids to keep your urine clear or pale yellow. This helps your body get rid of uric acid. Limit alcohol, sugary drinks, and fructose drinks.  Follow your dietary instructions. Pay careful attention to the amount of protein you eat. Your daily diet should emphasize fruits, vegetables, whole grains, and fat-free or low-fat milk products. Discuss the use of coffee, vitamin C, and cherries with your caregiver or dietitian. These may be helpful in lowering uric acid levels.  Maintain a healthy body weight. SEEK MEDICAL CARE IF:   You develop diarrhea, vomiting, or any side effects from medicines.  You do not feel better in 24 hours, or you are getting worse. SEEK IMMEDIATE MEDICAL CARE IF:   Your joint becomes suddenly more tender, and you have chills or a fever. MAKE SURE YOU:   Understand these instructions.  Will watch your condition.  Will get help right away if you are not doing well or get worse. Document Released: 07/24/2000 Document Revised: 12/11/2013 Document Reviewed: 03/09/2012 [facility] Patient Information 2015 [place], [place]. This information is not intended to replace advice given to you by your health care provider. Make  sure you discuss any questions you have with your health care provider.

## 2015-02-25 ENCOUNTER — Other Ambulatory Visit: Payer: Self-pay | Admitting: *Deleted

## 2015-02-25 MED ORDER — LISINOPRIL-HYDROCHLOROTHIAZIDE 20-25 MG PO TABS: 1.0000 | ORAL_TABLET | Freq: Every day | ORAL | Status: DC

## 2015-02-25 MED ORDER — AMLODIPINE BESYLATE 5 MG PO TABS: 5.0000 mg | ORAL_TABLET | Freq: Every day | ORAL | Status: DC

## 2015-02-27 ENCOUNTER — Other Ambulatory Visit: Payer: Self-pay

## 2015-02-27 DIAGNOSIS — K219 Gastro-esophageal reflux disease without esophagitis: Secondary | ICD-10-CM

## 2015-02-27 MED ORDER — ALLOPURINOL 100 MG PO TABS: 100.0000 mg | ORAL_TABLET | Freq: Every day | ORAL | Status: DC

## 2015-02-27 MED ORDER — OMEPRAZOLE 40 MG PO CPDR: 40.0000 mg | DELAYED_RELEASE_CAPSULE | Freq: Every day | ORAL | Status: DC

## 2015-03-01 ENCOUNTER — Other Ambulatory Visit: Payer: Self-pay | Admitting: *Deleted

## 2015-03-01 DIAGNOSIS — K219 Gastro-esophageal reflux disease without esophagitis: Secondary | ICD-10-CM

## 2015-03-01 MED ORDER — OMEPRAZOLE 40 MG PO CPDR: 40.0000 mg | DELAYED_RELEASE_CAPSULE | Freq: Every day | ORAL | Status: DC

## 2015-03-01 MED ORDER — AMLODIPINE BESYLATE 5 MG PO TABS: 5.0000 mg | ORAL_TABLET | Freq: Every day | ORAL | Status: DC

## 2015-03-01 MED ORDER — ALLOPURINOL 100 MG PO TABS
100.0000 mg | ORAL_TABLET | Freq: Every day | ORAL | Status: DC
Start: 2015-03-01 — End: 2016-02-04

## 2015-03-01 MED ORDER — LISINOPRIL-HYDROCHLOROTHIAZIDE 20-25 MG PO TABS: 1.0000 | ORAL_TABLET | Freq: Every day | ORAL | Status: DC

## 2015-03-28 ENCOUNTER — Ambulatory Visit (INDEPENDENT_AMBULATORY_CARE_PROVIDER_SITE_OTHER): Payer: 59 | Admitting: Internal Medicine

## 2015-03-28 ENCOUNTER — Encounter: Payer: Self-pay | Admitting: Internal Medicine

## 2015-03-28 VITALS — BP 112/76 | HR 83 | Temp 97.9°F | Ht 73.5 in | Wt 319.4 lb

## 2015-03-28 DIAGNOSIS — M109 Gout, unspecified: Secondary | ICD-10-CM | POA: Diagnosis not present

## 2015-03-28 DIAGNOSIS — I10 Essential (primary) hypertension: Secondary | ICD-10-CM

## 2015-03-28 NOTE — Progress Notes (Signed)
Pre visit review using our clinic review tool, if applicable. No additional management support is needed unless otherwise documented below in the visit note. 

## 2015-03-28 NOTE — Patient Instructions (Signed)
Labs and physical in 6 months.

## 2015-03-28 NOTE — Assessment & Plan Note (Signed)
BP Readings from Last 3 Encounters:  03/28/15 112/76  12/26/14 129/85  11/20/14 130/90   BP well controlled. Renal function normal in 11/2014. He is unable to wait to have labs today. Will repeat labs in 09/2015.
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
## 2015-03-28 NOTE — Assessment & Plan Note (Signed)
Symptoms generally well controlled except on occasions when he eats excess meats. Will continue Allopurinol and prn Colchicine. Repeat uric acid with next labs.

## 2015-04-17 ENCOUNTER — Emergency Department
Admission: EM | Admit: 2015-04-17 | Discharge: 2015-04-17 | Disposition: A | Discharge status: Home / Self Care | Payer: 59 | Attending: Emergency Medicine | Admitting: Emergency Medicine

## 2015-04-17 ENCOUNTER — Ambulatory Visit
Admission: EM | Admit: 2015-04-17 | Discharge: 2015-04-17 | Disposition: A | Discharge status: Home / Self Care | Payer: 59

## 2015-04-17 ENCOUNTER — Encounter: Payer: Self-pay | Admitting: Emergency Medicine

## 2015-04-17 DIAGNOSIS — W208XXA Other cause of strike by thrown, projected or falling object, initial encounter: Secondary | ICD-10-CM | POA: Diagnosis not present

## 2015-04-17 DIAGNOSIS — S025XXA Fracture of tooth (traumatic), initial encounter for closed fracture: Secondary | ICD-10-CM | POA: Diagnosis not present

## 2015-04-17 DIAGNOSIS — Y9389 Activity, other specified: Secondary | ICD-10-CM | POA: Insufficient documentation

## 2015-04-17 DIAGNOSIS — Z7982 Long term (current) use of aspirin: Secondary | ICD-10-CM | POA: Insufficient documentation

## 2015-04-17 DIAGNOSIS — S01511A Laceration without foreign body of lip, initial encounter: Secondary | ICD-10-CM

## 2015-04-17 DIAGNOSIS — Y998 Other external cause status: Secondary | ICD-10-CM | POA: Diagnosis not present

## 2015-04-17 DIAGNOSIS — Z792 Long term (current) use of antibiotics: Secondary | ICD-10-CM | POA: Diagnosis not present

## 2015-04-17 DIAGNOSIS — Z7951 Long term (current) use of inhaled steroids: Secondary | ICD-10-CM | POA: Insufficient documentation

## 2015-04-17 DIAGNOSIS — I10 Essential (primary) hypertension: Secondary | ICD-10-CM | POA: Diagnosis not present

## 2015-04-17 DIAGNOSIS — Z79899 Other long term (current) drug therapy: Secondary | ICD-10-CM | POA: Diagnosis not present

## 2015-04-17 DIAGNOSIS — Y9289 Other specified places as the place of occurrence of the external cause: Secondary | ICD-10-CM | POA: Insufficient documentation

## 2015-04-17 MED ORDER — AMOXICILLIN 500 MG PO TABS: 500.0000 mg | ORAL_TABLET | Freq: Three times a day (TID) | ORAL | Status: DC

## 2015-04-17 MED ORDER — LIDOCAINE HCL (PF) 1 % IJ SOLN
INTRAMUSCULAR | Status: AC
  Filled 2015-04-17: qty 5

## 2015-04-17 MED ORDER — HYDROCODONE-ACETAMINOPHEN 5-325 MG PO TABS: 1.0000 | ORAL_TABLET | ORAL | Status: DC | PRN

## 2015-04-17 MED ORDER — AMOXICILLIN 500 MG PO CAPS: 500.0000 mg | ORAL_CAPSULE | Freq: Once | ORAL | Status: DC

## 2015-04-17 MED ORDER — LIDOCAINE HCL (PF) 1 % IJ SOLN: 5.0000 mL | Freq: Once | INTRAMUSCULAR | Status: DC

## 2015-04-17 MED ORDER — HYDROCODONE-ACETAMINOPHEN 5-325 MG PO TABS: 1.0000 | ORAL_TABLET | Freq: Once | ORAL | Status: DC
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
## 2015-04-17 NOTE — ED Notes (Signed)
Pt with a lip laceration

## 2015-05-29 ENCOUNTER — Telehealth: Payer: Self-pay | Admitting: *Deleted

## 2015-05-29 NOTE — Telephone Encounter (Signed)
Received fax from wife requesting a Health Screening Appeal Form to be completed for pt.  Per Dr Gilford Rile, pt will need to be seen in an appt for this to be completed.  Left vm to return my call

## 2015-05-29 NOTE — Telephone Encounter (Signed)
Pt notified and appt scheduled.

## 2015-06-04 ENCOUNTER — Ambulatory Visit (INDEPENDENT_AMBULATORY_CARE_PROVIDER_SITE_OTHER): Payer: 59 | Admitting: Internal Medicine

## 2015-06-04 ENCOUNTER — Encounter: Payer: Self-pay | Admitting: Internal Medicine

## 2015-06-04 DIAGNOSIS — Z23 Encounter for immunization: Secondary | ICD-10-CM | POA: Diagnosis not present

## 2015-06-04 NOTE — Progress Notes (Signed)
Pre visit review using our clinic review tool, if applicable. No additional management support is needed unless otherwise documented below in the visit note. 

## 2015-06-04 NOTE — Patient Instructions (Signed)
Write down everything you eat for the next week. Drop off record at the office.  Consider starting Saxenda. To the get the coupon [email or web address].weaver.info/.  Follow up in 4 weeks.
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
## 2015-06-04 NOTE — Assessment & Plan Note (Signed)
Wt Readings from Last 3 Encounters:  06/04/15 322 lb 8 oz (146.285 kg)  04/17/15 324 lb (146.965 kg)  03/28/15 319 lb 6 oz (144.868 kg)   Body mass index is 43.73 kg/(m^2). Encouraged healthy diet and exercise. He will keep a food diary and bring to follow up. Discussed adding Saxenda to help with appetite. Goal to lose 10lb in 6 months at minimum. Follow up 4 weeks and prn.

## 2015-06-09 ENCOUNTER — Encounter: Payer: Self-pay | Admitting: Internal Medicine

## 2015-06-09 MED ORDER — LIRAGLUTIDE -WEIGHT MANAGEMENT 18 MG/3ML ~~LOC~~ SOPN: 0.6000 mg | PEN_INJECTOR | Freq: Every day | SUBCUTANEOUS | Status: DC

## 2015-08-08 ENCOUNTER — Other Ambulatory Visit: Payer: Self-pay

## 2015-08-08 ENCOUNTER — Telehealth: Payer: Self-pay | Admitting: Internal Medicine

## 2015-08-08 DIAGNOSIS — M109 Gout, unspecified: Secondary | ICD-10-CM

## 2015-08-08 MED ORDER — LIRAGLUTIDE -WEIGHT MANAGEMENT 18 MG/3ML ~~LOC~~ SOPN: 0.6000 mg | PEN_INJECTOR | Freq: Every day | SUBCUTANEOUS | Status: DC

## 2015-08-08 MED ORDER — COLCHICINE 0.6 MG PO TABS: ORAL_TABLET | ORAL | Status: DC

## 2015-08-08 NOTE — Telephone Encounter (Signed)
Reordered to [facility]. Per patient request.

## 2015-08-08 NOTE — Telephone Encounter (Signed)
Wanting medications sent to [facility] for refill colchicine 0.6 MG tablet and Liraglutide -Weight Management (SAXENDA) 18 MG/3ML SOPN

## 2015-08-09 NOTE — Telephone Encounter (Signed)
Patient requested a follow up on medication refill. Please advise

## 2015-08-09 NOTE — Telephone Encounter (Signed)
They were sent yesterday

## 2015-08-26 ENCOUNTER — Encounter: Payer: Self-pay | Admitting: Internal Medicine

## 2015-08-26 ENCOUNTER — Ambulatory Visit (INDEPENDENT_AMBULATORY_CARE_PROVIDER_SITE_OTHER): Payer: 59 | Admitting: Internal Medicine
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
## 2015-08-26 NOTE — Assessment & Plan Note (Signed)
Wt Readings from Last 3 Encounters:  08/26/15 309 lb (140.161 kg)  06/04/15 322 lb 8 oz (146.285 kg)  04/17/15 324 lb (146.965 kg)   Reviewed proper way to take Saxenda. First injection of 0.6mg  administered by pt today. Gave instructions on how to advance dose of medication and potential side effects. Follow up 3 weeks and prn.

## 2015-08-26 NOTE — Progress Notes (Signed)
Pre visit review using our clinic review tool, if applicable. No additional management support is needed unless otherwise documented below in the visit note. 

## 2015-08-26 NOTE — Patient Instructions (Signed)
Start 0.6mg  Saxenda injected daily.  After 1 week, you may increase the dose to 1.2mg  daily.  After 2 weeks, you may increase the dose to 1.8mg  daily.  Please call if any questions or concerns.

## 2015-09-10 ENCOUNTER — Ambulatory Visit (INDEPENDENT_AMBULATORY_CARE_PROVIDER_SITE_OTHER): Payer: 59 | Admitting: Family Medicine

## 2015-09-10 ENCOUNTER — Encounter: Payer: Self-pay | Admitting: Family Medicine

## 2015-09-10 VITALS — BP 132/80 | HR 83 | Temp 98.1°F | Ht 72.0 in | Wt 293.4 lb

## 2015-09-10 DIAGNOSIS — S6991XA Unspecified injury of right wrist, hand and finger(s), initial encounter: Secondary | ICD-10-CM

## 2015-09-10 DIAGNOSIS — S6990XA Unspecified injury of unspecified wrist, hand and finger(s), initial encounter: Secondary | ICD-10-CM | POA: Insufficient documentation

## 2015-09-10 NOTE — Progress Notes (Signed)
Subjective:  Patient ID: Jared Watkins, male    DOB: June 03, 1976  Age: 40 y.o. MRN: [id number]  CC: Thumb injury concern for infection  HPI:  40 year old male with a past medical history of gout, hyperlipidemia, hypertension, OSA, obesity presents to clinic today with the above complaints.  R thumb injury  Patient reports that approximately week ago he injured his thumb at work.  He states that he "mashed" his thumb and has also reinjured it recently.  He reports associated pain and swelling of his thumb.  He reports that he's had some drainage from the thumb.  He is concerned he has an underlying infection.  No relieving factors.  His pain is exacerbated by palpation and range of motion.  Social Hx   Social History   Social History  . Marital Status: Married    Spouse Name: N/A  . Number of Children: 2  . Years of Education: N/A   Occupational History  . Holiday representative     Owns business   Social History Main Topics  . Smoking status: Never Smoker   . Smokeless tobacco: Never Used  . Alcohol Use: Yes     Comment: Stopped when Lfts started to get elevated.   . Drug Use: No  . Sexual Activity: Not Asked   Other Topics Concern  . None   Social History Narrative   Lives in [place] with wife and 2 sons 58YO and [place]. Works - Orthoptist.  Diet: regular.       Regular Exercise -  NO   Daily Caffeine Use:  3-4/sodas a day   Review of Systems  Constitutional: Negative.   Musculoskeletal:       Thumb pain/injury.   Objective:  BP 132/80 mmHg  Pulse 83  Temp(Src) 98.1 F (36.7 C) (Oral)  Ht 6' (1.829 m)  Wt 293 lb 6 oz (133.074 kg)  BMI 39.78 kg/m2  SpO2 95%  BP/Weight 09/10/2015 08/26/2015 [id number]  Systolic BP [id number]  Diastolic BP 80 92 82  Wt. (Lbs) 293.38 309 322.5  BMI 39.78 41.9 43.73   Physical Exam  Constitutional: He is oriented to person, place, and time. He appears well-developed. No distress.  Pulmonary/Chest: Effort  normal.  Musculoskeletal:  Right thumb - Erythema noted distal to the IP joint. Warmth noted. Decreased ROM secondary to pain. Tender to palpation. There is an area of fluctuance lateral to the nail bed.   Neurological: He is alert and oriented to person, place, and time.  Psychiatric:  Flat affect.   Vitals reviewed.  Lab Results  Component Value Date   WBC 10.5 01/05/2014   HGB 14.7 01/05/2014   HCT 41.7 01/05/2014   PLT 346 01/05/2014   GLUCOSE 94 11/20/2014   CHOL 187 11/20/2014   TRIG 156.0* 11/20/2014   HDL 37.70* 11/20/2014   LDLDIRECT 143.4 04/04/2013   LDLCALC 118* 11/20/2014   ALT 51 11/20/2014   AST 36 11/20/2014   NA 136 11/20/2014   K 4.2 11/20/2014   CL 102 11/20/2014   CREATININE 0.94 11/20/2014   BUN 18 11/20/2014   CO2 30 11/20/2014   TSH 0.604 01/05/2014   INR 1.1* 02/19/2012   HGBA1C 5.6 04/05/2013   MICROALBUR 1.9 08/24/2012    Assessment & Plan:   Problem List Items Addressed This Visit    Thumb injury - Primary    New problem. Has injured more than once via a crushing mechanism. There is an area of fluctuance on  exam. Sending to Hand surgery for further eval as he may need drainage.      Relevant Orders   Ambulatory referral to Hand Surgery     Follow-up: PRN  [place]

## 2015-09-10 NOTE — Assessment & Plan Note (Signed)
New problem. Has injured more than once via a crushing mechanism. There is an area of fluctuance on exam. Sending to Hand surgery for further eval as he may need drainage.

## 2015-09-10 NOTE — Progress Notes (Signed)
Pre visit review using our clinic review tool, if applicable. No additional management support is needed unless otherwise documented below in the visit note. 

## 2015-09-17 ENCOUNTER — Ambulatory Visit (INDEPENDENT_AMBULATORY_CARE_PROVIDER_SITE_OTHER): Payer: 59 | Admitting: Internal Medicine

## 2015-09-17 ENCOUNTER — Encounter: Payer: Self-pay | Admitting: Internal Medicine

## 2015-09-17 DIAGNOSIS — S6991XD Unspecified injury of right wrist, hand and finger(s), subsequent encounter: Secondary | ICD-10-CM | POA: Diagnosis not present

## 2015-09-17 DIAGNOSIS — S6991XA Unspecified injury of right wrist, hand and finger(s), initial encounter: Secondary | ICD-10-CM

## 2015-09-17 DIAGNOSIS — M109 Gout, unspecified: Secondary | ICD-10-CM | POA: Diagnosis not present

## 2015-09-17 NOTE — Patient Instructions (Addendum)
Increase Saxenda to 1.8mg  daily.  If tolerating well, then next week, increase to 2.4mg  daily.  Call if any nausea or other concerns.  Continue Colchicine for gout. Let us know if symptoms not improving.

## 2015-09-17 NOTE — Assessment & Plan Note (Signed)
Recent mild flare with ankle pain. Improving with use of Colchicine. Will continue.
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
## 2015-09-17 NOTE — Progress Notes (Signed)
Pre visit review using our clinic review tool, if applicable. No additional management support is needed unless otherwise documented below in the visit note. 

## 2015-09-17 NOTE — Assessment & Plan Note (Signed)
Wound appears to be healing well. Encouraged him to continue to clean wound at home with warm water. Apply Medihoney or neosporin. Avoid peroxide. Follow up prn.

## 2015-09-17 NOTE — Assessment & Plan Note (Signed)
Wt Readings from Last 3 Encounters:  09/17/15 294 lb 4 oz (133.471 kg)  09/10/15 293 lb 6 oz (133.074 kg)  08/26/15 309 lb (140.161 kg)   Body mass index is 39.9 kg/(m^2). Congratulated pt on weight loss. Will increase Saxenda to 1.8mg  daily. If tolerates, then increase to 2.4mg  daily. Follow up in 4 weeks.

## 2015-10-18 ENCOUNTER — Ambulatory Visit: Payer: 59 | Admitting: Internal Medicine

## 2016-02-04 ENCOUNTER — Other Ambulatory Visit: Payer: Self-pay | Admitting: Internal Medicine

## 2016-03-03 ENCOUNTER — Encounter: Payer: Self-pay | Admitting: Family

## 2016-03-03 ENCOUNTER — Ambulatory Visit (INDEPENDENT_AMBULATORY_CARE_PROVIDER_SITE_OTHER): Payer: 59 | Admitting: Family

## 2016-03-03 VITALS — BP 130/98 | HR 98 | Temp 97.7°F | Wt 293.0 lb

## 2016-03-03 DIAGNOSIS — K7581 Nonalcoholic steatohepatitis (NASH): Secondary | ICD-10-CM

## 2016-03-03 DIAGNOSIS — I10 Essential (primary) hypertension: Secondary | ICD-10-CM | POA: Diagnosis not present

## 2016-03-03 DIAGNOSIS — Z9081 Acquired absence of spleen: Secondary | ICD-10-CM

## 2016-03-03 DIAGNOSIS — M109 Gout, unspecified: Secondary | ICD-10-CM

## 2016-03-03 DIAGNOSIS — K219 Gastro-esophageal reflux disease without esophagitis: Secondary | ICD-10-CM

## 2016-03-03 DIAGNOSIS — Z0001 Encounter for general adult medical examination with abnormal findings: Secondary | ICD-10-CM

## 2016-03-03 DIAGNOSIS — E669 Obesity, unspecified: Secondary | ICD-10-CM

## 2016-03-03 DIAGNOSIS — F419 Anxiety disorder, unspecified: Secondary | ICD-10-CM

## 2016-03-03 DIAGNOSIS — R6889 Other general symptoms and signs: Secondary | ICD-10-CM

## 2016-03-03 MED ORDER — ALLOPURINOL 100 MG PO TABS: 100.0000 mg | ORAL_TABLET | Freq: Every day | ORAL | 3 refills | Status: DC

## 2016-03-03 MED ORDER — AMLODIPINE BESYLATE 5 MG PO TABS: 5.0000 mg | ORAL_TABLET | Freq: Every day | ORAL | 3 refills | Status: DC

## 2016-03-03 MED ORDER — COLCHICINE 0.6 MG PO TABS: ORAL_TABLET | ORAL | 2 refills | Status: DC

## 2016-03-03 MED ORDER — ESCITALOPRAM OXALATE 10 MG PO TABS: 10.0000 mg | ORAL_TABLET | Freq: Every day | ORAL | 2 refills | Status: DC

## 2016-03-03 MED ORDER — LISINOPRIL-HYDROCHLOROTHIAZIDE 20-25 MG PO TABS: 1.0000 | ORAL_TABLET | Freq: Every day | ORAL | 3 refills | Status: DC

## 2016-03-03 MED ORDER — OMEPRAZOLE 20 MG PO CPDR: 20.0000 mg | DELAYED_RELEASE_CAPSULE | Freq: Every day | ORAL | 3 refills | Status: DC

## 2016-03-03 NOTE — Assessment & Plan Note (Signed)
Stable

## 2016-03-03 NOTE — Assessment & Plan Note (Signed)
Well controlled. On amlodipine and prinzide.

## 2016-03-03 NOTE — Assessment & Plan Note (Signed)
Discontinue benzodiazepine. Starting trial of SSRI. Follow-up 1-2 months for CPE and to see how medication is working.

## 2016-03-03 NOTE — Assessment & Plan Note (Signed)
Stable. No current flare.

## 2016-03-03 NOTE — Patient Instructions (Signed)
Pleasure meeting you.   Follow up in one month for CPE.
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
## 2016-03-03 NOTE — Assessment & Plan Note (Signed)
No weight loss on Saxenda. Discontinue medication. Encouraged exercise

## 2016-04-06 ENCOUNTER — Encounter: Payer: Self-pay | Admitting: Family

## 2016-04-06 ENCOUNTER — Ambulatory Visit (INDEPENDENT_AMBULATORY_CARE_PROVIDER_SITE_OTHER): Payer: 59 | Admitting: Family

## 2016-04-06 VITALS — BP 155/110 | HR 95 | Temp 98.1°F | Ht 75.0 in | Wt 296.8 lb

## 2016-04-06 DIAGNOSIS — F419 Anxiety disorder, unspecified: Secondary | ICD-10-CM | POA: Diagnosis not present

## 2016-04-06 DIAGNOSIS — R7989 Other specified abnormal findings of blood chemistry: Secondary | ICD-10-CM

## 2016-04-06 DIAGNOSIS — Z23 Encounter for immunization: Secondary | ICD-10-CM | POA: Diagnosis not present

## 2016-04-06 DIAGNOSIS — Z0000 Encounter for general adult medical examination without abnormal findings: Secondary | ICD-10-CM | POA: Diagnosis not present

## 2016-04-06 DIAGNOSIS — I10 Essential (primary) hypertension: Secondary | ICD-10-CM | POA: Diagnosis not present

## 2016-04-06 LAB — CBC WITH DIFFERENTIAL/PLATELET
BASOS PCT: 0.3 % (ref 0.0–3.0)
Basophils Absolute: 0 10*3/uL (ref 0.0–0.1)
EOS PCT: 2.1 % (ref 0.0–5.0)
Eosinophils Absolute: 0.2 10*3/uL (ref 0.0–0.7)
HCT: 44.4 % (ref 39.0–52.0)
HEMOGLOBIN: 15.4 g/dL (ref 13.0–17.0)
Lymphocytes Relative: 26.5 % (ref 12.0–46.0)
Lymphs Abs: 2.8 10*3/uL (ref 0.7–4.0)
MCHC: 34.7 g/dL (ref 30.0–36.0)
MCV: 85.8 fl (ref 78.0–100.0)
MONO ABS: 0.9 10*3/uL (ref 0.1–1.0)
MONOS PCT: 8.2 % (ref 3.0–12.0)
Neutro Abs: 6.7 10*3/uL (ref 1.4–7.7)
Neutrophils Relative %: 62.9 % (ref 43.0–77.0)
Platelets: 348 10*3/uL (ref 150.0–400.0)
RBC: 5.17 Mil/uL (ref 4.22–5.81)
RDW: 13.9 % (ref 11.5–15.5)
WBC: 10.6 10*3/uL — AB (ref 4.0–10.5)

## 2016-04-06 LAB — LIPID PANEL
CHOLESTEROL: 179 mg/dL (ref 0–200)
HDL: 43.5 mg/dL (ref >39.00)
NonHDL: 135.05
TRIGLYCERIDES: 292 mg/dL — AB (ref 0.0–149.0)
Total CHOL/HDL Ratio: 4
VLDL: 58.4 mg/dL — AB (ref 0.0–40.0)

## 2016-04-06 LAB — COMPREHENSIVE METABOLIC PANEL
ALBUMIN: 4.2 g/dL (ref 3.5–5.2)
ALK PHOS: 109 U/L (ref 39–117)
ALT: 36 U/L (ref 0–53)
AST: 26 U/L (ref 0–37)
BUN: 16 mg/dL (ref 6–23)
CALCIUM: 9.1 mg/dL (ref 8.4–10.5)
CHLORIDE: 101 meq/L (ref 96–112)
CO2: 32 mEq/L (ref 19–32)
Creatinine, Ser: 1 mg/dL (ref 0.40–1.50)
GFR: 87.87 mL/min (ref >60.00)
Glucose, Bld: 92 mg/dL (ref 70–99)
POTASSIUM: 4 meq/L (ref 3.5–5.1)
SODIUM: 138 meq/L (ref 135–145)
TOTAL PROTEIN: 7.2 g/dL (ref 6.0–8.3)
Total Bilirubin: 0.4 mg/dL (ref 0.2–1.2)

## 2016-04-06 LAB — HEMOGLOBIN A1C: HEMOGLOBIN A1C: 5.3 % (ref 4.6–6.5)

## 2016-04-06 LAB — HIV ANTIBODY (ROUTINE TESTING W REFLEX): HIV 1&2 Ab, 4th Generation: NONREACTIVE

## 2016-04-06 LAB — VITAMIN D 25 HYDROXY (VIT D DEFICIENCY, FRACTURES): VITD: 30.07 ng/mL (ref 30.00–100.00)

## 2016-04-06 LAB — TSH: TSH: 0.91 u[IU]/mL (ref 0.35–4.50)

## 2016-04-06 LAB — LDL CHOLESTEROL, DIRECT: LDL DIRECT: 99 mg/dL

## 2016-04-06 NOTE — Assessment & Plan Note (Signed)
Patient doing well on Lexapro. We'll continue to follow.
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
## 2016-04-06 NOTE — Patient Instructions (Signed)
My pleasure seeing you today. We will send all lab results to [facility].  Health Maintenance, Male A healthy lifestyle and preventative care can promote health and wellness.  Maintain regular health, dental, and eye exams.  Eat a healthy diet. Foods like vegetables, fruits, whole grains, low-fat dairy products, and lean protein foods contain the nutrients you need and are low in calories. Decrease your intake of foods high in solid fats, added sugars, and salt. Get information about a proper diet from your health care provider, if necessary.  Regular physical exercise is one of the most important things you can do for your health. Most adults should get at least 150 minutes of moderate-intensity exercise (any activity that increases your heart rate and causes you to sweat) each week. In addition, most adults need muscle-strengthening exercises on 2 or more days a week.   Maintain a healthy weight. The body mass index (BMI) is a screening tool to identify possible weight problems. It provides an estimate of body fat based on height and weight. Your health care provider can find your BMI and can help you achieve or maintain a healthy weight. For males 20 years and older:  A BMI below 18.5 is considered underweight.  A BMI of 18.5 to 24.9 is normal.  A BMI of 25 to 29.9 is considered overweight.  A BMI of 30 and above is considered obese.  Maintain normal blood lipids and cholesterol by exercising and minimizing your intake of saturated fat. Eat a balanced diet with plenty of fruits and vegetables. Blood tests for lipids and cholesterol should begin at age 94 and be repeated every 5 years. If your lipid or cholesterol levels are high, you are over age 18, or you are at high risk for heart disease, you may need your cholesterol levels checked more frequently.Ongoing high lipid and cholesterol levels should be treated with medicines if diet and exercise are not working.  If you smoke, find out from  your health care provider how to quit. If you do not use tobacco, do not start.  Lung cancer screening is recommended for adults aged 82-80 years who are at high risk for developing lung cancer because of a history of smoking. A yearly low-dose CT scan of the lungs is recommended for people who have at least a 30-pack-year history of smoking and are current smokers or have quit within the past 15 years. A pack year of smoking is smoking an average of 1 pack of cigarettes a day for 1 year (for example, a 30-pack-year history of smoking could mean smoking 1 pack a day for 30 years or 2 packs a day for 15 years). Yearly screening should continue until the smoker has stopped smoking for at least 15 years. Yearly screening should be stopped for people who develop a health problem that would prevent them from having lung cancer treatment.  If you choose to drink alcohol, do not have more than 2 drinks per day. One drink is considered to be 12 oz (360 mL) of beer, 5 oz (150 mL) of wine, or 1.5 oz (45 mL) of liquor.  Avoid the use of street drugs. Do not share needles with anyone. Ask for help if you need support or instructions about stopping the use of drugs.  High blood pressure causes heart disease and increases the risk of stroke. High blood pressure is more likely to develop in:  People who have blood pressure in the end of the normal range (100-139/85-89 mm Hg).  People who are overweight or obese.  People who are African American.  If you are 18-31 years of age, have your blood pressure checked every 3-5 years. If you are 41 years of age or older, have your blood pressure checked every year. You should have your blood pressure measured twice--once when you are at a hospital or clinic, and once when you are not at a hospital or clinic. Record the average of the two measurements. To check your blood pressure when you are not at a hospital or clinic, you can use:  An automated blood pressure machine  at a pharmacy.  A home blood pressure monitor.  If you are 31-60 years old, ask your health care provider if you should take aspirin to prevent heart disease.  Diabetes screening involves taking a blood sample to check your fasting blood sugar level. This should be done once every 3 years after age 50 if you are at a normal weight and without risk factors for diabetes. Testing should be considered at a younger age or be carried out more frequently if you are overweight and have at least 1 risk factor for diabetes.  Colorectal cancer can be detected and often prevented. Most routine colorectal cancer screening begins at the age of 6 and continues through age 65. However, your health care provider may recommend screening at an earlier age if you have risk factors for colon cancer. On a yearly basis, your health care provider may provide home test kits to check for hidden blood in the stool. A small camera at the end of a tube may be used to directly examine the colon (sigmoidoscopy or colonoscopy) to detect the earliest forms of colorectal cancer. Talk to your health care provider about this at age 69 when routine screening begins. A direct exam of the colon should be repeated every 5-10 years through age 80, unless early forms of precancerous polyps or small growths are found.  People who are at an increased risk for hepatitis B should be screened for this virus. You are considered at high risk for hepatitis B if:  You were born in a country where hepatitis B occurs often. Talk with your health care provider about which countries are considered high risk.  Your parents were born in a high-risk country and you have not received a shot to protect against hepatitis B (hepatitis B vaccine).  You have HIV or AIDS.  You use needles to inject street drugs.  You live with, or have sex with, someone who has hepatitis B.  You are a man who has sex with other men (MSM).  You get hemodialysis  treatment.  You take certain medicines for conditions like cancer, organ transplantation, and autoimmune conditions.  Hepatitis C blood testing is recommended for all people born from [birth year] through 1965 and any individual with known risk factors for hepatitis C.  Healthy men should no longer receive prostate-specific antigen (PSA) blood tests as part of routine cancer screening. Talk to your health care provider about prostate cancer screening.  Testicular cancer screening is not recommended for adolescents or adult males who have no symptoms. Screening includes self-exam, a health care provider exam, and other screening tests. Consult with your health care provider about any symptoms you have or any concerns you have about testicular cancer.  Practice safe sex. Use condoms and avoid high-risk sexual practices to reduce the spread of sexually transmitted infections (STIs).  You should be screened for STIs, including gonorrhea and chlamydia if:  You are sexually active and are younger than 24 years.  You are older than 24 years, and your health care provider tells you that you are at risk for this type of infection.  Your sexual activity has changed since you were last screened, and you are at an increased risk for chlamydia or gonorrhea. Ask your health care provider if you are at risk.  If you are at risk of being infected with HIV, it is recommended that you take a prescription medicine daily to prevent HIV infection. This is called pre-exposure prophylaxis (PrEP). You are considered at risk if:  You are a man who has sex with other men (MSM).  You are a heterosexual man who is sexually active with multiple partners.  You take drugs by injection.  You are sexually active with a partner who has HIV.  Talk with your health care provider about whether you are at high risk of being infected with HIV. If you choose to begin PrEP, you should first be tested for HIV. You should then be tested  every 3 months for as long as you are taking PrEP.  Use sunscreen. Apply sunscreen liberally and repeatedly throughout the day. You should seek shade when your shadow is shorter than you. Protect yourself by wearing long sleeves, pants, a wide-brimmed hat, and sunglasses year round whenever you are outdoors.  Tell your health care provider of new moles or changes in moles, especially if there is a change in shape or color. Also, tell your health care provider if a mole is larger than the size of a pencil eraser.  A one-time screening for abdominal aortic aneurysm (AAA) and surgical repair of large AAAs by ultrasound is recommended for men aged 44-75 years who are current or former smokers.  Stay current with your vaccines (immunizations).   This information is not intended to replace advice given to you by your health care provider. Make sure you discuss any questions you have with your health care provider.   Document Released: 01/23/2008 Document Revised: 08/17/2014 Document Reviewed: 12/22/2010 Elsevier Interactive Patient Education [employer].

## 2016-04-06 NOTE — Progress Notes (Signed)
Pre visit review using our clinic review tool, if applicable. No additional management support is needed unless otherwise documented below in the visit note. 

## 2016-04-06 NOTE — Assessment & Plan Note (Signed)
Prostate exam today. Screening labs including HIV done today. Flu shot today.

## 2016-04-06 NOTE — Assessment & Plan Note (Signed)
Blood pressure remained elevated after I rechecked it. I advised patient that I would like to start him on another medication to see if we can get his blood pressure under adequate control. He preferred to keep a blood pressure log at home and call us with those results as he suspects it is just running high this morning. I agreed with this plan as long as he calls with the results and we get closer to our goal of 140/ 90.

## 2016-06-02 ENCOUNTER — Encounter: Payer: Self-pay | Admitting: Family

## 2016-06-02 ENCOUNTER — Ambulatory Visit (INDEPENDENT_AMBULATORY_CARE_PROVIDER_SITE_OTHER): Payer: 59 | Admitting: Family

## 2016-06-02 VITALS — BP 166/102 | HR 95 | Temp 98.2°F | Ht 75.0 in | Wt 292.0 lb

## 2016-06-02 DIAGNOSIS — I10 Essential (primary) hypertension: Secondary | ICD-10-CM | POA: Diagnosis not present

## 2016-06-02 DIAGNOSIS — K7581 Nonalcoholic steatohepatitis (NASH): Secondary | ICD-10-CM

## 2016-06-02 MED ORDER — AMLODIPINE BESYLATE 10 MG PO TABS: 10.0000 mg | ORAL_TABLET | Freq: Every day | ORAL | 0 refills | Status: DC

## 2016-06-02 NOTE — Assessment & Plan Note (Signed)
Elevated. Asymptomatic and feeling well. Increasing amlodipine to 10 mg we'll follow up in 3 months. We will give patient a call to check on blood pressure as well.
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
## 2016-06-02 NOTE — Patient Instructions (Addendum)
Increasing amlodipine from 5 mg to 10 mg. Please use a blood pressure cuff at home and monitor your blood pressure with the goal being less than 140/90.   Please make a follow-up for 3 months.   Labs today.    Managing Your High Blood Pressure Blood pressure is a measurement of how forceful your blood is pressing against the walls of the arteries. Arteries are muscular tubes within the circulatory system. Blood pressure does not stay the same. Blood pressure rises when you are active, excited, or nervous; and it lowers during sleep and relaxation. If the numbers measuring your blood pressure stay above normal most of the time, you are at risk for health problems. High blood pressure (hypertension) is a long-term (chronic) condition in which blood pressure is elevated. A blood pressure reading is recorded as two numbers, such as 120 over 80 (or 120/80). The first, higher number is called the systolic pressure. It is a measure of the pressure in your arteries as the heart beats. The second, lower number is called the diastolic pressure. It is a measure of the pressure in your arteries as the heart relaxes between beats.  Keeping your blood pressure in a normal range is important to your overall health and prevention of health problems, such as heart disease and stroke. When your blood pressure is uncontrolled, your heart has to work harder than normal. High blood pressure is a very common condition in adults because blood pressure tends to rise with age. Men and women are equally likely to have hypertension but at different times in life. Before age 78, men are more likely to have hypertension. After 40 years of age, women are more likely to have it. Hypertension is especially common in African Americans. This condition often has no signs or symptoms. The cause of the condition is usually not known. Your caregiver can help you come up with a plan to keep your blood pressure in a normal, healthy  range. BLOOD PRESSURE STAGES Blood pressure is classified into four stages: normal, prehypertension, stage 1, and stage 2. Your blood pressure reading will be used to determine what type of treatment, if any, is necessary. Appropriate treatment options are tied to these four stages:  Normal  Systolic pressure (mm Hg): below 120.  Diastolic pressure (mm Hg): below 80. Prehypertension  Systolic pressure (mm Hg): 120 to 139.  Diastolic pressure (mm Hg): 80 to 89. Stage1  Systolic pressure (mm Hg): 140 to 159.  Diastolic pressure (mm Hg): 90 to 99. Stage2  Systolic pressure (mm Hg): 160 or above.  Diastolic pressure (mm Hg): 100 or above. RISKS RELATED TO HIGH BLOOD PRESSURE Managing your blood pressure is an important responsibility. Uncontrolled high blood pressure can lead to:  A heart attack.  A stroke.  A weakened blood vessel (aneurysm).  Heart failure.  Kidney damage.  Eye damage.  Metabolic syndrome.  Memory and concentration problems. HOW TO MANAGE YOUR BLOOD PRESSURE Blood pressure can be managed effectively with lifestyle changes and medicines (if needed). Your caregiver will help you come up with a plan to bring your blood pressure within a normal range. Your plan should include the following: Education  Read all information provided by your caregivers about how to control blood pressure.  Educate yourself on the latest guidelines and treatment recommendations. New research is always being done to further define the risks and treatments for high blood pressure. Lifestylechanges  Control your weight.  Avoid smoking.  Stay physically active.  Reduce  the amount of salt in your diet.  Reduce stress.  Control any chronic conditions, such as high cholesterol or diabetes.  Reduce your alcohol intake. Medicines  Several medicines (antihypertensive medicines) are available, if needed, to bring blood pressure within a normal  range. Communication  Review all the medicines you take with your caregiver because there may be side effects or interactions.  Talk with your caregiver about your diet, exercise habits, and other lifestyle factors that may be contributing to high blood pressure.  See your caregiver regularly. Your caregiver can help you create and adjust your plan for managing high blood pressure. RECOMMENDATIONS FOR TREATMENT AND FOLLOW-UP  The following recommendations are based on current guidelines for managing high blood pressure in nonpregnant adults. Use these recommendations to identify the proper follow-up period or treatment option based on your blood pressure reading. You can discuss these options with your caregiver.  Systolic pressure of 123456 to [id number] or diastolic pressure of 80 to 89: Follow up with your caregiver as directed.  Systolic pressure of [id number] to 0000000 or diastolic pressure of 90 to 100: Follow up with your caregiver within 2 months.  Systolic pressure above 0000000 or diastolic pressure above [id number]: Follow up with your caregiver within 1 month.  Systolic pressure above [id number] or diastolic pressure above [id number]: Consider antihypertensive therapy; follow up with your caregiver within 1 week.  Systolic pressure above [id number] or diastolic pressure above 123456: Begin antihypertensive therapy; follow up with your caregiver within 1 week.   This information is not intended to replace advice given to you by your health care provider. Make sure you discuss any questions you have with your health care provider.   Document Released: 04/20/2012 Document Reviewed: 04/20/2012 Elsevier Interactive Patient Education [employer].

## 2016-06-02 NOTE — Assessment & Plan Note (Signed)
LFTs normal. Asymptomatic. Pending right upper quadrant ultrasound to evaluate severity of fatty liver disease.

## 2016-06-02 NOTE — Addendum Note (Signed)
Addended by: Leeanne Rio on: 06/02/2016 02:33 PM   Modules accepted: Orders

## 2016-06-12 ENCOUNTER — Ambulatory Visit: Payer: 59

## 2016-06-12 NOTE — Progress Notes (Signed)
Patient stated that he feels like it is better but he will take BP when he arrives at home later tonight and let us know the numbers.  Patient also stated that he missed his [place] appointment.

## 2016-06-22 ENCOUNTER — Telehealth: Payer: Self-pay | Admitting: Family

## 2016-06-22 NOTE — Telephone Encounter (Signed)
Please call pt-  Please let him know that HCTZ ( which is in his pill with the lisinopril) can sometimes increase GOUT flares.  If he has had more gout flares recently, please let me know and I will stop the HCTZ and increase the lisinopril to 40mg  QD.   Please also ask what is BP's have been running. If  Over 140/90, he needs OV.

## 2016-06-23 NOTE — Telephone Encounter (Signed)
Unable to leave VM due to VM not being set up.

## 2016-06-23 NOTE — Progress Notes (Signed)
Unable tyo leav VM due to VM not being set up.

## 2016-06-24 NOTE — Telephone Encounter (Signed)
Unable to leave VM due to VM not being set up

## 2016-06-25 NOTE — Telephone Encounter (Signed)
Have sent My chart message informing patient.

## 2016-07-29 ENCOUNTER — Ambulatory Visit (INDEPENDENT_AMBULATORY_CARE_PROVIDER_SITE_OTHER): Payer: 59

## 2016-07-29 ENCOUNTER — Ambulatory Visit (INDEPENDENT_AMBULATORY_CARE_PROVIDER_SITE_OTHER): Payer: 59 | Admitting: Family Medicine

## 2016-07-29 ENCOUNTER — Encounter: Payer: Self-pay | Admitting: Family Medicine

## 2016-07-29 VITALS — BP 144/87 | HR 97 | Temp 98.2°F | Resp 16 | Wt 290.5 lb

## 2016-07-29 DIAGNOSIS — M25561 Pain in right knee: Secondary | ICD-10-CM

## 2016-07-29 MED ORDER — MELOXICAM 15 MG PO TABS: 15.0000 mg | ORAL_TABLET | Freq: Every day | ORAL | 0 refills | Status: DC

## 2016-07-29 NOTE — Assessment & Plan Note (Addendum)
New problem. Treating with Mobic. Obtaining x-ray.

## 2016-07-29 NOTE — Progress Notes (Addendum)
Subjective:  Patient ID: Jared Watkins, male    DOB: [birth year]  Age: 40 y.o. MRN: [id number]  CC: Right knee pain  HPI:  40 year old male presents with complaints of right knee pain.  Patient states that for the past 2 days he's had worsening right knee pain. He reports that his knee pops often and he has severe pain in the joint. He did hit his knee yesterday but this is not the culprit of his pain as it was preceded by this. No reports of swelling other than with the injury yesterday. Worse with activity. No no relieving factors. No medications tried. No other associated symptoms. No other complaints or concerns at this time.  Social Hx   Social History   Social History  . Marital status: Married    Spouse name: N/A  . Number of children: 2  . Years of education: N/A   Occupational History  . TRUCK Dealer Self    Owns business   Social History Main Topics  . Smoking status: Never Smoker  . Smokeless tobacco: Never Used  . Alcohol use Yes     Comment: Stopped when Lfts started to get elevated.   . Drug use: No  . Sexual activity: Not Asked   Other Topics Concern  . None   Social History Narrative   Lives in [place] with wife and 2 sons 30YO and [place].       Works - Building surveyor.        Diet: regular.    Regular Exercise -  NO   Daily Caffeine Use:  3-4/sodas a day   Review of Systems  Constitutional: Negative.   Musculoskeletal:       Right knee pain.   Objective:  BP (!) 144/87 (BP Location: Left Arm, Patient Position: Sitting, Cuff Size: Large)   Pulse 97   Temp 98.2 F (36.8 C) (Oral)   Resp 16   Wt 290 lb 8 oz (131.8 kg)   SpO2 97%   BMI 36.31 kg/m   BP/Weight 07/29/2016 06/02/2016 0000000  Systolic BP [id number]  Diastolic BP 87 [id number]  Wt. (Lbs) 290.5 292 296.8  BMI 36.31 36.5 37.1   Physical Exam  Constitutional: He is oriented to person, place, and time. He appears well-developed. No distress.  Pulmonary/Chest: Effort  normal.  Musculoskeletal:  Knee: Inspection - area of swelling medially (from recent injury). Palpation normal with no joint line tenderness, patellar tenderness. Ligaments with solid consistent endpoints including ACL, PCL, LCL, MCL. Negative Mcmurray's. Non painful patellar compression. Crepitus with extension of knee.   Neurological: He is alert and oriented to person, place, and time.  Psychiatric:  Flat affect.  Vitals reviewed.  Lab Results  Component Value Date   WBC 10.6 (H) 04/06/2016   HGB 15.4 04/06/2016   HCT 44.4 04/06/2016   PLT 348.0 04/06/2016   GLUCOSE 92 04/06/2016   CHOL 179 04/06/2016   TRIG 292.0 (H) 04/06/2016   HDL 43.50 04/06/2016   LDLDIRECT 99.0 04/06/2016   LDLCALC 118 (H) 11/20/2014   ALT 36 04/06/2016   AST 26 04/06/2016   NA 138 04/06/2016   K 4.0 04/06/2016   CL 101 04/06/2016   CREATININE 1.00 04/06/2016   BUN 16 04/06/2016   CO2 32 04/06/2016   TSH 0.91 04/06/2016   INR 1.1 (H) 02/19/2012   HGBA1C 5.3 04/06/2016   MICROALBUR 1.9 08/24/2012    Assessment & Plan:   Problem List Items Addressed This  Visit    Acute pain of right knee - Primary    New problem. Treating with Mobic. Obtaining x-ray.      Relevant Orders   DG Knee 3 Views Right (Completed)      Meds ordered this encounter  Medications  . meloxicam (MOBIC) 15 MG tablet    Sig: Take 1 tablet (15 mg total) by mouth daily.    Dispense:  30 tablet    Refill:  0    Follow-up: PRN  [place]

## 2016-07-29 NOTE — Patient Instructions (Signed)
Take the medication daily as needed.  We will call with the results.  Follow up closely with your PCP.  Take care  Dr. Lacinda Axon

## 2016-07-29 NOTE — Progress Notes (Signed)
Pre visit review using our clinic review tool, if applicable. No additional management support is needed unless otherwise documented below in the visit note. 

## 2016-08-06 ENCOUNTER — Other Ambulatory Visit: Payer: Self-pay | Admitting: Family

## 2016-08-06 DIAGNOSIS — I10 Essential (primary) hypertension: Secondary | ICD-10-CM

## 2016-08-13 ENCOUNTER — Other Ambulatory Visit: Payer: Self-pay

## 2016-08-13 DIAGNOSIS — I10 Essential (primary) hypertension: Secondary | ICD-10-CM

## 2016-08-13 MED ORDER — AMLODIPINE BESYLATE 10 MG PO TABS: 10.0000 mg | ORAL_TABLET | Freq: Every day | ORAL | 0 refills | Status: DC

## 2016-08-13 NOTE — Telephone Encounter (Signed)
Medication has been refilled.

## 2016-09-24 ENCOUNTER — Other Ambulatory Visit: Payer: Self-pay | Admitting: Family

## 2016-09-24 DIAGNOSIS — I10 Essential (primary) hypertension: Secondary | ICD-10-CM

## 2016-09-29 ENCOUNTER — Other Ambulatory Visit: Payer: Self-pay

## 2016-09-29 DIAGNOSIS — I10 Essential (primary) hypertension: Secondary | ICD-10-CM

## 2016-09-29 MED ORDER — AMLODIPINE BESYLATE 10 MG PO TABS
10.0000 mg | ORAL_TABLET | Freq: Every day | ORAL | 1 refills | Status: DC
Start: 2016-09-29 — End: 2016-10-30

## 2016-10-30 ENCOUNTER — Other Ambulatory Visit: Payer: Self-pay

## 2016-10-30 DIAGNOSIS — I10 Essential (primary) hypertension: Secondary | ICD-10-CM

## 2016-10-30 MED ORDER — AMLODIPINE BESYLATE 10 MG PO TABS: 10.0000 mg | ORAL_TABLET | Freq: Every day | ORAL | 1 refills | Status: DC

## 2016-12-10 ENCOUNTER — Other Ambulatory Visit: Payer: Self-pay | Admitting: Family

## 2016-12-10 DIAGNOSIS — K219 Gastro-esophageal reflux disease without esophagitis: Secondary | ICD-10-CM

## 2016-12-18 ENCOUNTER — Other Ambulatory Visit: Payer: Self-pay

## 2016-12-18 DIAGNOSIS — K219 Gastro-esophageal reflux disease without esophagitis: Secondary | ICD-10-CM

## 2016-12-18 MED ORDER — OMEPRAZOLE 20 MG PO CPDR: 20.0000 mg | DELAYED_RELEASE_CAPSULE | Freq: Every day | ORAL | 3 refills | Status: DC

## 2016-12-18 NOTE — Telephone Encounter (Signed)
Medication has been refilled.

## 2017-04-22 ENCOUNTER — Ambulatory Visit (INDEPENDENT_AMBULATORY_CARE_PROVIDER_SITE_OTHER): Payer: 59 | Admitting: Family

## 2017-04-22 ENCOUNTER — Encounter: Payer: Self-pay | Admitting: Family

## 2017-04-22 VITALS — BP 150/110 | HR 114 | Temp 98.3°F | Ht 75.0 in | Wt 295.2 lb

## 2017-04-22 DIAGNOSIS — I10 Essential (primary) hypertension: Secondary | ICD-10-CM | POA: Diagnosis not present

## 2017-04-22 DIAGNOSIS — K219 Gastro-esophageal reflux disease without esophagitis: Secondary | ICD-10-CM | POA: Diagnosis not present

## 2017-04-22 DIAGNOSIS — F419 Anxiety disorder, unspecified: Secondary | ICD-10-CM

## 2017-04-22 DIAGNOSIS — G4733 Obstructive sleep apnea (adult) (pediatric): Secondary | ICD-10-CM

## 2017-04-22 DIAGNOSIS — E785 Hyperlipidemia, unspecified: Secondary | ICD-10-CM

## 2017-04-22 LAB — BASIC METABOLIC PANEL
BUN: 12 mg/dL (ref 6–23)
CHLORIDE: 101 meq/L (ref 96–112)
CO2: 27 meq/L (ref 19–32)
Calcium: 9.3 mg/dL (ref 8.4–10.5)
Creatinine, Ser: 1.04 mg/dL (ref 0.40–1.50)
GFR: 83.54 mL/min (ref >60.00)
GLUCOSE: 98 mg/dL (ref 70–99)
POTASSIUM: 3.7 meq/L (ref 3.5–5.1)
Sodium: 137 mEq/L (ref 135–145)

## 2017-04-22 LAB — MAGNESIUM: MAGNESIUM: 1.8 mg/dL (ref 1.5–2.5)

## 2017-04-22 LAB — TSH: TSH: 1.42 u[IU]/mL (ref 0.35–4.50)

## 2017-04-22 MED ORDER — ESCITALOPRAM OXALATE 20 MG PO TABS: 20.0000 mg | ORAL_TABLET | Freq: Every day | ORAL | 1 refills | Status: DC

## 2017-04-22 MED ORDER — METOPROLOL SUCCINATE ER 25 MG PO TB24: 25.0000 mg | ORAL_TABLET | Freq: Every day | ORAL | 0 refills | Status: DC

## 2017-04-22 NOTE — Assessment & Plan Note (Signed)
Based on lab results and health screening at work; he is low CVD risk at 2%. Discussed lifestyle modifications. Will follow.

## 2017-04-22 NOTE — Assessment & Plan Note (Addendum)
Worse of late. Suspect contributing to elevated heart rate today. start increased dose of Lexapro. Follow-up 2 weeks.

## 2017-04-22 NOTE — Assessment & Plan Note (Addendum)
Uncontrolled. Reassured is no abdominal pain, absence of weight loss. On Prilosec daily. Referral to GI for further evaluation, possibly EGD. Pending labs for H. Pylori, stool cards.

## 2017-04-22 NOTE — Assessment & Plan Note (Signed)
Has not been wearing CPAP machine. Discussed with him at great length risks for cardiac and stroke if not compliant with CPAP.  placed a new order for CPAP machine titration. Patient may require a new sleep study as lasted was  2013. Will follow

## 2017-04-22 NOTE — Assessment & Plan Note (Addendum)
Elevated today however  blood pressure did came down to patient rest in room. His heart rate remained elevated. No signs of hypertensive urgency or emergency at this time. No complaints of chest pain, palpitations. Patient does state he is rather anxious and has been irritable, which we jointly agreed contributory to heart rate this morning. Patient will watch very closely ,and I have asked him to call us with heart rate, blood pressure after starting the metoprolol. Pending additional lab studies for tachycardia.

## 2017-04-22 NOTE — Patient Instructions (Addendum)
Blood work today  Referral to [employer] cards to ensure no blood  Increase lexapro to 20mg   Start metoprolol   Cipap machine ordered  Follow up in 2 weeks to ensure blood pressure, heart rate has improved, sooner if not. Please call with blood pressure and heart rate tomorrow. Goal of blood pressure is < 130/80 and heart rate is less than 100 beats per minute.    Managing Your Hypertension Hypertension is commonly called high blood pressure. This is when the force of your blood pressing against the walls of your arteries is too strong. Arteries are blood vessels that carry blood from your heart throughout your body. Hypertension forces the heart to work harder to pump blood, and may cause the arteries to become narrow or stiff. Having untreated or uncontrolled hypertension can cause heart attack, stroke, kidney disease, and other problems. What are blood pressure readings? A blood pressure reading consists of a higher number over a lower number. Ideally, your blood pressure should be below 120/80. The first ("top") number is called the systolic pressure. It is a measure of the pressure in your arteries as your heart beats. The second ("bottom") number is called the diastolic pressure. It is a measure of the pressure in your arteries as the heart relaxes. What does my blood pressure reading mean? Blood pressure is classified into four stages. Based on your blood pressure reading, your health care provider may use the following stages to determine what type of treatment you need, if any. Systolic pressure and diastolic pressure are measured in a unit called mm Hg. Normal  Systolic pressure: below 258.  Diastolic pressure: below 80. Elevated  Systolic pressure: 527-782.  Diastolic pressure: below 80. Hypertension stage 1  Systolic pressure: 423-536.  Diastolic pressure: 14-43. Hypertension stage 2  Systolic pressure: 154 or above.  Diastolic pressure: 90 or above. What health  risks are associated with hypertension? Managing your hypertension is an important responsibility. Uncontrolled hypertension can lead to:  A heart attack.  A stroke.  A weakened blood vessel (aneurysm).  Heart failure.  Kidney damage.  Eye damage.  Metabolic syndrome.  Memory and concentration problems.  What changes can I make to manage my hypertension? Hypertension can be managed by making lifestyle changes and possibly by taking medicines. Your health care provider will help you make a plan to bring your blood pressure within a normal range. Eating and drinking  Eat a diet that is high in fiber and potassium, and low in salt (sodium), added sugar, and fat. An example eating plan is called the DASH (Dietary Approaches to Stop Hypertension) diet. To eat this way: ? Eat plenty of fresh fruits and vegetables. Try to fill half of your plate at each meal with fruits and vegetables. ? Eat whole grains, such as whole wheat pasta, brown rice, or whole grain bread. Fill about one quarter of your plate with whole grains. ? Eat low-fat diary products. ? Avoid fatty cuts of meat, processed or cured meats, and poultry with skin. Fill about one quarter of your plate with lean proteins such as fish, chicken without skin, beans, eggs, and tofu. ? Avoid premade and processed foods. These tend to be higher in sodium, added sugar, and fat.  Reduce your daily sodium intake. Most people with hypertension should eat less than 1,500 mg of sodium a day.  Limit alcohol intake to no more than 1 drink a day for nonpregnant women and 2 drinks a day for men. One  drink equals 12 oz of beer, 5 oz of wine, or 1 oz of hard liquor. Lifestyle  Work with your health care provider to maintain a healthy body weight, or to lose weight. Ask what an ideal weight is for you.  Get at least 30 minutes of exercise that causes your heart to beat faster (aerobic exercise) most days of the week. Activities may include  walking, swimming, or biking.  Include exercise to strengthen your muscles (resistance exercise), such as weight lifting, as part of your weekly exercise routine. Try to do these types of exercises for 30 minutes at least 3 days a week.  Do not use any products that contain nicotine or tobacco, such as cigarettes and e-cigarettes. If you need help quitting, ask your health care provider.  Control any long-term (chronic) conditions you have, such as high cholesterol or diabetes. Monitoring  Monitor your blood pressure at home as told by your health care provider. Your personal target blood pressure may vary depending on your medical conditions, your age, and other factors.  Have your blood pressure checked regularly, as often as told by your health care provider. Working with your health care provider  Review all the medicines you take with your health care provider because there may be side effects or interactions.  Talk with your health care provider about your diet, exercise habits, and other lifestyle factors that may be contributing to hypertension.  Visit your health care provider regularly. Your health care provider can help you create and adjust your plan for managing hypertension. Will I need medicine to control my blood pressure? Your health care provider may prescribe medicine if lifestyle changes are not enough to get your blood pressure under control, and if:  Your systolic blood pressure is 130 or higher.  Your diastolic blood pressure is 80 or higher.  Take medicines only as told by your health care provider. Follow the directions carefully. Blood pressure medicines must be taken as prescribed. The medicine does not work as well when you skip doses. Skipping doses also puts you at risk for problems. Contact a health care provider if:  You think you are having a reaction to medicines you have taken.  You have repeated (recurrent) headaches.  You feel dizzy.  You have  swelling in your ankles.  You have trouble with your vision. Get help right away if:  You develop a severe headache or confusion.  You have unusual weakness or numbness, or you feel faint.  You have severe pain in your chest or abdomen.  You vomit repeatedly.  You have trouble breathing. Summary  Hypertension is when the force of blood pumping through your arteries is too strong. If this condition is not controlled, it may put you at risk for serious complications.  Your personal target blood pressure may vary depending on your medical conditions, your age, and other factors. For most people, a normal blood pressure is less than 120/80.  Hypertension is managed by lifestyle changes, medicines, or both. Lifestyle changes include weight loss, eating a healthy, low-sodium diet, exercising more, and limiting alcohol. This information is not intended to replace advice given to you by your health care provider. Make sure you discuss any questions you have with your health care provider. Document Released: 04/20/2012 Document Revised: 06/24/2016 Document Reviewed: 06/24/2016 Elsevier Interactive Patient Education  [employer].
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
## 2017-04-23 LAB — H. PYLORI BREATH TEST: H. PYLORI BREATH TEST: DETECTED — AB

## 2017-04-23 NOTE — Progress Notes (Signed)
Tried to call patient, was unable to leave VM due to it not being set up.

## 2017-04-26 ENCOUNTER — Telehealth: Payer: Self-pay | Admitting: Family

## 2017-04-26 DIAGNOSIS — Z8619 Personal history of other infectious and parasitic diseases: Secondary | ICD-10-CM | POA: Insufficient documentation

## 2017-04-26 DIAGNOSIS — A048 Other specified bacterial intestinal infections: Secondary | ICD-10-CM

## 2017-04-26 NOTE — Telephone Encounter (Signed)
Called patient but was unable to leave VM due to vm not being set up.

## 2017-04-26 NOTE — Telephone Encounter (Signed)
Called pt over weekend to tell him + hpylori. Called in amoxicllin and clarithromycin.advised to increase prilosec to bid.  He stated bp better SBP 145.  No chest pain.     Jared Watkins,   Please call pt and advise him to make a follow up with [place] in 4 weeks, sooner if any symptoms. We will need to recheck h pylori with breath test ( pended).   He will need to HOLD prilosec completely for one to 2 weeks prior to appointment to ensure that we get an accurate result   prilosec can make the test appear negative and I want to ensure all h pylori infection has been eradicated

## 2017-04-27 LAB — H PYLORI, IGM, IGG, IGA AB
H. pylori, IgA Abs: <9 units (ref 0.0–8.9)
H. pylori, IgG AbS: <0.8 Index Value (ref 0.00–0.79)

## 2017-04-29 NOTE — Telephone Encounter (Signed)
Mail letter please

## 2017-04-29 NOTE — Telephone Encounter (Signed)
Letter has been mailed.

## 2017-04-29 NOTE — Telephone Encounter (Signed)
Called patient but was unable to leave VM due to vm not being set up.

## 2017-06-24 ENCOUNTER — Other Ambulatory Visit: Payer: Self-pay | Admitting: Family

## 2017-06-24 DIAGNOSIS — I10 Essential (primary) hypertension: Secondary | ICD-10-CM

## 2017-06-30 ENCOUNTER — Encounter: Payer: Self-pay | Admitting: Family

## 2017-09-23 ENCOUNTER — Ambulatory Visit (INDEPENDENT_AMBULATORY_CARE_PROVIDER_SITE_OTHER): Payer: Managed Care, Other (non HMO) | Admitting: Family Medicine

## 2017-09-23 ENCOUNTER — Encounter: Payer: Self-pay | Admitting: Family Medicine

## 2017-09-23 VITALS — BP 170/122 | HR 83 | Temp 97.7°F | Resp 18 | Ht 72.5 in | Wt 304.0 lb

## 2017-09-23 DIAGNOSIS — F1511 Other stimulant abuse, in remission: Secondary | ICD-10-CM

## 2017-09-23 DIAGNOSIS — I10 Essential (primary) hypertension: Secondary | ICD-10-CM | POA: Diagnosis not present

## 2017-09-23 DIAGNOSIS — F419 Anxiety disorder, unspecified: Secondary | ICD-10-CM

## 2017-09-23 DIAGNOSIS — K760 Fatty (change of) liver, not elsewhere classified: Secondary | ICD-10-CM | POA: Diagnosis not present

## 2017-09-23 DIAGNOSIS — F4310 Post-traumatic stress disorder, unspecified: Secondary | ICD-10-CM | POA: Diagnosis not present

## 2017-09-23 DIAGNOSIS — K219 Gastro-esophageal reflux disease without esophagitis: Secondary | ICD-10-CM | POA: Diagnosis not present

## 2017-09-23 DIAGNOSIS — M109 Gout, unspecified: Secondary | ICD-10-CM | POA: Diagnosis not present

## 2017-09-23 DIAGNOSIS — Z87898 Personal history of other specified conditions: Secondary | ICD-10-CM

## 2017-09-23 MED ORDER — LORAZEPAM 0.5 MG PO TABS: 0.5000 mg | ORAL_TABLET | Freq: Two times a day (BID) | ORAL | 1 refills | Status: DC | PRN

## 2017-09-23 MED ORDER — ALLOPURINOL 100 MG PO TABS: 100.0000 mg | ORAL_TABLET | Freq: Every day | ORAL | 2 refills | Status: DC

## 2017-09-23 MED ORDER — METOPROLOL SUCCINATE ER 25 MG PO TB24: 25.0000 mg | ORAL_TABLET | Freq: Every day | ORAL | 2 refills | Status: DC

## 2017-09-23 MED ORDER — LISINOPRIL-HYDROCHLOROTHIAZIDE 10-12.5 MG PO TABS: 1.0000 | ORAL_TABLET | Freq: Every day | ORAL | 2 refills | Status: DC

## 2017-09-23 MED ORDER — ESCITALOPRAM OXALATE 20 MG PO TABS: 20.0000 mg | ORAL_TABLET | Freq: Every day | ORAL | 1 refills | Status: DC

## 2017-09-23 MED ORDER — AMLODIPINE BESYLATE 10 MG PO TABS: 10.0000 mg | ORAL_TABLET | Freq: Every day | ORAL | 2 refills | Status: DC
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
## 2017-10-20 ENCOUNTER — Encounter: Payer: Self-pay | Admitting: Family Medicine

## 2017-10-26 ENCOUNTER — Encounter: Payer: Self-pay | Admitting: Family Medicine

## 2017-10-26 ENCOUNTER — Ambulatory Visit (INDEPENDENT_AMBULATORY_CARE_PROVIDER_SITE_OTHER): Payer: Managed Care, Other (non HMO) | Admitting: Family Medicine

## 2017-10-26 VITALS — BP 148/104 | HR 102 | Temp 98.7°F | Resp 16

## 2017-10-26 DIAGNOSIS — L03313 Cellulitis of chest wall: Secondary | ICD-10-CM | POA: Diagnosis not present

## 2017-10-26 MED ORDER — SULFAMETHOXAZOLE-TRIMETHOPRIM 800-160 MG PO TABS: 2.0000 | ORAL_TABLET | Freq: Two times a day (BID) | ORAL | 0 refills | Status: DC

## 2017-10-26 MED ORDER — CEFTRIAXONE SODIUM 500 MG IJ SOLR
500.0000 mg | Freq: Once | INTRAMUSCULAR | Status: AC
  Administered 2017-10-26: 500 mg via INTRAMUSCULAR

## 2017-10-26 MED ORDER — SULFAMETHOXAZOLE-TRIMETHOPRIM 800-160 MG PO TABS: 2.0000 | ORAL_TABLET | Freq: Two times a day (BID) | ORAL | 0 refills | Status: AC

## 2017-10-26 MED ORDER — CEFTRIAXONE SODIUM 1 G IJ SOLR: 1.0000 g | Freq: Once | INTRAMUSCULAR | Status: DC

## 2017-10-26 NOTE — Patient Instructions (Signed)
   Go to emergency room if you have any fevers, chills, sweats or worsening of pain and redness on your chest.

## 2017-10-26 NOTE — Progress Notes (Signed)
Patient: Jared Watkins Male    DOB: 11/23/1975   41 y.o.   MRN: [id number] Visit Date: 10/26/2017  Today's Provider: Lelon Huh, MD   Chief Complaint  Patient presents with  . Insect Bite   Subjective:    Patient has redness and swelling on right breast x2 days. Patient states he doesn't know if an insect bit him or not. Patient states his breast is very painful and hot to the touch. Patient has been taking an otc allergy medication.   No known injuries. Has increased in sized since yesterday. No fevers, chills or sweats. Has taken OTC pain medications without relief.     No Known Allergies   Current Outpatient Medications:  .  allopurinol (ZYLOPRIM) 100 MG tablet, Take 1 tablet (100 mg total) by mouth daily., Disp: 90 tablet, Rfl: 2 .  amLODipine (NORVASC) 10 MG tablet, Take 1 tablet (10 mg total) by mouth daily., Disp: 90 tablet, Rfl: 2 .  aspirin 81 MG EC tablet, Take 81 mg by mouth every other day. , Disp: , Rfl:  .  colchicine 0.6 MG tablet, TAKE 2 TABLETS TODAY, THEN  1 TABLET DAILY UNTIL  SYMPTOMS IMPROVED, Disp: 90 tablet, Rfl: 2 .  escitalopram (LEXAPRO) 20 MG tablet, Take 1 tablet (20 mg total) by mouth daily., Disp: 90 tablet, Rfl: 1 .  fluticasone (FLONASE) 50 MCG/ACT nasal spray, Place 2 sprays into both nostrils daily., Disp: 16 g, Rfl: 1 .  lisinopril-hydrochlorothiazide (PRINZIDE,ZESTORETIC) 10-12.5 MG tablet, Take 1 tablet by mouth daily., Disp: 90 tablet, Rfl: 2 .  LORazepam (ATIVAN) 0.5 MG tablet, Take 1 tablet (0.5 mg total) by mouth 2 (two) times daily as needed for anxiety., Disp: 30 tablet, Rfl: 1 .  metoprolol succinate (TOPROL-XL) 25 MG 24 hr tablet, Take 1 tablet (25 mg total) by mouth daily., Disp: 90 tablet, Rfl: 2 .  omeprazole (PRILOSEC) 20 MG capsule, Take 1 capsule (20 mg total) by mouth daily., Disp: 90 capsule, Rfl: 3  Review of Systems  Constitutional: Negative for appetite change, chills and fever.  Respiratory: Negative for chest  tightness, shortness of breath and wheezing.   Cardiovascular: Negative for chest pain and palpitations.  Gastrointestinal: Negative for abdominal pain, nausea and vomiting.    Social History   Tobacco Use  . Smoking status: Never Smoker  . Smokeless tobacco: Never Used  Substance Use Topics  . Alcohol use: Yes    Comment: occasional   Objective:   BP (!) 148/104 (BP Location: Right Arm, Patient Position: Sitting, Cuff Size: Large)   Pulse (!) 102   Temp 98.7 F (37.1 C) (Oral)   Resp 16   SpO2 98%  Vitals:   10/26/17 1359  BP: (!) 148/104  Pulse: (!) 102  Resp: 16  Temp: 98.7 F (37.1 C)  TempSrc: Oral  SpO2: 98%     Physical Exam  General appearance: alert, well developed, well nourished, cooperative and in no distress Head: Normocephalic, without obvious abnormality, atraumatic Respiratory: Respirations even and unlabored, normal respiratory rate Extremities: Large area of erythema about 10cm wide and 5cm tall centered around right areola. Tender to touch. Is well demarcated. No discharge. No lesion. Slight swellin g or areola noted.      Assessment & Plan:     1. Cellulitis of chest wall  - sulfamethoxazole-trimethoprim (BACTRIM DS,SEPTRA DS) 800-160 MG tablet; Take 2 tablets by mouth 2 (two) times daily for 10 days.  Dispense: 40 tablet; Refill: 0 -  cefTRIAXone (ROCEPHIN) injection 500 mg - cefTRIAXone (ROCEPHIN) injection 500 mg  Patient Instructions   Go to emergency room if you have any fevers, chills, sweats or worsening of pain and redness on your chest.   He is returning tomorrow for CPE and will reassess then.      Lelon Huh, MD  [facility]

## 2017-10-27 ENCOUNTER — Telehealth: Payer: Self-pay | Admitting: *Deleted

## 2017-10-27 ENCOUNTER — Encounter: Payer: Self-pay | Admitting: Family Medicine

## 2017-10-27 NOTE — Telephone Encounter (Signed)
Called pt to check on how he is doing since ov on 10/26/2017. No answer on pt # and no vm. Did leave message on pt's wife's (Dee) vm requesting a call back.

## 2017-10-29 NOTE — Telephone Encounter (Signed)
Wife returned call. States she believes he is doing fine. Is complaining of tenderness of the area. She is unsure if he is experiencing any other sx. She will ask pt to call back.

## 2017-10-29 NOTE — Telephone Encounter (Signed)
No answer on pt's # and no vm. LM on pt's wife Jared Watkins's vm to return call.

## 2017-11-02 ENCOUNTER — Encounter: Payer: Self-pay | Admitting: Family Medicine

## 2017-11-02 ENCOUNTER — Ambulatory Visit (INDEPENDENT_AMBULATORY_CARE_PROVIDER_SITE_OTHER): Payer: Managed Care, Other (non HMO) | Admitting: Family Medicine

## 2017-11-02 VITALS — BP 146/98 | HR 91 | Temp 98.5°F | Resp 16 | Ht 73.0 in | Wt 299.0 lb

## 2017-11-02 DIAGNOSIS — L03313 Cellulitis of chest wall: Secondary | ICD-10-CM

## 2017-11-02 DIAGNOSIS — K760 Fatty (change of) liver, not elsewhere classified: Secondary | ICD-10-CM | POA: Diagnosis not present

## 2017-11-02 DIAGNOSIS — I10 Essential (primary) hypertension: Secondary | ICD-10-CM

## 2017-11-02 DIAGNOSIS — Z23 Encounter for immunization: Secondary | ICD-10-CM

## 2017-11-02 DIAGNOSIS — F419 Anxiety disorder, unspecified: Secondary | ICD-10-CM | POA: Diagnosis not present

## 2017-11-02 DIAGNOSIS — Z0000 Encounter for general adult medical examination without abnormal findings: Secondary | ICD-10-CM | POA: Diagnosis not present

## 2017-11-02 NOTE — Telephone Encounter (Signed)
Patient was seen and evaluated in office today  
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
## 2017-11-03 LAB — COMPREHENSIVE METABOLIC PANEL
ALBUMIN: 4.7 g/dL (ref 3.5–5.5)
ALT: 58 IU/L — ABNORMAL HIGH (ref 0–44)
AST: 56 IU/L — ABNORMAL HIGH (ref 0–40)
Albumin/Globulin Ratio: 1.7 (ref 1.2–2.2)
Alkaline Phosphatase: 147 IU/L — ABNORMAL HIGH (ref 39–117)
BUN / CREAT RATIO: 21 — AB (ref 9–20)
BUN: 22 mg/dL (ref 6–24)
Bilirubin Total: 0.4 mg/dL (ref 0.0–1.2)
CO2: 25 mmol/L (ref 20–29)
CREATININE: 1.05 mg/dL (ref 0.76–1.27)
Calcium: 9.7 mg/dL (ref 8.7–10.2)
Chloride: 98 mmol/L (ref 96–106)
GFR, EST AFRICAN AMERICAN: 101 mL/min/{1.73_m2} (ref >59)
GFR, EST NON AFRICAN AMERICAN: 88 mL/min/{1.73_m2} (ref >59)
GLOBULIN, TOTAL: 2.7 g/dL (ref 1.5–4.5)
Glucose: 81 mg/dL (ref 65–99)
Potassium: 4.3 mmol/L (ref 3.5–5.2)
SODIUM: 138 mmol/L (ref 134–144)
TOTAL PROTEIN: 7.4 g/dL (ref 6.0–8.5)

## 2017-11-03 LAB — LIPID PANEL
CHOL/HDL RATIO: 4 ratio (ref 0.0–5.0)
Cholesterol, Total: 198 mg/dL (ref 100–199)
HDL: 49 mg/dL (ref >39)
LDL Calculated: 127 mg/dL — ABNORMAL HIGH (ref 0–99)
Triglycerides: 110 mg/dL (ref 0–149)
VLDL Cholesterol Cal: 22 mg/dL (ref 5–40)

## 2017-11-03 LAB — CBC
Hematocrit: 43.1 % (ref 37.5–51.0)
Hemoglobin: 14.8 g/dL (ref 13.0–17.7)
MCH: 28.6 pg (ref 26.6–33.0)
MCHC: 34.3 g/dL (ref 31.5–35.7)
MCV: 83 fL (ref 79–97)
PLATELETS: 319 10*3/uL (ref 150–379)
RBC: 5.17 x10E6/uL (ref 4.14–5.80)
RDW: 13.8 % (ref 12.3–15.4)
WBC: 8.4 10*3/uL (ref 3.4–10.8)

## 2017-11-03 LAB — TSH: TSH: 1.39 u[IU]/mL (ref 0.450–4.500)

## 2017-11-04 ENCOUNTER — Telehealth: Payer: Self-pay

## 2017-11-04 NOTE — Telephone Encounter (Signed)
-----   Message from Birdie Sons, MD sent at 11/03/2017  7:28 AM EDT ----- Liver functions are elevated probably due to fatty liver. Should not drink alcohol at all. Should avoid OTC pain relievers like tylenol and motrin.  Cholesterol is slightly high, but not enough to require medications. Rest of labs are normal.  Need to change lisinopril-hctz to 20-12.5mg  daily #30, rf x 2,  Follow up office visit for BP check in 6 weeks.

## 2017-11-04 NOTE — Telephone Encounter (Signed)
Tried calling patient no answer. Unable to leave VM to box not being set up.

## 2017-11-08 NOTE — Telephone Encounter (Signed)
No answer and no vm. Will try again later.  

## 2017-11-10 NOTE — Telephone Encounter (Signed)
No answer no VM. Unable to contact the patient. Letter was mailed advising of results, and to call the office about medication changes. Will save message to the chart.

## 2017-12-01 ENCOUNTER — Telehealth: Payer: Self-pay

## 2017-12-01 DIAGNOSIS — M109 Gout, unspecified: Secondary | ICD-10-CM

## 2017-12-01 DIAGNOSIS — F419 Anxiety disorder, unspecified: Secondary | ICD-10-CM

## 2017-12-01 NOTE — Telephone Encounter (Signed)
Patients wife is calling request a Refill on the following : Colchine, Allopurinol, Ativan, Lexapro. Sent to [facility] on [address]. Wife states that patient is leaving out of town on Friday and needs this filled ASAP. I informed wife that [name] is out of office but message will be forwarded to MD for review. KW

## 2017-12-01 NOTE — Telephone Encounter (Signed)
5 rf on everything --1 rf on Ativan.thx

## 2017-12-02 MED ORDER — LORAZEPAM 0.5 MG PO TABS: 0.5000 mg | ORAL_TABLET | Freq: Two times a day (BID) | ORAL | 1 refills | Status: DC | PRN

## 2017-12-02 MED ORDER — ALLOPURINOL 100 MG PO TABS: 100.0000 mg | ORAL_TABLET | Freq: Every day | ORAL | 2 refills | Status: DC

## 2017-12-02 MED ORDER — ESCITALOPRAM OXALATE 20 MG PO TABS: 20.0000 mg | ORAL_TABLET | Freq: Every day | ORAL | 1 refills | Status: DC

## 2017-12-02 MED ORDER — COLCHICINE 0.6 MG PO TABS: ORAL_TABLET | ORAL | 0 refills | Status: DC

## 2017-12-02 NOTE — Telephone Encounter (Signed)
Please see message below from yesterday

## 2018-01-25 ENCOUNTER — Ambulatory Visit: Payer: Managed Care, Other (non HMO) | Admitting: Family Medicine

## 2018-01-25 ENCOUNTER — Ambulatory Visit: Payer: Self-pay | Admitting: Family Medicine

## 2018-01-25 ENCOUNTER — Encounter: Payer: Self-pay | Admitting: Family Medicine

## 2018-01-25 VITALS — BP 128/92 | HR 62 | Temp 97.5°F | Resp 16 | Wt 301.0 lb

## 2018-01-25 DIAGNOSIS — R197 Diarrhea, unspecified: Secondary | ICD-10-CM

## 2018-01-25 DIAGNOSIS — F419 Anxiety disorder, unspecified: Secondary | ICD-10-CM

## 2018-01-25 MED ORDER — METRONIDAZOLE 500 MG PO TABS: 500.0000 mg | ORAL_TABLET | Freq: Two times a day (BID) | ORAL | 0 refills | Status: DC

## 2018-01-25 MED ORDER — LORAZEPAM 0.5 MG PO TABS: 0.5000 mg | ORAL_TABLET | Freq: Two times a day (BID) | ORAL | 1 refills | Status: DC | PRN

## 2018-01-25 NOTE — Patient Instructions (Signed)
   Call for order for stool tests if not much better within 3 days.      Diarrhea, Adult Diarrhea is frequent loose and watery bowel movements. Diarrhea can make you feel weak and cause you to become dehydrated. Dehydration can make you tired and thirsty, cause you to have a dry mouth, and decrease how often you urinate. Diarrhea typically lasts 2-3 days. However, it can last longer if it is a sign of something more serious. It is important to treat your diarrhea as told by your health care provider. Follow these instructions at home: Eating and drinking  Follow these recommendations as told by your health care provider:  Take an oral rehydration solution (ORS). This is a drink that is sold at pharmacies and retail stores.  Drink clear fluids, such as water, ice chips, diluted fruit juice, and low-calorie sports drinks.  Eat bland, easy-to-digest foods in small amounts as you are able. These foods include bananas, applesauce, rice, lean meats, toast, and crackers.  Avoid drinking fluids that contain a lot of sugar or caffeine, such as energy drinks, sports drinks, and soda.  Avoid alcohol.  Avoid spicy or fatty foods.  General instructions  Drink enough fluid to keep your urine clear or pale yellow.  Wash your hands often. If soap and water are not available, use hand sanitizer.  Make sure that all people in your household wash their hands well and often.  Take over-the-counter and prescription medicines only as told by your health care provider.  Rest at home while you recover.  Watch your condition for any changes.  Take a warm bath to relieve any burning or pain from frequent diarrhea episodes.  Keep all follow-up visits as told by your health care provider. This is important. Contact a health care provider if:  You have a fever.  Your diarrhea gets worse.  You have new symptoms.  You cannot keep fluids down.  You feel light-headed or dizzy.  You have a  headache  You have muscle cramps. Get help right away if:  You have chest pain.  You feel extremely weak or you faint.  You have bloody or black stools or stools that look like tar.  You have severe pain, cramping, or bloating in your abdomen.  You have trouble breathing or you are breathing very quickly.  Your heart is beating very quickly.  Your skin feels cold and clammy.  You feel confused.  You have signs of dehydration, such as: ? Dark urine, very little urine, or no urine. ? Cracked lips. ? Dry mouth. ? Sunken eyes. ? Sleepiness. ? Weakness. This information is not intended to replace advice given to you by your health care provider. Make sure you discuss any questions you have with your health care provider. Document Released: 07/17/2002 Document Revised: 12/05/2015 Document Reviewed: 04/02/2015 Elsevier Interactive Patient Education  [employer].

## 2018-01-25 NOTE — Progress Notes (Signed)
Patient: Jared Watkins Male    DOB: [birth year]   42 y.o.   MRN: [id number] Visit Date: 01/25/2018  Today's Provider: Lelon Huh, MD   Chief Complaint  Patient presents with  . Diarrhea   Subjective:    Diarrhea   This is a new problem. The current episode started in the past 7 days. The problem has been unchanged. Associated symptoms include bloating and increased flatus. Pertinent negatives include no abdominal pain, arthralgias, chills, coughing, fever, headaches, myalgias, sweats, URI, vomiting or weight loss. Nothing aggravates the symptoms. There are no known risk factors. He has tried increased fluids for the symptoms.    Patient states he has had diarrhea for 1 week. No nausea or vomiting. No blood in emesis.Patient states he feels fatigued and has no energy. Patient also states he has had no appetite. Patient states he has also been very bloated and gassy. Patient states he has been drinking lots of water and Gatorade. Has not taken colchicine for a few weeks.   He is also here regarding anxiety and been doing better on escitalopram, however he never picked up prescription for lorazepam that was sent a few months ago and requests prescription.   No Known Allergies   Current Outpatient Medications:  .  allopurinol (ZYLOPRIM) 100 MG tablet, Take 1 tablet (100 mg total) by mouth daily., Disp: 90 tablet, Rfl: 2 .  amLODipine (NORVASC) 10 MG tablet, Take 1 tablet (10 mg total) by mouth daily., Disp: 90 tablet, Rfl: 2 .  aspirin 81 MG EC tablet, Take 81 mg by mouth every other day. , Disp: , Rfl:  .  colchicine 0.6 MG tablet, TAKE 2 TABLETS TODAY, THEN  1 TABLET DAILY UNTIL  SYMPTOMS IMPROVED, Disp: 90 tablet, Rfl: 0 .  escitalopram (LEXAPRO) 20 MG tablet, Take 1 tablet (20 mg total) by mouth daily., Disp: 90 tablet, Rfl: 1 .  fluticasone (FLONASE) 50 MCG/ACT nasal spray, Place 2 sprays into both nostrils daily., Disp: 16 g, Rfl: 1 .  lisinopril-hydrochlorothiazide  (PRINZIDE,ZESTORETIC) 10-12.5 MG tablet, Take 1 tablet by mouth daily., Disp: 90 tablet, Rfl: 2 .  LORazepam (ATIVAN) 0.5 MG tablet, Take 1 tablet (0.5 mg total) by mouth 2 (two) times daily as needed for anxiety., Disp: 30 tablet, Rfl: 1 .  metoprolol succinate (TOPROL-XL) 25 MG 24 hr tablet, Take 1 tablet (25 mg total) by mouth daily., Disp: 90 tablet, Rfl: 2 .  omeprazole (PRILOSEC) 20 MG capsule, Take 1 capsule (20 mg total) by mouth daily., Disp: 90 capsule, Rfl: 3  Review of Systems  Constitutional: Positive for appetite change. Negative for chills, fever and weight loss.  Respiratory: Negative for cough, chest tightness, shortness of breath and wheezing.   Cardiovascular: Negative for chest pain and palpitations.  Gastrointestinal: Positive for bloating, diarrhea and flatus. Negative for abdominal pain, nausea and vomiting.  Musculoskeletal: Negative for arthralgias and myalgias.  Neurological: Negative for headaches.    Social History   Tobacco Use  . Smoking status: Never Smoker  . Smokeless tobacco: Never Used  Substance Use Topics  . Alcohol use: Yes    Comment: occasional   Objective:   BP (!) 128/92 (BP Location: Right Arm, Patient Position: Sitting, Cuff Size: Large)   Pulse 62   Temp (!) 97.5 F (36.4 C) (Oral)   Resp 16   Wt (!) 301 lb (136.5 kg)   SpO2 98%   BMI 39.71 kg/m  Vitals:   01/25/18 1352  BP: (!) 128/92  Pulse: 62  Resp: 16  Temp: (!) 97.5 F (36.4 C)  TempSrc: Oral  SpO2: 98%  Weight: (!) 301 lb (136.5 kg)     Physical Exam    General Appearance:    Alert, cooperative, no distress, obese  Eyes:    PERRL, conjunctiva/corneas clear, EOM's intact       Lungs:     Clear to auscultation bilaterally, respirations unlabored  Heart:    Regular rate and rhythm  Neurologic:   Awake, alert, oriented x 3. No apparent focal neurological           defect.           Assessment & Plan:     1. Anxiety  - LORazepam (ATIVAN) 0.5 MG tablet; Take 1  tablet (0.5 mg total) by mouth 2 (two) times daily as needed for anxiety.  Dispense: 30 tablet; Refill: 1  2. Diarrhea, unspecified type  - metroNIDAZOLE (FLAGYL) 500 MG tablet; Take 1 tablet (500 mg total) by mouth 2 (two) times daily.  Dispense: 14 tablet; Refill: 0  Call to order stool studies if not greatly improved in 3-4 days.         Lelon Huh, MD  [facility]

## 2018-05-13 ENCOUNTER — Ambulatory Visit: Payer: Managed Care, Other (non HMO) | Admitting: Family Medicine

## 2018-05-13 ENCOUNTER — Encounter: Payer: Self-pay | Admitting: Family Medicine

## 2018-05-13 DIAGNOSIS — Z23 Encounter for immunization: Secondary | ICD-10-CM | POA: Diagnosis not present

## 2018-05-13 DIAGNOSIS — F419 Anxiety disorder, unspecified: Secondary | ICD-10-CM

## 2018-05-13 NOTE — Progress Notes (Signed)
Patient: Jared Watkins Male    DOB: [birth year]   42 y.o.   MRN: [id number] Visit Date: 05/13/2018  Today's Provider: Lelon Huh, MD   Chief Complaint  Patient presents with  . Follow-up    patient needs form filled out for employer.  Marland Kitchen Anxiety   Subjective:    HPI  Patient is here to get BMI appeal form completed for work.   Patient was last seen in the office 7 months ago. He was counseled regarding a prudent diet and starting regular exercise. He is not exercise. He eats whatever he wants, is not trying to reduce calories. Eats a lot of starchy foods, although not much sweets.   Wt Readings from Last 5 Encounters:  05/13/18 (!) 314 lb 3.2 oz (142.5 kg)  01/25/18 (!) 301 lb (136.5 kg)  11/02/17 299 lb (135.6 kg)  09/23/17 (!) 304 lb (137.9 kg)  04/22/17 295 lb 3.2 oz (133.9 kg)     Anxiety- 01/26/2018. No changes were made. Patient reports good compliance and good symptom control. She continues on escitalopram and feels his anxiety is controlled. Rarely takes lorazepam.   No Known Allergies   Current Outpatient Medications:  .  allopurinol (ZYLOPRIM) 100 MG tablet, Take 1 tablet (100 mg total) by mouth daily., Disp: 90 tablet, Rfl: 2 .  amLODipine (NORVASC) 10 MG tablet, Take 1 tablet (10 mg total) by mouth daily., Disp: 90 tablet, Rfl: 2 .  aspirin 81 MG EC tablet, Take 81 mg by mouth every other day. , Disp: , Rfl:  .  colchicine 0.6 MG tablet, TAKE 2 TABLETS TODAY, THEN  1 TABLET DAILY UNTIL  SYMPTOMS IMPROVED, Disp: 90 tablet, Rfl: 0 .  escitalopram (LEXAPRO) 20 MG tablet, Take 1 tablet (20 mg total) by mouth daily., Disp: 90 tablet, Rfl: 1 .  fluticasone (FLONASE) 50 MCG/ACT nasal spray, Place 2 sprays into both nostrils daily., Disp: 16 g, Rfl: 1 .  lisinopril-hydrochlorothiazide (PRINZIDE,ZESTORETIC) 10-12.5 MG tablet, Take 1 tablet by mouth daily., Disp: 90 tablet, Rfl: 2 .  LORazepam (ATIVAN) 0.5 MG tablet, Take 1 tablet (0.5 mg total) by mouth 2 (two)  times daily as needed for anxiety., Disp: 30 tablet, Rfl: 1 .  metoprolol succinate (TOPROL-XL) 25 MG 24 hr tablet, Take 1 tablet (25 mg total) by mouth daily., Disp: 90 tablet, Rfl: 2 .  omeprazole (PRILOSEC) 20 MG capsule, Take 1 capsule (20 mg total) by mouth daily., Disp: 90 capsule, Rfl: 3  Review of Systems  Constitutional: Negative for appetite change, chills and fever.  Respiratory: Negative for chest tightness, shortness of breath and wheezing.   Cardiovascular: Negative for chest pain and palpitations.  Gastrointestinal: Negative for abdominal pain, nausea and vomiting.    Social History   Tobacco Use  . Smoking status: Never Smoker  . Smokeless tobacco: Never Used  Substance Use Topics  . Alcohol use: Yes    Comment: occasional   Objective:   BP 126/82   Pulse 80   Temp 98.8 F (37.1 C)   Resp 18   Ht 6\' 2"  (1.88 m)   Wt (!) 314 lb 3.2 oz (142.5 kg)   SpO2 98%   BMI 40.34 kg/m  Vitals:   05/13/18 1029  BP: 126/82  Pulse: 80  Resp: 18  Temp: 98.8 F (37.1 C)  SpO2: 98%  Weight: (!) 314 lb 3.2 oz (142.5 kg)  Height: 6\' 2"  (1.88 m)     Physical Exam  General Appearance:    Alert, cooperative, no distress, obese  Eyes:    PERRL, conjunctiva/corneas clear, EOM's intact       Lungs:     Clear to auscultation bilaterally, respirations unlabored  Heart:    Regular rate and rhythm  Neurologic:   Awake, alert, oriented x 3. No apparent focal neurological           defect.           Assessment & Plan:     1. Severe obesity (BMI >= 40) ([place]) Counseled regarding prudent diet and regular exercise.    2. Anxiety Stable continue current dose of escitalopram.   3. Flu vaccine need  - Flu Vaccine QUAD 6+ mos PF IM (Fluarix Quad PF)  Follow CPE 5-6 months.        Lelon Huh, MD  [facility]

## 2018-05-13 NOTE — Patient Instructions (Addendum)
   You need to engage in moderate exercise such as brisk walking for an average of 30 minutes per day. Reduce your calory intake by eating smaller meals and not getting second helpings. Avoid high glycemic index foods with a goal of losing 4-5 pounds per month to achieve and maintain a weight below 260 within the next 12 months.

## 2018-06-14 ENCOUNTER — Other Ambulatory Visit: Payer: Self-pay | Admitting: Family Medicine

## 2018-06-14 DIAGNOSIS — M109 Gout, unspecified: Secondary | ICD-10-CM

## 2018-06-14 NOTE — Telephone Encounter (Signed)
Patients wife states that patient is out medication. KW

## 2018-08-07 ENCOUNTER — Other Ambulatory Visit: Payer: Self-pay | Admitting: Family Medicine

## 2018-08-07 DIAGNOSIS — F419 Anxiety disorder, unspecified: Secondary | ICD-10-CM

## 2018-10-05 ENCOUNTER — Encounter: Payer: Self-pay | Admitting: Emergency Medicine

## 2018-10-05 ENCOUNTER — Ambulatory Visit (INDEPENDENT_AMBULATORY_CARE_PROVIDER_SITE_OTHER): Payer: Managed Care, Other (non HMO)

## 2018-10-05 ENCOUNTER — Other Ambulatory Visit: Payer: Self-pay

## 2018-10-05 ENCOUNTER — Ambulatory Visit
Admission: EM | Admit: 2018-10-05 | Discharge: 2018-10-05 | Disposition: A | Discharge status: Home / Self Care | Payer: Managed Care, Other (non HMO) | Attending: Family Medicine | Admitting: Family Medicine

## 2018-10-05 DIAGNOSIS — X500XXA Overexertion from strenuous movement or load, initial encounter: Secondary | ICD-10-CM | POA: Diagnosis not present

## 2018-10-05 DIAGNOSIS — M25561 Pain in right knee: Secondary | ICD-10-CM

## 2018-10-05 MED ORDER — PREDNISONE 10 MG PO TABS: ORAL_TABLET | ORAL | 0 refills | Status: DC

## 2018-10-05 NOTE — ED Triage Notes (Signed)
Patient c/o right knee swelling that started Friday. He stated he rested over the weekend and tried to go back to work on Monday and Tuesday and he was unable to due to the swelling and pain. Patient denies current injury.

## 2018-10-05 NOTE — Discharge Instructions (Signed)
Take medication as prescribed. Rest. Drink plenty of fluids. Ice.   Follow up with orthopedic this week for continued pain.   Follow up with your primary care physician this week as needed. Return to Urgent care for new or worsening concerns.
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
## 2018-10-11 ENCOUNTER — Other Ambulatory Visit: Payer: Self-pay

## 2018-10-11 DIAGNOSIS — I10 Essential (primary) hypertension: Secondary | ICD-10-CM

## 2018-10-11 MED ORDER — LISINOPRIL-HYDROCHLOROTHIAZIDE 10-12.5 MG PO TABS: 1.0000 | ORAL_TABLET | Freq: Every day | ORAL | 0 refills | Status: DC

## 2018-10-11 NOTE — Telephone Encounter (Signed)
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 Patient's wife Karena Addison called stating that patient is completely out of this medication. She states that they called for a refill of this medication for more than a month.

## 2018-11-10 ENCOUNTER — Other Ambulatory Visit: Payer: Self-pay

## 2018-11-10 DIAGNOSIS — I10 Essential (primary) hypertension: Secondary | ICD-10-CM

## 2018-11-10 MED ORDER — AMLODIPINE BESYLATE 10 MG PO TABS: 10.0000 mg | ORAL_TABLET | Freq: Every day | ORAL | 0 refills | Status: DC

## 2018-11-15 ENCOUNTER — Encounter: Payer: Self-pay | Admitting: Family Medicine

## 2018-12-06 ENCOUNTER — Other Ambulatory Visit: Payer: Self-pay | Admitting: Family Medicine

## 2018-12-06 DIAGNOSIS — M109 Gout, unspecified: Secondary | ICD-10-CM

## 2019-01-07 ENCOUNTER — Other Ambulatory Visit: Payer: Self-pay | Admitting: Family Medicine

## 2019-01-07 DIAGNOSIS — I10 Essential (primary) hypertension: Secondary | ICD-10-CM

## 2019-02-06 ENCOUNTER — Other Ambulatory Visit: Payer: Self-pay | Admitting: Family Medicine

## 2019-02-06 DIAGNOSIS — I10 Essential (primary) hypertension: Secondary | ICD-10-CM

## 2019-02-06 DIAGNOSIS — F419 Anxiety disorder, unspecified: Secondary | ICD-10-CM

## 2019-02-15 ENCOUNTER — Encounter: Payer: Self-pay | Admitting: Family Medicine

## 2019-03-08 ENCOUNTER — Encounter: Payer: Managed Care, Other (non HMO) | Admitting: Family Medicine

## 2019-05-12 ENCOUNTER — Encounter: Payer: Self-pay | Admitting: Family Medicine

## 2019-05-12 ENCOUNTER — Other Ambulatory Visit: Payer: Self-pay

## 2019-05-12 ENCOUNTER — Ambulatory Visit (INDEPENDENT_AMBULATORY_CARE_PROVIDER_SITE_OTHER): Payer: Managed Care, Other (non HMO) | Admitting: Family Medicine

## 2019-05-12 VITALS — BP 122/90 | HR 112 | Temp 96.6°F | Resp 22 | Ht 74.0 in | Wt 344.0 lb

## 2019-05-12 DIAGNOSIS — G4733 Obstructive sleep apnea (adult) (pediatric): Secondary | ICD-10-CM | POA: Diagnosis not present

## 2019-05-12 DIAGNOSIS — E790 Hyperuricemia without signs of inflammatory arthritis and tophaceous disease: Secondary | ICD-10-CM | POA: Insufficient documentation

## 2019-05-12 DIAGNOSIS — K760 Fatty (change of) liver, not elsewhere classified: Secondary | ICD-10-CM | POA: Diagnosis not present

## 2019-05-12 DIAGNOSIS — G2581 Restless legs syndrome: Secondary | ICD-10-CM | POA: Diagnosis not present

## 2019-05-12 DIAGNOSIS — Z23 Encounter for immunization: Secondary | ICD-10-CM | POA: Diagnosis not present

## 2019-05-12 DIAGNOSIS — Z0000 Encounter for general adult medical examination without abnormal findings: Secondary | ICD-10-CM

## 2019-05-12 DIAGNOSIS — E785 Hyperlipidemia, unspecified: Secondary | ICD-10-CM

## 2019-05-12 DIAGNOSIS — F4310 Post-traumatic stress disorder, unspecified: Secondary | ICD-10-CM

## 2019-05-12 DIAGNOSIS — I10 Essential (primary) hypertension: Secondary | ICD-10-CM

## 2019-05-12 DIAGNOSIS — M109 Gout, unspecified: Secondary | ICD-10-CM

## 2019-05-12 MED ORDER — METOPROLOL SUCCINATE ER 25 MG PO TB24: 12.5000 mg | ORAL_TABLET | Freq: Every day | ORAL | 4 refills | Status: DC

## 2019-05-12 MED ORDER — ROPINIROLE HCL 0.5 MG PO TABS: ORAL_TABLET | ORAL | 3 refills | Status: DC
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
## 2019-05-12 NOTE — Patient Instructions (Signed)
.   Please review the attached list of medications and notify my office if there are any errors.   . Please bring all of your medications to every appointment so we can make sure that our medication list is the same as yours.   . It is especially important to get the annual flu vaccine this year. If you haven't had it already, please go to your pharmacy or call the office as soon as possible to schedule you flu shot.  

## 2019-05-13 LAB — COMPREHENSIVE METABOLIC PANEL
ALT: 238 IU/L — ABNORMAL HIGH (ref 0–44)
AST: 209 IU/L — ABNORMAL HIGH (ref 0–40)
Albumin/Globulin Ratio: 1.6 (ref 1.2–2.2)
Albumin: 4.6 g/dL (ref 4.0–5.0)
Alkaline Phosphatase: 171 IU/L — ABNORMAL HIGH (ref 39–117)
BUN/Creatinine Ratio: 15 (ref 9–20)
BUN: 16 mg/dL (ref 6–24)
Bilirubin Total: 0.7 mg/dL (ref 0.0–1.2)
CO2: 22 mmol/L (ref 20–29)
Calcium: 10 mg/dL (ref 8.7–10.2)
Chloride: 98 mmol/L (ref 96–106)
Creatinine, Ser: 1.04 mg/dL (ref 0.76–1.27)
GFR calc Af Amer: 101 mL/min/{1.73_m2} (ref >59)
GFR calc non Af Amer: 88 mL/min/{1.73_m2} (ref >59)
Globulin, Total: 2.8 g/dL (ref 1.5–4.5)
Glucose: 108 mg/dL — ABNORMAL HIGH (ref 65–99)
Potassium: 4.2 mmol/L (ref 3.5–5.2)
Sodium: 138 mmol/L (ref 134–144)
Total Protein: 7.4 g/dL (ref 6.0–8.5)

## 2019-05-13 LAB — LIPID PANEL
Chol/HDL Ratio: 5.2 ratio — ABNORMAL HIGH (ref 0.0–5.0)
Cholesterol, Total: 239 mg/dL — ABNORMAL HIGH (ref 100–199)
HDL: 46 mg/dL (ref >39)
LDL Chol Calc (NIH): 168 mg/dL — ABNORMAL HIGH (ref 0–99)
Triglycerides: 137 mg/dL (ref 0–149)
VLDL Cholesterol Cal: 25 mg/dL (ref 5–40)

## 2019-05-13 LAB — URIC ACID: Uric Acid: 7.4 mg/dL (ref 3.7–8.6)

## 2019-05-16 ENCOUNTER — Telehealth: Payer: Self-pay

## 2019-05-16 NOTE — Telephone Encounter (Signed)
-----   Message from Birdie Sons, MD sent at 05/14/2019  9:17 AM EDT ----- Liver functions are very high, need to order abdominal ultrasound for elevated transaminases Need to avoid alcohol or limit to no more than 2 drinks in a day Uric acid levels are still high, need to increase allopurinol to 2 x 100mg  tablet daily, #60, rf x 6 to prevent gout flares Cholesterol is very high, need to avoid red meats.

## 2019-05-16 NOTE — Telephone Encounter (Signed)
Unable to reach patient at this time, voicemail box is not set up yet. KW

## 2019-05-20 ENCOUNTER — Other Ambulatory Visit: Payer: Self-pay | Admitting: Family Medicine

## 2019-05-20 DIAGNOSIS — M109 Gout, unspecified: Secondary | ICD-10-CM

## 2019-05-20 DIAGNOSIS — R7401 Elevation of levels of liver transaminase levels: Secondary | ICD-10-CM | POA: Insufficient documentation

## 2019-05-20 MED ORDER — ALLOPURINOL 100 MG PO TABS: 200.0000 mg | ORAL_TABLET | Freq: Every day | ORAL | 4 refills | Status: DC

## 2019-05-20 NOTE — Telephone Encounter (Signed)
[place] order placed.  New prescription for allopurinol sent to [facility] Message sent to patient via MyChart

## 2019-05-21 ENCOUNTER — Other Ambulatory Visit: Payer: Self-pay | Admitting: Family Medicine

## 2019-05-21 DIAGNOSIS — I10 Essential (primary) hypertension: Secondary | ICD-10-CM

## 2019-05-22 NOTE — Telephone Encounter (Signed)
Patient advised and verbally voiced understanding. Patient agrees with plan. Please schedule ultrasound.

## 2019-05-25 ENCOUNTER — Ambulatory Visit: Payer: Managed Care, Other (non HMO)

## 2019-06-26 ENCOUNTER — Other Ambulatory Visit: Payer: Self-pay | Admitting: Family Medicine

## 2019-06-26 DIAGNOSIS — K219 Gastro-esophageal reflux disease without esophagitis: Secondary | ICD-10-CM

## 2019-06-26 MED ORDER — OMEPRAZOLE 20 MG PO CPDR: 20.0000 mg | DELAYED_RELEASE_CAPSULE | Freq: Every day | ORAL | 3 refills | Status: DC

## 2019-06-26 NOTE — Telephone Encounter (Signed)
Requested medication (s) are due for refill today: no Requested medication (s) are on the active medication list: yes  Last refill:  12/2016  Future visit scheduled: no  Notes to clinic: last filled by different provider Review for refill   Requested Prescriptions  Pending Prescriptions Disp Refills   omeprazole (PRILOSEC) 20 MG capsule 90 capsule 3    Sig: Take 1 capsule (20 mg total) by mouth daily.     Gastroenterology: Proton Pump Inhibitors Failed - 06/26/2019  2:46 PM      Failed - Valid encounter within last 12 months    Recent Outpatient Visits          1 month ago Annual physical exam   [facility] Birdie Sons, MD   1 year ago Severe obesity (BMI >= 40) [facility]   [facility] Birdie Sons, MD   1 year ago Diarrhea, unspecified type   [facility] Birdie Sons, MD   1 year ago Annual physical exam   [facility] Birdie Sons, MD   1 year ago Cellulitis of chest wall   [facility] Birdie Sons, MD

## 2019-06-26 NOTE — Telephone Encounter (Signed)
Medication Refill - Medication:  omeprazole (PRILOSEC) 20 MG capsule  Has the patient contacted their pharmacy? Yes advised to call office.   Preferred Pharmacy (with phone number or street name):  [id number], [place] [phone] (Phone) [phone] (Fax)   Agent: Please be advised that RX refills may take up to 3 business days. We ask that you follow-up with your pharmacy.

## 2019-06-26 NOTE — Telephone Encounter (Signed)
Please review

## 2019-07-26 ENCOUNTER — Other Ambulatory Visit: Payer: Self-pay | Admitting: Family Medicine

## 2019-07-26 DIAGNOSIS — I10 Essential (primary) hypertension: Secondary | ICD-10-CM

## 2019-08-19 ENCOUNTER — Other Ambulatory Visit: Payer: Self-pay | Admitting: Family Medicine

## 2019-08-19 DIAGNOSIS — I10 Essential (primary) hypertension: Secondary | ICD-10-CM

## 2019-08-22 ENCOUNTER — Other Ambulatory Visit: Payer: Self-pay | Admitting: Family Medicine

## 2019-08-22 DIAGNOSIS — I10 Essential (primary) hypertension: Secondary | ICD-10-CM

## 2019-08-22 MED ORDER — METOPROLOL SUCCINATE ER 25 MG PO TB24: 12.5000 mg | ORAL_TABLET | Freq: Every day | ORAL | 4 refills | Status: DC

## 2019-08-22 NOTE — Telephone Encounter (Signed)
Medication Refill - Medication: metoprolol succinate (TOPROL-XL) 25 MG 24 hr tablet  Patient is all out of medication   Preferred Pharmacy (with phone number or street name):  [facility] [id number], [place] Phone:  [phone]  Fax:  [phone]       Agent: Please be advised that RX refills may take up to 3 business days. We ask that you follow-up with your pharmacy.

## 2019-08-22 NOTE — Telephone Encounter (Signed)
Requested medication (s) are due for refill today:yes  Requested medication (s) are on the active medication list: yes  Last refill: 05/12/2019  Future visit scheduled: no  Notes to clinic:  no valid encounter within last 6 months  Requested Prescriptions  Pending Prescriptions Disp Refills   metoprolol succinate (TOPROL-XL) 25 MG 24 hr tablet 45 tablet 4    Sig: Take 0.5 tablets (12.5 mg total) by mouth daily.      Cardiovascular:  Beta Blockers Failed - 08/22/2019  3:12 PM      Failed - Last BP in normal range    BP Readings from Last 1 Encounters:  05/12/19 122/90          Failed - Last Heart Rate in normal range    Pulse Readings from Last 1 Encounters:  05/12/19 (!) 112          Failed - Valid encounter within last 6 months    Recent Outpatient Visits           3 months ago Annual physical exam   [facility] Birdie Sons, MD   1 year ago Severe obesity (BMI >= 40) [facility])   [facility] Birdie Sons, MD   1 year ago Diarrhea, unspecified type   [facility] Birdie Sons, MD   1 year ago Annual physical exam   [facility] Birdie Sons, MD   1 year ago Cellulitis of chest wall   [facility] Birdie Sons, MD

## 2019-09-02 ENCOUNTER — Other Ambulatory Visit: Payer: Self-pay | Admitting: Family Medicine

## 2019-09-02 DIAGNOSIS — F419 Anxiety disorder, unspecified: Secondary | ICD-10-CM

## 2019-09-02 DIAGNOSIS — M109 Gout, unspecified: Secondary | ICD-10-CM

## 2019-09-02 NOTE — Telephone Encounter (Signed)
Requested medication (s) are due for refill today: yes  Requested medication (s) are on the active medication list: yes  Last refill:  06/24/2019  Future visit scheduled:no  Notes to clinic:  no valid encounter within last 12 months    Requested Prescriptions  Pending Prescriptions Disp Refills   escitalopram (LEXAPRO) 20 MG tablet [Pharmacy Med Name: ESCITALOPRAM 20MG  TABLETS] 90 tablet 2    Sig: TAKE 1 TABLET BY MOUTH DAILY      Psychiatry:  Antidepressants - SSRI Failed - 09/02/2019  6:21 AM      Failed - Valid encounter within last 6 months    Recent Outpatient Visits           3 months ago Annual physical exam   [facility] Birdie Sons, MD   1 year ago Severe obesity (BMI >= 40) ([place])   [facility] Birdie Sons, MD   1 year ago Diarrhea, unspecified type   [facility] Birdie Sons, MD   1 year ago Annual physical exam   [facility] Birdie Sons, MD   1 year ago Cellulitis of chest wall   [facility] Birdie Sons, MD                allopurinol (ZYLOPRIM) 100 MG tablet [Pharmacy Med Name: ALLOPURINOL 100MG  TABLETS] 90 tablet     Sig: TAKE 1 TABLET BY MOUTH DAILY      Endocrinology:  Gout Agents Failed - 09/02/2019  6:21 AM      Failed - Valid encounter within last 12 months    Recent Outpatient Visits           3 months ago Annual physical exam   [facility] Birdie Sons, MD   1 year ago Severe obesity (BMI >= 40) ([place])   [facility] Birdie Sons, MD   1 year ago Diarrhea, unspecified type   [facility] Birdie Sons, MD   1 year ago Annual physical exam   [facility] Birdie Sons, MD   1 year ago Cellulitis of chest wall   [facility] Birdie Sons, MD              Passed - Uric Acid in normal range and within 360 days    Uric Acid  Date Value Ref Range Status   05/12/2019 7.4 3.7 - 8.6 mg/dL Final    Comment:               Therapeutic target for gout patients: <6.0          Passed - Cr in normal range and within 360 days    Creat  Date Value Ref Range Status  01/05/2014 0.93 0.50 - 1.35 mg/dL Final   Creatinine, Ser  Date Value Ref Range Status  05/12/2019 1.04 0.76 - 1.27 mg/dL Final   Creatinine,U  Date Value Ref Range Status  08/24/2012 90.2 mg/dL Final

## 2019-09-04 NOTE — Telephone Encounter (Signed)
L.O.V. was on 05/12/2019 and no upcoming appointment.

## 2020-04-22 ENCOUNTER — Telehealth (INDEPENDENT_AMBULATORY_CARE_PROVIDER_SITE_OTHER): Payer: Managed Care, Other (non HMO) | Admitting: Physician Assistant

## 2020-04-22 ENCOUNTER — Other Ambulatory Visit: Payer: Self-pay | Admitting: Physician Assistant

## 2020-04-22 ENCOUNTER — Other Ambulatory Visit: Payer: Self-pay | Admitting: Family Medicine

## 2020-04-22 ENCOUNTER — Telehealth: Payer: Self-pay

## 2020-04-22 DIAGNOSIS — G4733 Obstructive sleep apnea (adult) (pediatric): Secondary | ICD-10-CM

## 2020-04-22 DIAGNOSIS — R0981 Nasal congestion: Secondary | ICD-10-CM

## 2020-04-22 DIAGNOSIS — J40 Bronchitis, not specified as acute or chronic: Secondary | ICD-10-CM

## 2020-04-22 MED ORDER — ALBUTEROL SULFATE HFA 108 (90 BASE) MCG/ACT IN AERS: 2.0000 | INHALATION_SPRAY | Freq: Four times a day (QID) | RESPIRATORY_TRACT | 2 refills | Status: DC | PRN

## 2020-04-22 MED ORDER — TOPIRAMATE 50 MG PO TABS: ORAL_TABLET | ORAL | 0 refills | Status: DC

## 2020-04-22 NOTE — Telephone Encounter (Signed)
Patient/pharmacy request changes to original Rx- non delegated Rx- requesting 90 days supply. Sent for review of request.

## 2020-04-22 NOTE — Telephone Encounter (Signed)
topiramate (TOPAMAX) 50 MG tablet 60 tablet 0 04/22/2020    Sig: 1/2 tablet every night for 14 days, then increase to 1 tablet every night for 14 days, then 2 tablets every night   Sent to pharmacy as: topiramate (TOPAMAX) 50 MG tablet   E-Prescribing Status: Receipt confirmed by pharmacy (04/22/2020 2:06 PM EDT)    Pt wife is calling in stating that [facility] has called them stating ins will not cover unless this is for 90 days vs 30 days. Acking for a resend

## 2020-04-22 NOTE — Telephone Encounter (Signed)
  Pt wife is calling in stating that [facility] has called them stating ins will not cover unless this is for 90 days vs 30 days

## 2020-04-22 NOTE — Progress Notes (Signed)
MyChart Video Visit    Virtual Visit via Video Note   This visit type was conducted due to national recommendations for restrictions regarding the COVID-19 Pandemic (e.g. social distancing) in an effort to limit this patient's exposure and mitigate transmission in our community. This patient is at least at moderate risk for complications without adequate follow up. This format is felt to be most appropriate for this patient at this time. Physical exam was limited by quality of the video and audio technology used for the visit.   Patient location: home Provider location: office   I discussed the limitations of evaluation and management by telemedicine and the availability of in person appointments. The patient expressed understanding and agreed to proceed.    Patient: Jared Watkins   DOB: 29-Jul-1976   44 y.o. Male  MRN: [id number] Visit Date: 04/22/2020  Today's healthcare provider: Trinna Post, PA-C   Chief Complaint  Patient presents with  . Sinus Problem   Subjective    Sinus Problem This is a new problem. The current episode started 1 to 4 weeks ago. The problem is unchanged. There has been no fever. His pain is at a severity of 4/10. The pain is mild. Associated symptoms include congestion, coughing, shortness of breath, sinus pressure and a sore throat. Pertinent negatives include no chills, ear pain, headaches, hoarse voice or sneezing. Past treatments include acetaminophen and oral decongestants. The treatment provided mild relief.    Patient reports continuous congestion, not sleeping well at night. He is having to use allergy pills lately and is taking one every day. He reports eye discharge in the morning. Reports he now weighs 374 lbs which is a significant change for him. He is vaccinated against COVID. He has used wife's albuterol inhaler and it did help as patient reported wheezing.   Morbid Obesity: Reports he currently weighs 75 lbs   Wt Readings from Last  3 Encounters:  05/12/19 (!) 344 lb (156 kg)  10/05/18 (!) 320 lb (145.2 kg)  05/13/18 (!) 314 lb 3.2 oz (142.5 kg)   Sleep Apnea  Patient reports he had sleep study done remotely and had mild sleep apnea. He did not need a CPAP at that time. He is reporting worsening sleep issues including snoring and day time fatigue.      Medications: Outpatient Medications Prior to Visit  Medication Sig  . allopurinol (ZYLOPRIM) 100 MG tablet TAKE 1 TABLET BY MOUTH DAILY  . amLODipine (NORVASC) 10 MG tablet TAKE 1 TABLET BY MOUTH EVERY DAY  . aspirin 81 MG EC tablet Take 81 mg by mouth every other day.   . colchicine (COLCRYS) 0.6 MG tablet TAKE 2 TABLETS BY MOUTH TODAY, THEN 1 TABLET DAILY UNTIL SYMPTOMS IMPROVED  . escitalopram (LEXAPRO) 20 MG tablet TAKE 1 TABLET BY MOUTH DAILY  . fluticasone (FLONASE) 50 MCG/ACT nasal spray Place 2 sprays into both nostrils daily.  Marland Kitchen lisinopril-hydrochlorothiazide (ZESTORETIC) 10-12.5 MG tablet TAKE 1 TABLET BY MOUTH DAILY  . LORazepam (ATIVAN) 0.5 MG tablet TAKE 1 TABLET BY MOUTH TWICE DAILY AS NEEDED FOR ANXIETY  . metoprolol succinate (TOPROL-XL) 25 MG 24 hr tablet Take 0.5 tablets (12.5 mg total) by mouth daily.  Marland Kitchen omeprazole (PRILOSEC) 20 MG capsule Take 1 capsule (20 mg total) by mouth daily.  Marland Kitchen rOPINIRole (REQUIP) 0.5 MG tablet 1/2 tablet at bedtime for 2 days, then one tablet at bedtime for 5 days, then two tablets at bedtime for restless legs.   No facility-administered  medications prior to visit.    Review of Systems  Constitutional: Positive for fatigue. Negative for chills and fever.  HENT: Positive for congestion, sinus pressure, sinus pain and sore throat. Negative for ear pain, hoarse voice, postnasal drip, rhinorrhea and sneezing.   Eyes: Positive for discharge and itching.  Respiratory: Positive for cough, shortness of breath and wheezing.   Neurological: Negative for headaches.      Objective    There were no vitals taken for this  visit.   Physical Exam Constitutional:      Appearance: Normal appearance.  Pulmonary:     Effort: Pulmonary effort is normal. No respiratory distress.  Neurological:     Mental Status: He is alert.  Psychiatric:        Mood and Affect: Mood normal.        Behavior: Behavior normal.        Assessment & Plan    1. Bronchitis  Treat as below. Patient declines COVID testing.  - albuterol (VENTOLIN HFA) 108 (90 Base) MCG/ACT inhaler; Inhale 2 puffs into the lungs every 6 (six) hours as needed for wheezing or shortness of breath.  Dispense: 1 each; Refill: 2  2. Nasal congestion  Continue 2nd gen antihistamine daily and add flonase.   3. 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 Discussed branded and off label weight loss medicines, start as below and follow up in one month with PCP.   - topiramate (TOPAMAX) 50 MG tablet; 1/2 tablet every night for 14 days, then increase to 1 tablet every night for 14 days, then 2 tablets every night  Dispense: 60 tablet; Refill: 0  4. Obstructive sleep apnea  Order home sleep study to reassess need for CPAP in context of weight gain.    I discussed the assessment and treatment plan with the patient. The patient was provided an opportunity to ask questions and all were answered. The patient agreed with the plan and demonstrated an understanding of the instructions.   The patient was advised to call back or seek an in-person evaluation if the symptoms worsen or if the condition fails to improve as anticipated.   ITrinna Post, PA-C, have reviewed all documentation for this visit. The documentation on 04/22/20 for the exam, diagnosis, procedures, and orders are all accurate and complete.   Paulene Floor [facility] [phone] (phone) [phone] (fax)  [place]

## 2020-04-22 NOTE — Patient Instructions (Signed)

## 2020-04-22 NOTE — Telephone Encounter (Signed)
Copied from [place] [id number]. Topic: General - Other >> Apr 22, 2020 11:15 AM Celene Kras wrote: Reason for CRM: Pt returning call to be screened for appt. Pt states that he does not have access to his mychart. Please advise.

## 2020-04-24 MED ORDER — TOPIRAMATE 50 MG PO TABS: ORAL_TABLET | ORAL | 0 refills | Status: DC

## 2020-05-21 NOTE — Progress Notes (Deleted)
° ° ° °  Established patient visit   Patient: Jared Watkins   DOB: August 23, 1975   44 y.o. Male  MRN: [id number] Visit Date: 05/22/2020  Today's healthcare provider: Lelon Huh, MD   No chief complaint on file.  Subjective    HPI  Follow up for Bronchitis:  The patient was last seen for this 1 months ago. Changes made at last visit include ***.  He reports {excellent/good/fair/poor:19665} compliance with treatment. He feels that condition is {improved/worse/unchanged:3041574}. He {is/is not:21021397} having side effects. ***  ----------------------------------------------------------------------------------------- Follow up for Obesity:  The patient was last seen for this 1 months ago. Changes made at last visit include started Topamax 50 mg.  He reports {excellent/good/fair/poor:19665} compliance with treatment. He feels that condition is {improved/worse/unchanged:3041574}. He {is/is not:21021397} having side effects. ***  -----------------------------------------------------------------------------------------     {Show patient history (optional):23778::" "}   Medications: Outpatient Medications Prior to Visit  Medication Sig   albuterol (VENTOLIN HFA) 108 (90 Base) MCG/ACT inhaler Inhale 2 puffs into the lungs every 6 (six) hours as needed for wheezing or shortness of breath.   allopurinol (ZYLOPRIM) 100 MG tablet TAKE 1 TABLET BY MOUTH DAILY   amLODipine (NORVASC) 10 MG tablet TAKE 1 TABLET BY MOUTH EVERY DAY   aspirin 81 MG EC tablet Take 81 mg by mouth every other day.    colchicine (COLCRYS) 0.6 MG tablet TAKE 2 TABLETS BY MOUTH TODAY, THEN 1 TABLET DAILY UNTIL SYMPTOMS IMPROVED   escitalopram (LEXAPRO) 20 MG tablet TAKE 1 TABLET BY MOUTH DAILY   fluticasone (FLONASE) 50 MCG/ACT nasal spray Place 2 sprays into both nostrils daily.   lisinopril-hydrochlorothiazide (ZESTORETIC) 10-12.5 MG tablet TAKE 1 TABLET BY MOUTH DAILY   LORazepam (ATIVAN) 0.5 MG  tablet TAKE 1 TABLET BY MOUTH TWICE DAILY AS NEEDED FOR ANXIETY   metoprolol succinate (TOPROL-XL) 25 MG 24 hr tablet Take 0.5 tablets (12.5 mg total) by mouth daily.   omeprazole (PRILOSEC) 20 MG capsule Take 1 capsule (20 mg total) by mouth daily.   rOPINIRole (REQUIP) 0.5 MG tablet 1/2 tablet at bedtime for 2 days, then one tablet at bedtime for 5 days, then two tablets at bedtime for restless legs.   topiramate (TOPAMAX) 50 MG tablet 1/2 tablet every night for 14 days, then increase to 1 tablet every night for 14 days, then 2 tablets every night   No facility-administered medications prior to visit.    Review of Systems  Constitutional: Negative.   Respiratory: Negative.   Cardiovascular: Negative.   Musculoskeletal: Negative.     {Heme   Chem   Endocrine   Serology   Results Review (optional):23779::" "}  Objective    There were no vitals taken for this visit. {Show previous vital signs (optional):23777::" "}  Physical Exam  ***  No results found for any visits on 05/22/20.  Assessment & Plan     ***  No follow-ups on file.      {provider attestation***:1}   Lelon Huh, MD  [facility] [phone] (phone) [phone] (fax)  [place]

## 2020-05-22 ENCOUNTER — Ambulatory Visit: Payer: Self-pay | Admitting: Family Medicine

## 2020-05-29 ENCOUNTER — Other Ambulatory Visit: Payer: Self-pay

## 2020-05-29 ENCOUNTER — Encounter: Payer: Self-pay | Admitting: Family Medicine

## 2020-05-29 ENCOUNTER — Ambulatory Visit (INDEPENDENT_AMBULATORY_CARE_PROVIDER_SITE_OTHER): Payer: Managed Care, Other (non HMO) | Admitting: Family Medicine

## 2020-05-29 DIAGNOSIS — R7401 Elevation of levels of liver transaminase levels: Secondary | ICD-10-CM

## 2020-05-29 DIAGNOSIS — E785 Hyperlipidemia, unspecified: Secondary | ICD-10-CM | POA: Diagnosis not present

## 2020-05-29 DIAGNOSIS — I10 Essential (primary) hypertension: Secondary | ICD-10-CM

## 2020-05-29 DIAGNOSIS — R739 Hyperglycemia, unspecified: Secondary | ICD-10-CM

## 2020-05-29 DIAGNOSIS — F419 Anxiety disorder, unspecified: Secondary | ICD-10-CM

## 2020-05-29 DIAGNOSIS — Z23 Encounter for immunization: Secondary | ICD-10-CM | POA: Diagnosis not present

## 2020-05-29 DIAGNOSIS — K760 Fatty (change of) liver, not elsewhere classified: Secondary | ICD-10-CM | POA: Diagnosis not present

## 2020-05-29 NOTE — Patient Instructions (Addendum)
•   Please review the attached list of medications and notify my office if there are any errors.    You need to engage in moderate exercise such as brisk walking for an average of 30 minutes per day. Reduce your calorie intake by eating smaller meals and not getting second helpings. Avoid high glycemic index foods with a goal of losing 2-3 pounds per week to achieve and maintain a weight below 350 pounds within the next 3 months.

## 2020-05-29 NOTE — Progress Notes (Signed)
Established patient visit   Patient: Jared Watkins   DOB: Nov 29, 1975   44 y.o. Male  MRN: [id number] Visit Date: 05/29/2020  Today's healthcare provider: Lelon Huh, MD   Chief Complaint  Patient presents with  . Hyperlipidemia  . Hypertension  . Obesity   Subjective    HPI  Follow up for Obesity:  The patient was last seen for this 04/22/2020 (seen by Carles Collet, PA-C). Changes made at last visit include starting Topimax 50 MG tablet; 1/2 tablet every night for 14 days, then increase to 1 tablet every night for 14 days, then 2 tablets every night .  He reports good compliance with treatment. Patient has titrated up to 1 tablet daily. He feels that condition is Unchanged. He is not having side effects.   He states he does not consume sweet foods or drinks. He walks at work all day long, but is exhausted when he gets home in the evening and doesn't do other exercise. He states he eats very little throughout the day and doesn't usually eat large dinners.  He has alternative goals form that he needs completely for employer sponsored health insurance.  ----------------------------------------------------------------------------------------- He also has history of anxiety and continues on escitalopram and feels this is much improved. He rarely takes lorazepam.   He is also due for follow up hypertension and is doing on well on current medications. Not checking home BP regularly. No chest pain, no heart flutters. He odes get short of breath when walking long distances.      Medications: Outpatient Medications Prior to Visit  Medication Sig  . albuterol (VENTOLIN HFA) 108 (90 Base) MCG/ACT inhaler Inhale 2 puffs into the lungs every 6 (six) hours as needed for wheezing or shortness of breath.  . allopurinol (ZYLOPRIM) 100 MG tablet TAKE 1 TABLET BY MOUTH DAILY  . amLODipine (NORVASC) 10 MG tablet TAKE 1 TABLET BY MOUTH EVERY DAY  . aspirin 81 MG EC tablet Take 81 mg by  mouth every other day.   . colchicine (COLCRYS) 0.6 MG tablet TAKE 2 TABLETS BY MOUTH TODAY, THEN 1 TABLET DAILY UNTIL SYMPTOMS IMPROVED  . escitalopram (LEXAPRO) 20 MG tablet TAKE 1 TABLET BY MOUTH DAILY  . fluticasone (FLONASE) 50 MCG/ACT nasal spray Place 2 sprays into both nostrils daily.  Marland Kitchen lisinopril-hydrochlorothiazide (ZESTORETIC) 10-12.5 MG tablet TAKE 1 TABLET BY MOUTH DAILY  . LORazepam (ATIVAN) 0.5 MG tablet TAKE 1 TABLET BY MOUTH TWICE DAILY AS NEEDED FOR ANXIETY  . metoprolol succinate (TOPROL-XL) 25 MG 24 hr tablet Take 0.5 tablets (12.5 mg total) by mouth daily.  Marland Kitchen omeprazole (PRILOSEC) 20 MG capsule Take 1 capsule (20 mg total) by mouth daily.  Marland Kitchen rOPINIRole (REQUIP) 0.5 MG tablet 1/2 tablet at bedtime for 2 days, then one tablet at bedtime for 5 days, then two tablets at bedtime for restless legs.  . topiramate (TOPAMAX) 50 MG tablet 1/2 tablet every night for 14 days, then increase to 1 tablet every night for 14 days, then 2 tablets every night   No facility-administered medications prior to visit.    Review of Systems  Constitutional: Negative for appetite change, chills and fever.  Respiratory: Negative for chest tightness, shortness of breath and wheezing.   Cardiovascular: Negative for chest pain and palpitations.  Gastrointestinal: Negative for abdominal pain, nausea and vomiting.      Objective    BP 125/90 (BP Location: Right Arm, Patient Position: Sitting, Cuff Size: Large)   Pulse 85  Temp 98.5 F (36.9 C) (Oral)   Resp (!) 24   Ht 6' (1.829 m)   Wt (!) 375 lb 12.8 oz (170.5 kg)   BMI 50.97 kg/m    Physical Exam    General: Appearance:    Severely obese male in no acute distress  Eyes:    PERRL, conjunctiva/corneas clear, EOM's intact       Lungs:     Clear to auscultation bilaterally, respirations unlabored  Heart:    Normal heart rate. Normal rhythm. No murmurs, rubs, or gallops.   MS:   All extremities are intact.   Neurologic:   Awake, alert,  oriented x 3. No apparent focal neurological           defect.         Assessment & Plan     1. Severe obesity (BMI >= 40) (HCC) Recently started on topiramate, probably not a good candidate for phentermine due to amphetamine abuse history. Consider naltrexone, bupropion and a GLP1 agonist. He states he has tried [place] in the past and doesn't think it helped.  Alternative BMI goal form completed as per patient instructions.  - TSH  2. Primary hypertension Well controlled.  Continue current medications.   - Comprehensive metabolic panel  3. Fatty liver Checking labs today.   4. Hyperlipidemia, unspecified hyperlipidemia type  - CBC - Lipid panel  5. Elevated transaminase level  - CBC - Comprehensive metabolic panel  6. Hyperglycemia  - Hemoglobin A1c  7. Anxiety Doing well with escitalopram and rare use of lorazepam.   8. Need for influenza vaccination  - Flu Vaccine QUAD 36+ mos IM (Fluarix/Fluzone)         The entirety of the information documented in the History of Present Illness, Review of Systems and Physical Exam were personally obtained by me. Portions of this information were initially documented by the CMA and reviewed by me for thoroughness and accuracy.      Lelon Huh, MD  [facility] [phone] (phone) [phone] (fax)  [place]

## 2020-05-30 LAB — COMPREHENSIVE METABOLIC PANEL
ALT: 71 IU/L — ABNORMAL HIGH (ref 0–44)
AST: 61 IU/L — ABNORMAL HIGH (ref 0–40)
Albumin/Globulin Ratio: 1.8 (ref 1.2–2.2)
Albumin: 4.5 g/dL (ref 4.0–5.0)
Alkaline Phosphatase: 121 IU/L (ref 44–121)
BUN/Creatinine Ratio: 23 — ABNORMAL HIGH (ref 9–20)
BUN: 22 mg/dL (ref 6–24)
Bilirubin Total: 0.5 mg/dL (ref 0.0–1.2)
CO2: 23 mmol/L (ref 20–29)
Calcium: 9.3 mg/dL (ref 8.7–10.2)
Chloride: 102 mmol/L (ref 96–106)
Creatinine, Ser: 0.94 mg/dL (ref 0.76–1.27)
GFR calc Af Amer: 114 mL/min/{1.73_m2} (ref >59)
GFR calc non Af Amer: 98 mL/min/{1.73_m2} (ref >59)
Globulin, Total: 2.5 g/dL (ref 1.5–4.5)
Glucose: 108 mg/dL — ABNORMAL HIGH (ref 65–99)
Potassium: 4.3 mmol/L (ref 3.5–5.2)
Sodium: 139 mmol/L (ref 134–144)
Total Protein: 7 g/dL (ref 6.0–8.5)

## 2020-05-30 LAB — CBC
Hematocrit: 43.5 % (ref 37.5–51.0)
Hemoglobin: 14.4 g/dL (ref 13.0–17.7)
MCH: 29.9 pg (ref 26.6–33.0)
MCHC: 33.1 g/dL (ref 31.5–35.7)
MCV: 90 fL (ref 79–97)
Platelets: 239 10*3/uL (ref 150–450)
RBC: 4.81 x10E6/uL (ref 4.14–5.80)
RDW: 12.8 % (ref 11.6–15.4)
WBC: 7.9 10*3/uL (ref 3.4–10.8)

## 2020-05-30 LAB — LIPID PANEL
Chol/HDL Ratio: 4.3 ratio (ref 0.0–5.0)
Cholesterol, Total: 211 mg/dL — ABNORMAL HIGH (ref 100–199)
HDL: 49 mg/dL (ref >39)
LDL Chol Calc (NIH): 135 mg/dL — ABNORMAL HIGH (ref 0–99)
Triglycerides: 149 mg/dL (ref 0–149)
VLDL Cholesterol Cal: 27 mg/dL (ref 5–40)

## 2020-05-30 LAB — HEMOGLOBIN A1C
Est. average glucose Bld gHb Est-mCnc: 128 mg/dL
Hgb A1c MFr Bld: 6.1 % — ABNORMAL HIGH (ref 4.8–5.6)

## 2020-05-30 LAB — TSH: TSH: 1.19 u[IU]/mL (ref 0.450–4.500)

## 2020-05-31 ENCOUNTER — Other Ambulatory Visit: Payer: Self-pay | Admitting: Family Medicine

## 2020-05-31 MED ORDER — SEMAGLUTIDE-WEIGHT MANAGEMENT 0.5 MG/0.5ML ~~LOC~~ SOAJ: 0.5000 mg | SUBCUTANEOUS | 0 refills | Status: DC

## 2020-05-31 MED ORDER — SEMAGLUTIDE-WEIGHT MANAGEMENT 0.25 MG/0.5ML ~~LOC~~ SOAJ: 0.2500 mg | SUBCUTANEOUS | 0 refills | Status: AC

## 2020-06-03 ENCOUNTER — Ambulatory Visit: Payer: Managed Care, Other (non HMO) | Admitting: Family Medicine

## 2020-06-28 ENCOUNTER — Other Ambulatory Visit: Payer: Self-pay | Admitting: Family Medicine

## 2020-06-28 DIAGNOSIS — K219 Gastro-esophageal reflux disease without esophagitis: Secondary | ICD-10-CM

## 2020-08-05 ENCOUNTER — Other Ambulatory Visit: Payer: Self-pay | Admitting: Family Medicine

## 2020-08-05 DIAGNOSIS — I10 Essential (primary) hypertension: Secondary | ICD-10-CM

## 2020-08-05 NOTE — Telephone Encounter (Signed)
Requested Prescriptions  Pending Prescriptions Disp Refills  . lisinopril-hydrochlorothiazide (ZESTORETIC) 10-12.5 MG tablet [Pharmacy Med Name: LISINOPRIL-HCTZ 10/12.5MG  TABLETS] 90 tablet 1    Sig: TAKE 1 TABLET BY MOUTH DAILY     Cardiovascular:  ACEI + Diuretic Combos Failed - 08/05/2020  8:29 AM      Failed - Last BP in normal range    BP Readings from Last 1 Encounters:  05/29/20 125/90         Passed - Na in normal range and within 180 days    Sodium  Date Value Ref Range Status  05/29/2020 139 134 - 144 mmol/L Final         Passed - K in normal range and within 180 days    Potassium  Date Value Ref Range Status  05/29/2020 4.3 3.5 - 5.2 mmol/L Final         Passed - Cr in normal range and within 180 days    Creat  Date Value Ref Range Status  01/05/2014 0.93 0.50 - 1.35 mg/dL Final   Creatinine, Ser  Date Value Ref Range Status  05/29/2020 0.94 0.76 - 1.27 mg/dL Final   Creatinine,U  Date Value Ref Range Status  08/24/2012 90.2 mg/dL Final         Passed - Ca in normal range and within 180 days    Calcium  Date Value Ref Range Status  05/29/2020 9.3 8.7 - 10.2 mg/dL Final         Passed - Patient is not pregnant      Passed - Valid encounter within last 6 months    Recent Outpatient Visits          2 months ago Severe obesity (BMI >= 40) (HCC)   [facility] Malva Limes, MD   3 months ago Bronchitis   [facility] [place], [place], [place]   1 year ago Annual physical exam   [facility] Malva Limes, MD   2 years ago Severe obesity (BMI >= 40) [facility])   [facility] Malva Limes, MD   2 years ago Diarrhea, unspecified type   [facility] Malva Limes, MD

## 2020-08-13 ENCOUNTER — Telehealth: Payer: Self-pay | Admitting: Family Medicine

## 2020-08-13 DIAGNOSIS — I10 Essential (primary) hypertension: Secondary | ICD-10-CM

## 2020-08-14 NOTE — Telephone Encounter (Signed)
Per last labs from 05/2019 patient was supposed to follow up in 2 months after starting new weight loss medication Wegovi. I called patient about this. He states never picked up samples from our office. Patient plans to pick them up this week. Patient is completely out of Amlodipine so I refilled that prescription. I checked to see if samples of Theresa Duty was still in the refrigerator for patient. There are 2 samples in the refrigerator, but they're not labeled with patients name. Do you know if those were his samples? Should I put them aside for patient?

## 2020-08-15 NOTE — Telephone Encounter (Signed)
He can have one sample of [facility]

## 2020-08-16 NOTE — Telephone Encounter (Signed)
Patient called to inform the office that his son, son barkan will be coming by the office to pick up the sample of medication for him.

## 2020-08-27 ENCOUNTER — Encounter: Payer: Self-pay | Admitting: Adult Health

## 2020-08-27 ENCOUNTER — Telehealth (INDEPENDENT_AMBULATORY_CARE_PROVIDER_SITE_OTHER): Payer: Managed Care, Other (non HMO) | Admitting: Adult Health

## 2020-08-27 DIAGNOSIS — Z87898 Personal history of other specified conditions: Secondary | ICD-10-CM | POA: Insufficient documentation

## 2020-08-27 DIAGNOSIS — Z8619 Personal history of other infectious and parasitic diseases: Secondary | ICD-10-CM

## 2020-08-27 DIAGNOSIS — K219 Gastro-esophageal reflux disease without esophagitis: Secondary | ICD-10-CM

## 2020-08-27 DIAGNOSIS — K5792 Diverticulitis of intestine, part unspecified, without perforation or abscess without bleeding: Secondary | ICD-10-CM | POA: Diagnosis not present

## 2020-08-27 DIAGNOSIS — R197 Diarrhea, unspecified: Secondary | ICD-10-CM | POA: Diagnosis not present

## 2020-08-27 DIAGNOSIS — Z8719 Personal history of other diseases of the digestive system: Secondary | ICD-10-CM | POA: Insufficient documentation

## 2020-08-27 MED ORDER — OMEPRAZOLE 20 MG PO CPDR: 40.0000 mg | DELAYED_RELEASE_CAPSULE | Freq: Every day | ORAL | 0 refills | Status: DC

## 2020-08-27 MED ORDER — AMOXICILLIN-POT CLAVULANATE 875-125 MG PO TABS: 1.0000 | ORAL_TABLET | Freq: Two times a day (BID) | ORAL | 0 refills | Status: DC

## 2020-08-27 NOTE — Progress Notes (Addendum)
Virtual Visit via Video Note  I connected with Jared Watkins on 08/27/20 at 10:00 AM EST by a video enabled telemedicine application and verified that I am speaking with the correct person using two identifiers. Parties involved in visit as below:   Location: Patient: at home  Provider: Provider: Provider's office at  [facility], [place].      I discussed the limitations of evaluation and management by telemedicine and the availability of in person appointments. The patient expressed understanding and agreed to proceed.  History of Present Illness: Patient with 5 days of diarrhea, has had two episodes of vomitting, He reports he had some left over Augmentin and he took when initially started with diarrhea and generalized stomach cramping    He denies seeing any blood, or mucous in stools. He denies any hemoptysis. He denies any severe stomach pain, rates as mild. He has nausea intermittent. Denies any back or chest pain.   He does not have a gastrointestinal MD. He has been referred in the past.   He would like to be referred now.   Denies any ill exposures.  Declined covid testing.   Patient  denies any fever, body aches,chills, rash, chest pain, shortness of breath, or diarrhea.  Denies dizziness, lightheadedness, pre syncopal or syncopal episodes.   Observations/Objective:    Patient is alert and oriented and responsive to questions Engages in conversation with provider. Speaks in full sentences without any pauses without any shortness of breath or distress.   Assessment and Plan:  Diverticulitis - Plan: amoxicillin-clavulanate (AUGMENTIN) 875-125 MG tablet, omeprazole (PRILOSEC) 20 MG capsule, Ambulatory referral to Gastroenterology  Gastroesophageal reflux disease - Plan: omeprazole (PRILOSEC) 20 MG capsule, Ambulatory referral to Gastroenterology  Diarrhea of presumed infectious origin - Plan: Ambulatory referral to Gastroenterology  History of  vomiting - Plan: Ambulatory referral to Gastroenterology  History of Helicobacter pylori infection - Plan: Ambulatory referral to Gastroenterology   Meds ordered this encounter  Medications  . amoxicillin-clavulanate (AUGMENTIN) 875-125 MG tablet    Sig: Take 1 tablet by mouth 2 (two) times daily.    Dispense:  20 tablet    Refill:  0  . omeprazole (PRILOSEC) 20 MG capsule    Sig: Take 2 capsules (40 mg total) by mouth daily for 21 days.    Dispense:  42 capsule    Refill:  0   Follow Up Instructions:   . Orders Placed This Encounter  Procedures  . Ambulatory referral to Gastroenterology    Referral Priority:   Urgent    Referral Type:   Consultation    Referral Reason:   Specialty Services Required    Number of Visits Requested:   1   Should hear from GI within one week.  Red Flags discussed. The patient was given clear instructions to go to ER or return to medical center if any red flags develop, symptoms do not improve, worsen or new problems develop. They verbalized understanding.  I discussed the assessment and treatment plan with the patient. The patient was provided an opportunity to ask questions and all were answered. The patient agreed with the plan and demonstrated an understanding of the instructions.   The patient was advised to call back or seek an in-person evaluation if the symptoms worsen or if the condition fails to improve as anticipated.    I discussed the limitations of evaluation and management by telemedicine and the availability of in person appointments. The patient expressed understanding and agreed to  proceed. I provided 30  minutes of non-face-to-face time during this encounter.   Marcille Buffy, FNP

## 2020-09-17 ENCOUNTER — Encounter: Payer: Self-pay | Admitting: Family Medicine

## 2020-09-17 ENCOUNTER — Ambulatory Visit (INDEPENDENT_AMBULATORY_CARE_PROVIDER_SITE_OTHER): Payer: Managed Care, Other (non HMO) | Admitting: Family Medicine

## 2020-09-17 ENCOUNTER — Other Ambulatory Visit: Payer: Self-pay

## 2020-09-17 VITALS — BP 150/102 | HR 99 | Temp 97.6°F | Resp 24 | Ht 72.0 in | Wt 359.0 lb

## 2020-09-17 DIAGNOSIS — F410 Panic disorder [episodic paroxysmal anxiety] without agoraphobia: Secondary | ICD-10-CM | POA: Diagnosis not present

## 2020-09-17 DIAGNOSIS — F419 Anxiety disorder, unspecified: Secondary | ICD-10-CM

## 2020-09-17 DIAGNOSIS — R030 Elevated blood-pressure reading, without diagnosis of hypertension: Secondary | ICD-10-CM

## 2020-09-17 MED ORDER — SEMAGLUTIDE-WEIGHT MANAGEMENT 0.25 MG/0.5ML ~~LOC~~ SOAJ: 0.5000 mg | SUBCUTANEOUS | 0 refills | Status: AC

## 2020-09-17 MED ORDER — SERTRALINE HCL 100 MG PO TABS: 100.0000 mg | ORAL_TABLET | Freq: Every day | ORAL | 3 refills | Status: DC

## 2020-09-17 NOTE — Progress Notes (Signed)
Established patient visit   Patient: Jared Watkins   DOB: [birth year]   45 y.o. Male  MRN: [id number] Visit Date: 09/17/2020  Today's healthcare provider: Lelon Huh, MD   Chief Complaint  Patient presents with  . Anxiety  . Obesity   Subjective    HPI  Anxiety, Follow-up  He was last seen for anxiety 3 months ago. Changes made at last visit include none. It was noted that he was doing well with escitalopram and rare use of lorazepam.   He reports good compliance with treatment. He reports good tolerance of treatment. He is not having side effects.   He feels his anxiety is moderate and Worse since last visit. Patient has been experiencing panic attacks twice a day. Has had more stress which may be due to not working due to weather.   Symptoms: No chest pain Yes difficulty concentrating  No dizziness No fatigue  No feelings of losing control Yes insomnia (trouble falling asleep and staying asleep)  Yes irritable No palpitations  Yes panic attacks Yes racing thoughts  No shortness of breath Yes sweating  No tremors/shakes    GAD-7 Results GAD-7 Generalized Anxiety Disorder Screening Tool 09/17/2020  1. Feeling Nervous, Anxious, or on Edge 2  2. Not Being Able to Stop or Control Worrying 2  3. Worrying Too Much About Different Things 2  4. Trouble Relaxing 2  5. Being So Restless it's Hard To Sit Still 2  6. Becoming Easily Annoyed or Irritable 2  7. Feeling Afraid As If Something Awful Might Happen 0  Total GAD-7 Score 12  Difficulty At Work, Home, or Getting  Along With Others? Somewhat difficult    PHQ-9 Scores PHQ9 SCORE ONLY 09/17/2020 05/29/2020 05/12/2019  PHQ-9 Total Score 8 3 11       Follow up for obesity:  The patient was last seen for this 3 months ago. Changes made at last visit include starting Wegovy injections to help with weight loss (samples given). Patient was advised to follow up in 2 months.   He reports good compliance with  treatment. He feels that condition is Improved. He is not having side effects. Patient has been exercising at the gym daily.   He states Ollen Barges is too expensive and copay coupon ran out.  -----------------------------------------------------------------------------------------    Medications: Outpatient Medications Prior to Visit  Medication Sig  . albuterol (VENTOLIN HFA) 108 (90 Base) MCG/ACT inhaler Inhale 2 puffs into the lungs every 6 (six) hours as needed for wheezing or shortness of breath.  . allopurinol (ZYLOPRIM) 100 MG tablet TAKE 1 TABLET BY MOUTH DAILY  . amLODipine (NORVASC) 10 MG tablet TAKE 1 TABLET BY MOUTH EVERY DAY  . aspirin 81 MG EC tablet Take 81 mg by mouth every other day.  . colchicine (COLCRYS) 0.6 MG tablet TAKE 2 TABLETS BY MOUTH TODAY, THEN 1 TABLET DAILY UNTIL SYMPTOMS IMPROVED  . escitalopram (LEXAPRO) 20 MG tablet TAKE 1 TABLET BY MOUTH DAILY  . fluticasone (FLONASE) 50 MCG/ACT nasal spray Place 2 sprays into both nostrils daily.  Marland Kitchen lisinopril-hydrochlorothiazide (ZESTORETIC) 10-12.5 MG tablet TAKE 1 TABLET BY MOUTH DAILY  . LORazepam (ATIVAN) 0.5 MG tablet TAKE 1 TABLET BY MOUTH TWICE DAILY AS NEEDED FOR ANXIETY  . metoprolol succinate (TOPROL-XL) 25 MG 24 hr tablet Take 0.5 tablets (12.5 mg total) by mouth daily.  Marland Kitchen omeprazole (PRILOSEC) 20 MG capsule Take 2 capsules (40 mg total) by mouth daily for 21 days.  Marland Kitchen rOPINIRole (  REQUIP) 0.5 MG tablet 1/2 tablet at bedtime for 2 days, then one tablet at bedtime for 5 days, then two tablets at bedtime for restless legs.  . Semaglutide-Weight Management 0.5 MG/0.5ML SOAJ Inject 0.5 mg into the skin once a week.  . topiramate (TOPAMAX) 50 MG tablet 1/2 tablet every night for 14 days, then increase to 1 tablet every night for 14 days, then 2 tablets every night  . [DISCONTINUED] amoxicillin-clavulanate (AUGMENTIN) 875-125 MG tablet Take 1 tablet by mouth 2 (two) times daily. (Patient not taking: Reported on 09/17/2020)    No facility-administered medications prior to visit.    Review of Systems  Constitutional: Negative for appetite change, chills and fever.  Respiratory: Negative for chest tightness, shortness of breath and wheezing.   Cardiovascular: Negative for chest pain and palpitations.  Gastrointestinal: Negative for abdominal pain, nausea and vomiting.  Psychiatric/Behavioral: Positive for agitation and sleep disturbance. The patient is nervous/anxious.       Objective    BP (!) 150/102 (BP Location: Left Arm, Patient Position: Sitting)   Pulse 99   Temp 97.6 F (36.4 C) (Temporal)   Resp (!) 24 Comment: labored breathing  Ht 6' (1.829 m)   Wt (!) 359 lb (162.8 kg)   SpO2 94% Comment: room air  BMI 48.69 kg/m     Physical Exam   General appearance: Severely obese male, cooperative and in no acute distress Head: Normocephalic, without obvious abnormality, atraumatic Respiratory: Respirations even and unlabored, normal respiratory rate Extremities: All extremities are intact.  Skin: Skin color, texture, turgor normal. No rashes seen  Psych: Appropriate mood and affect. Neurologic: Mental status: Alert, oriented to person, place, and time, thought content appropriate.     Assessment & Plan     1. Panic attacks D/c escitalopram and start - sertraline (ZOLOFT) 100 MG tablet; Take 1 tablet (100 mg total) by mouth daily. Take in place of escitalopram. Start one tablet daily, may increase to 1 1/2 tablets daily after 1 week, then 2 tablets daily after another week.  Dispense: 30 tablet; Refill: 3  2. Anxiety Change escitalopram to - sertraline (ZOLOFT) 100 MG tablet; Take 1 tablet (100 mg total) by mouth daily. Take in place of escitalopram. Start one tablet daily, may increase to 1 1/2 tablets daily after 1 week, then 2 tablets daily after another week.  Dispense: 30 tablet; Refill: 3  3. Severe obesity (BMI >= 40) ([place]) Doing well on Fyffe, but is too expensive. We did have 4  sample pens of 0.2mg  dose and recommended he go on the Website to apply for discount coupons.  - Semaglutide-Weight Management 0.25 MG/0.5ML SOAJ; Inject 0.5 mg into the skin once a week for 2 doses.  Dispense: 2 mL; Refill: 0  4. Elevated blood pressure reading Likely exacerbated by stress and anxiety.   Follow up 6 weeks.          The entirety of the information documented in the History of Present Illness, Review of Systems and Physical Exam were personally obtained by me. Portions of this information were initially documented by the CMA and reviewed by me for thoroughness and accuracy.      Lelon Huh, MD  [facility] [phone] (phone) [phone] (fax)  [place]

## 2020-09-17 NOTE — Patient Instructions (Addendum)
.   Please review the attached list of medications and notify my office if there are any errors.   . Please bring all of your medications to every appointment so we can make sure that our medication list is the same as yours.    Take sertraline in place of escitalopram. Start one tablet daily, may increase to 1 1/2 tablets daily after 1 week, then 2 tablets daily after another week.

## 2020-10-07 ENCOUNTER — Other Ambulatory Visit: Payer: Self-pay

## 2020-10-07 ENCOUNTER — Ambulatory Visit (INDEPENDENT_AMBULATORY_CARE_PROVIDER_SITE_OTHER): Payer: Managed Care, Other (non HMO) | Admitting: Gastroenterology

## 2020-10-07 ENCOUNTER — Encounter: Payer: Self-pay | Admitting: Gastroenterology

## 2020-10-07 VITALS — BP 145/93 | HR 85 | Temp 97.5°F | Ht 72.0 in | Wt 355.8 lb

## 2020-10-07 DIAGNOSIS — R197 Diarrhea, unspecified: Secondary | ICD-10-CM | POA: Diagnosis not present

## 2020-10-07 DIAGNOSIS — R748 Abnormal levels of other serum enzymes: Secondary | ICD-10-CM

## 2020-10-07 DIAGNOSIS — Z8619 Personal history of other infectious and parasitic diseases: Secondary | ICD-10-CM
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
## 2020-10-08 LAB — HEPATITIS C ANTIBODY: Hep C Virus Ab: <0.1 s/co ratio (ref 0.0–0.9)

## 2020-10-08 LAB — HEPATIC FUNCTION PANEL
ALT: 94 IU/L — ABNORMAL HIGH (ref 0–44)
AST: 73 IU/L — ABNORMAL HIGH (ref 0–40)
Albumin: 5 g/dL (ref 4.0–5.0)
Alkaline Phosphatase: 134 IU/L — ABNORMAL HIGH (ref 44–121)
Bilirubin Total: 0.7 mg/dL (ref 0.0–1.2)
Bilirubin, Direct: 0.21 mg/dL (ref 0.00–0.40)
Total Protein: 7.9 g/dL (ref 6.0–8.5)

## 2020-10-08 LAB — ALPHA-1-ANTITRYPSIN: A-1 Antitrypsin: 95 mg/dL — ABNORMAL LOW (ref 101–187)

## 2020-10-08 LAB — ANTI-SMOOTH MUSCLE ANTIBODY, IGG: Smooth Muscle Ab: 7 Units (ref 0–19)

## 2020-10-08 LAB — HEPATITIS B SURFACE ANTIBODY,QUALITATIVE: Hep B Surface Ab, Qual: REACTIVE

## 2020-10-08 LAB — IRON AND TIBC
Iron Saturation: 33 % (ref 15–55)
Iron: 121 ug/dL (ref 38–169)
Total Iron Binding Capacity: 369 ug/dL (ref 250–450)
UIBC: 248 ug/dL (ref 111–343)

## 2020-10-08 LAB — MITOCHONDRIAL ANTIBODIES: Mitochondrial Ab: <20 Units (ref 0.0–20.0)

## 2020-10-08 LAB — FERRITIN: Ferritin: 446 ng/mL — ABNORMAL HIGH (ref 30–400)

## 2020-10-08 LAB — HEPATITIS B SURFACE ANTIGEN: Hepatitis B Surface Ag: NEGATIVE

## 2020-10-08 LAB — ANA: Anti Nuclear Antibody (ANA): NEGATIVE

## 2020-10-08 LAB — CERULOPLASMIN: Ceruloplasmin: 23.7 mg/dL (ref 16.0–31.0)

## 2020-10-08 LAB — HEPATITIS A ANTIBODY, TOTAL: hep A Total Ab: POSITIVE — AB

## 2020-10-11 IMAGING — CR DG KNEE COMPLETE 4+V*R*
4 series · 4 of 4 positions shown · non-contrast
Comparison: Plain films right knee 07/29/2016.

CLINICAL DATA: Right knee pain and swelling since 09/30/2018. No
known injury.

EXAM:
RIGHT KNEE - COMPLETE 4+ VIEW

[knee ap]
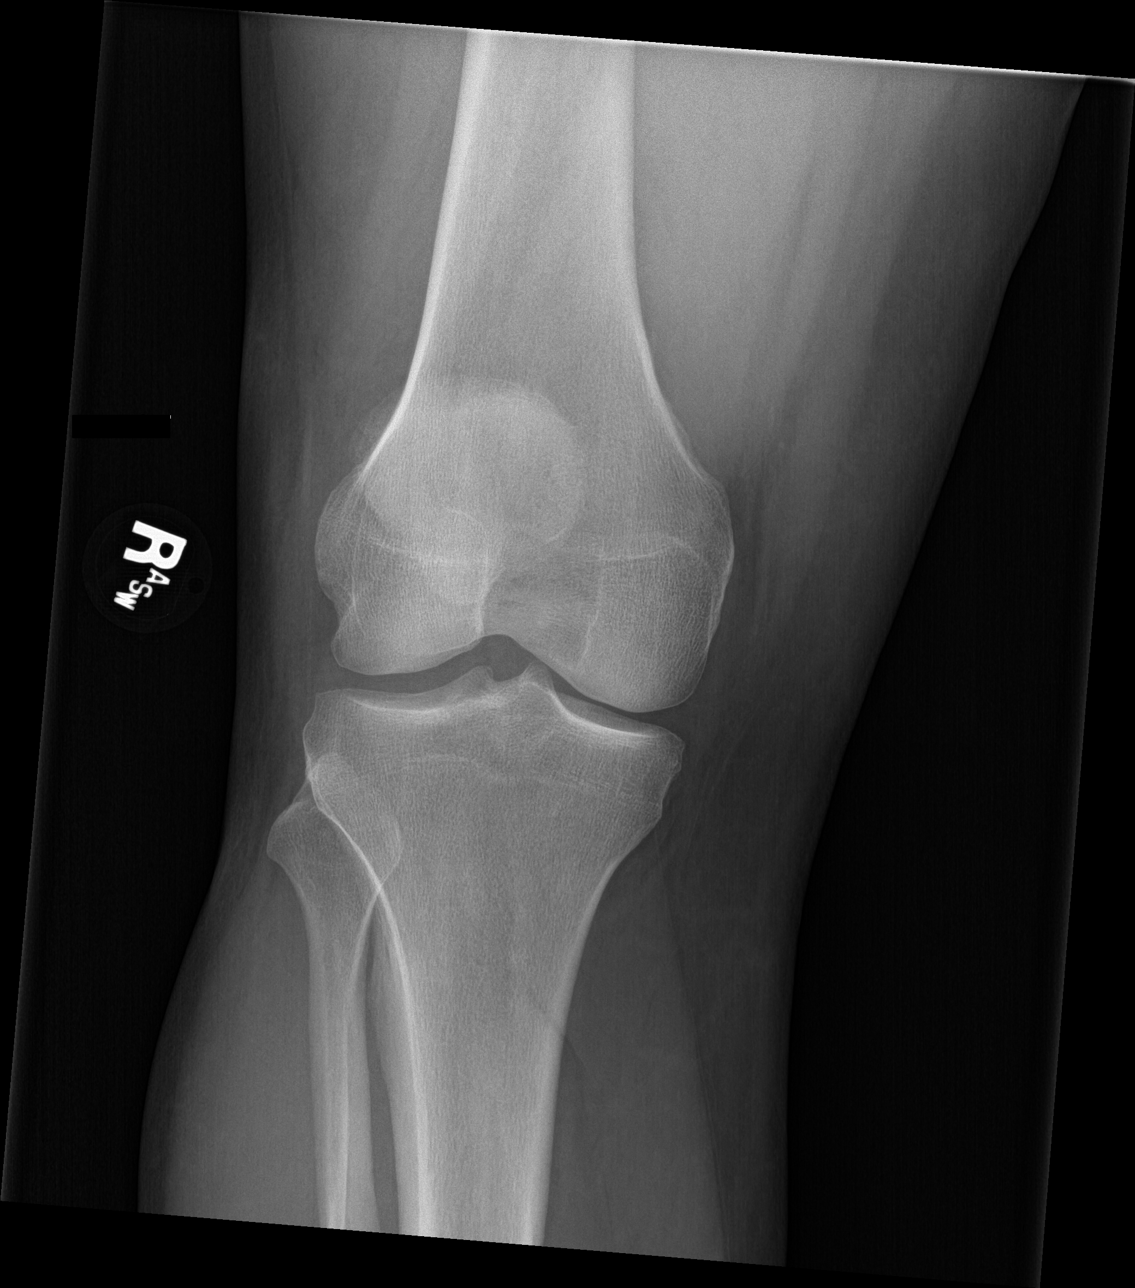

[knee lat]
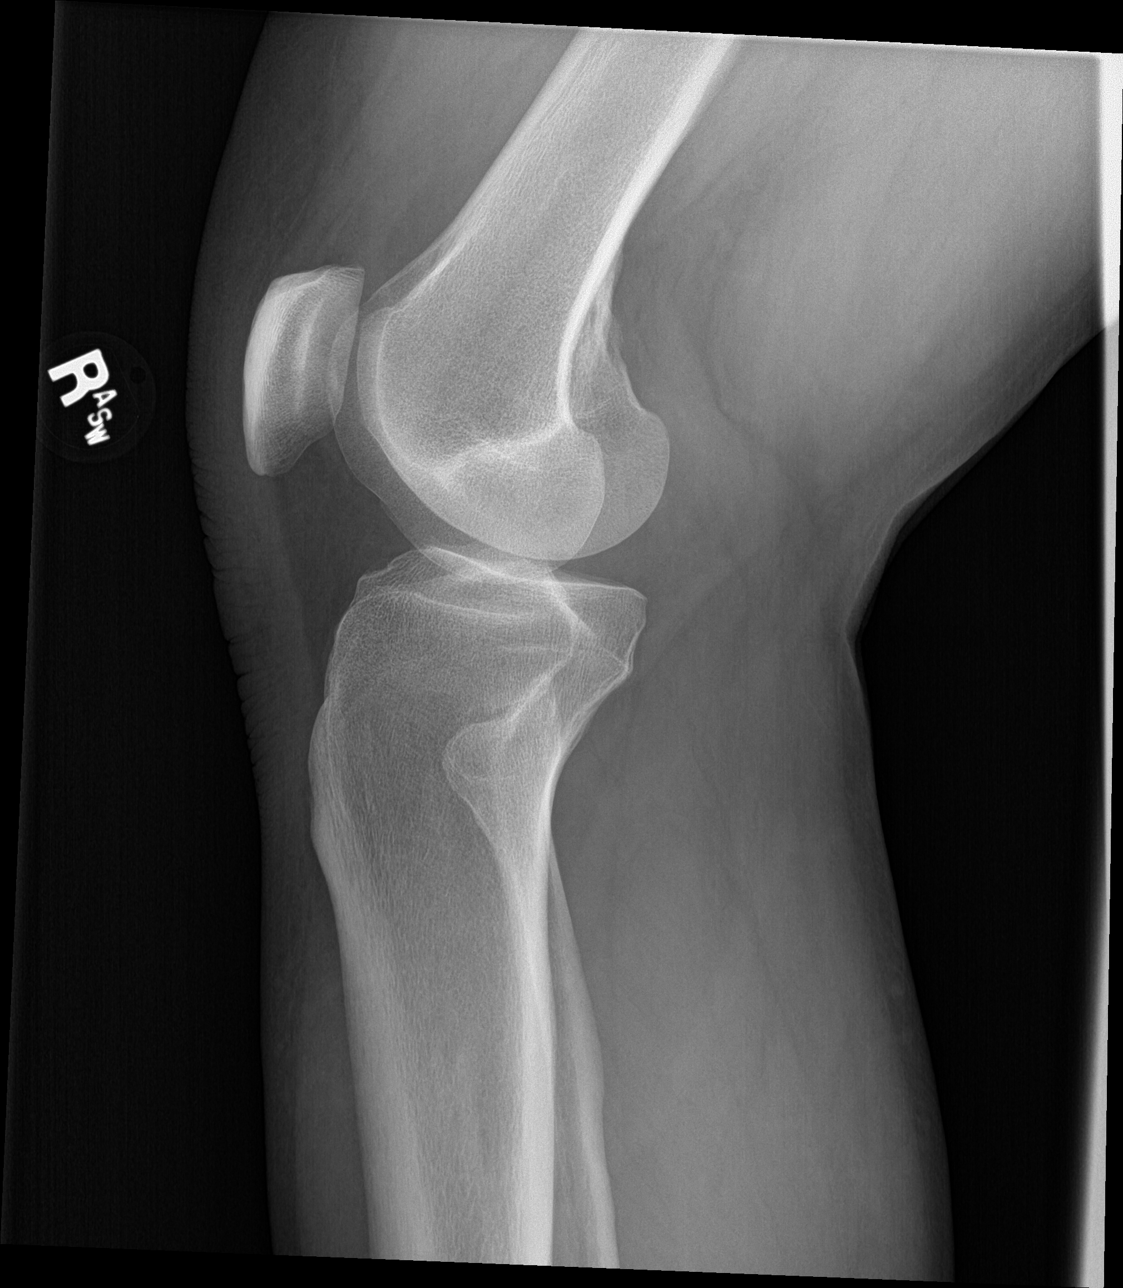

[tunnel]
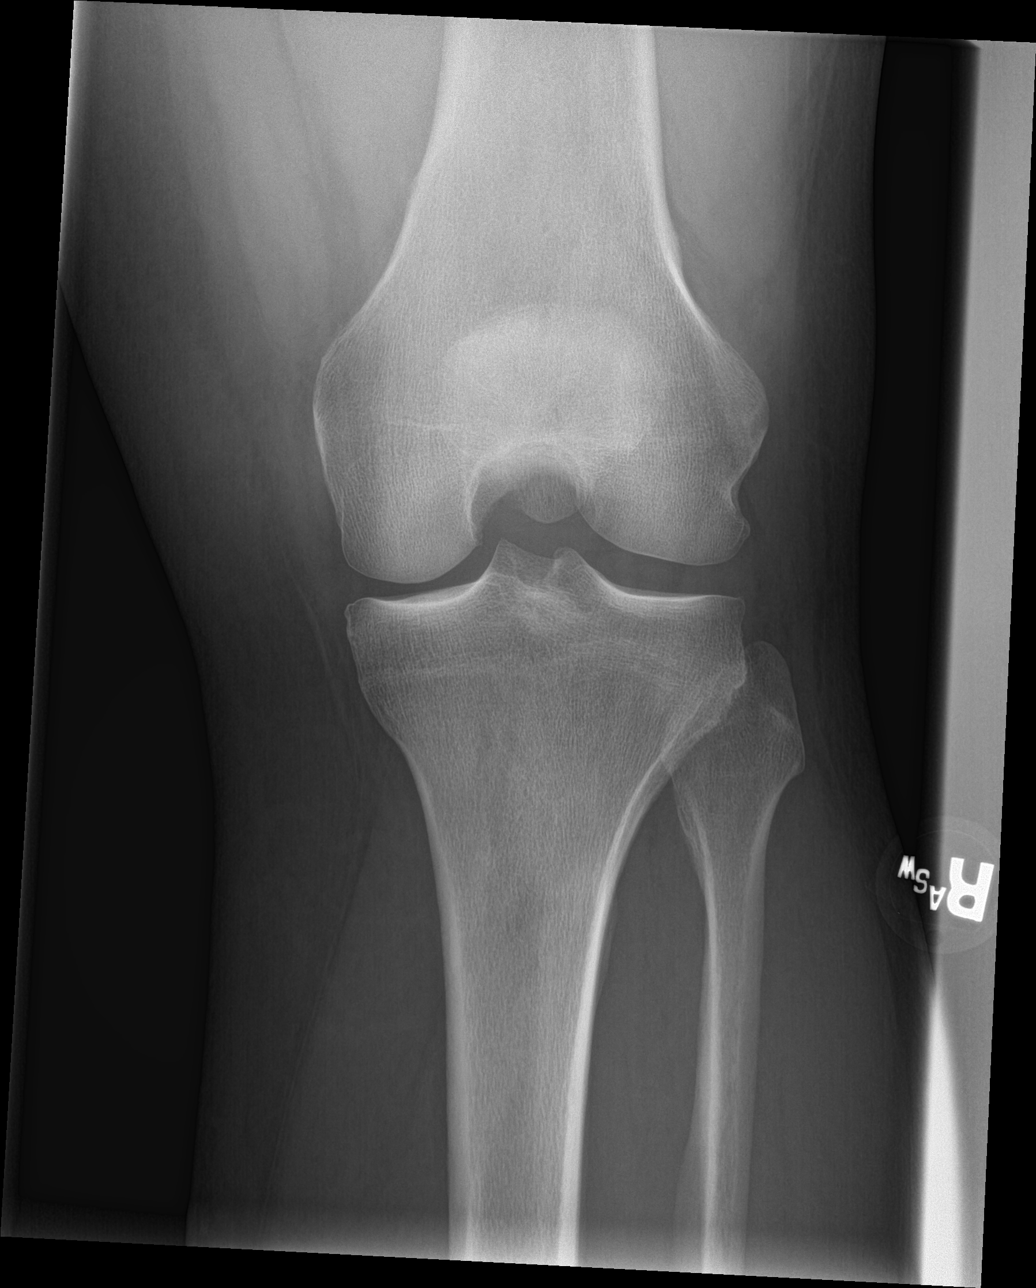

[patella skyline]
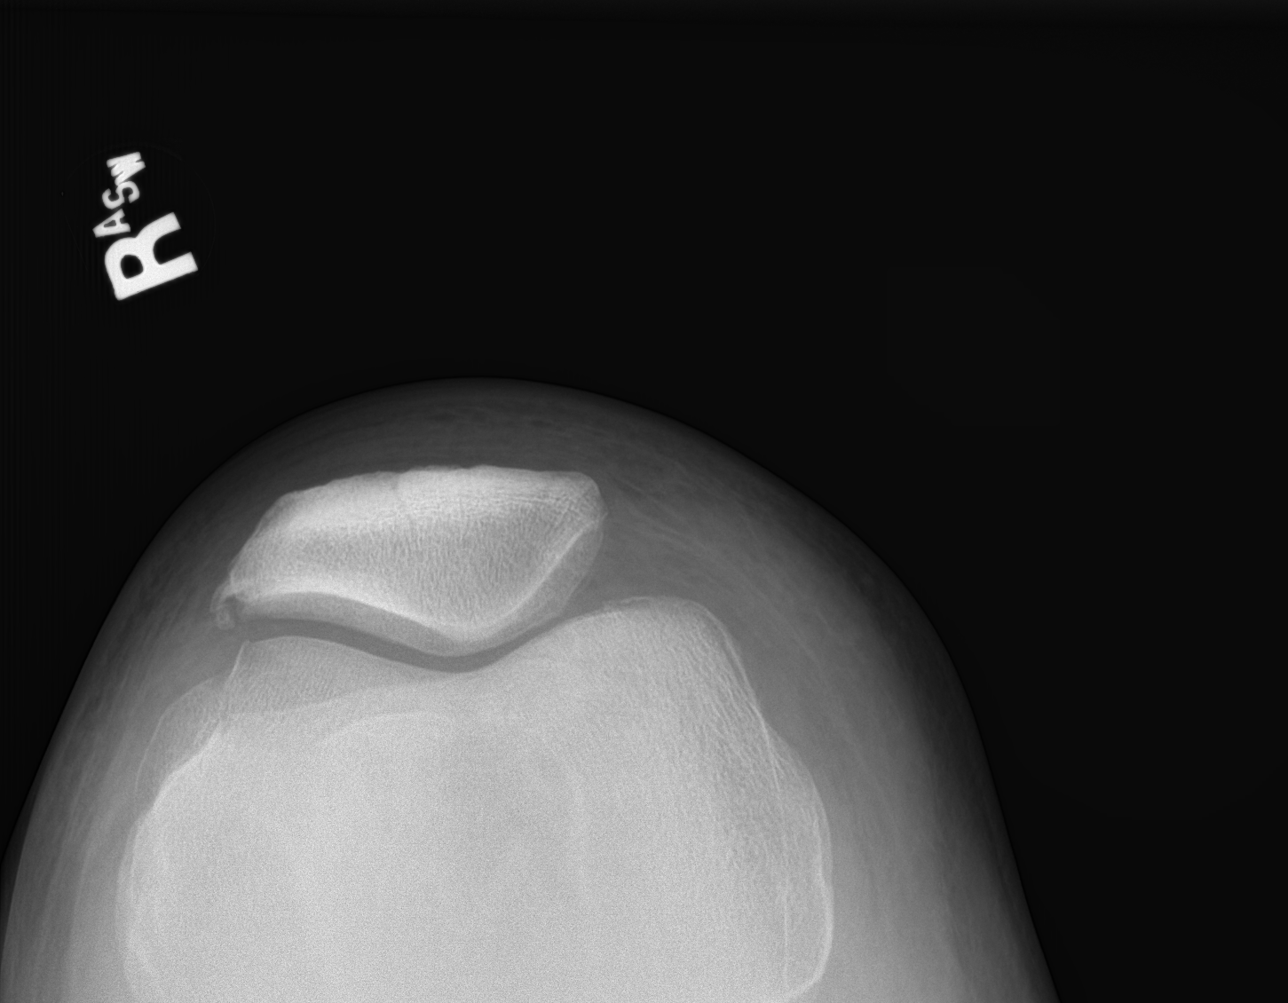

[4 of 4 positions shown; findings below may reference images not displayed]

FINDINGS: No evidence of fracture, dislocation, or joint effusion. No focal
bone abnormality. Mild osteophytosis medial patellar facet noted.
Soft tissues are unremarkable.
IMPRESSION: No acute abnormality.

Minimal patellofemoral degenerative disease.

## 2020-10-16 ENCOUNTER — Ambulatory Visit
Admission: RE | Admit: 2020-10-16 | Discharge: 2020-10-16 | Disposition: A | Discharge status: Home / Self Care | Payer: Managed Care, Other (non HMO) | Source: Ambulatory Visit | Attending: Gastroenterology | Admitting: Gastroenterology

## 2020-10-16 ENCOUNTER — Other Ambulatory Visit: Payer: Self-pay

## 2020-10-16 DIAGNOSIS — R748 Abnormal levels of other serum enzymes: Secondary | ICD-10-CM | POA: Diagnosis present

## 2020-10-28 ENCOUNTER — Ambulatory Visit: Payer: Managed Care, Other (non HMO) | Admitting: Family Medicine

## 2020-10-28 ENCOUNTER — Other Ambulatory Visit: Payer: Self-pay

## 2020-10-28 VITALS — BP 137/97 | HR 71 | Temp 97.7°F | Ht 72.0 in | Wt 355.0 lb

## 2020-10-28 DIAGNOSIS — I10 Essential (primary) hypertension: Secondary | ICD-10-CM

## 2020-10-28 DIAGNOSIS — F419 Anxiety disorder, unspecified: Secondary | ICD-10-CM | POA: Diagnosis not present

## 2020-10-28 DIAGNOSIS — F410 Panic disorder [episodic paroxysmal anxiety] without agoraphobia: Secondary | ICD-10-CM | POA: Diagnosis not present

## 2020-10-28 MED ORDER — WEGOVY 2.4 MG/0.75ML ~~LOC~~ SOAJ: 2.4000 mg | SUBCUTANEOUS | 12 refills | Status: DC

## 2020-10-28 MED ORDER — SERTRALINE HCL 100 MG PO TABS
100.0000 mg | ORAL_TABLET | Freq: Every day | ORAL | 1 refills | Status: DC
Start: 2020-10-28 — End: 2021-04-21

## 2020-10-28 NOTE — Progress Notes (Signed)
Established patient visit   Patient: Jared Watkins   DOB: 03/31/1976   45 y.o. Male  MRN: [id number] Visit Date: 10/28/2020  Today's healthcare provider: Lelon Huh, MD   No chief complaint on file.  Subjective    HPI  Anxiety, Follow-up  He was last seen for anxiety 1 months ago. Changes made at last visit include; D/c escitalopram and start - sertraline (ZOLOFT) 100 MG tablet; Take 1 tablet (100 mg total) by mouth daily. Take in place of escitalopram. Start one tablet daily, may increase to 1 1/2 tablets daily after 1 week, then 2 tablets daily after another week. He states that he is only taking one every day and he is doing very well with it.    He reports excellent compliance with treatment. He reports excellent tolerance of treatment. He is not having side effects.   He feels his anxiety is "not a problem" and Improved since last visit.   Symptoms: No chest pain No difficulty concentrating  No dizziness No fatigue  No feelings of losing control No insomnia  No irritable No palpitations  No panic attacks No racing thoughts  No shortness of breath No sweating  No tremors/shakes    - said his neighbor noticed him shaking  GAD-7 Results GAD-7 Generalized Anxiety Disorder Screening Tool 09/17/2020  1. Feeling Nervous, Anxious, or on Edge 2  2. Not Being Able to Stop or Control Worrying 2  3. Worrying Too Much About Different Things 2  4. Trouble Relaxing 2  5. Being So Restless it's Hard To Sit Still 2  6. Becoming Easily Annoyed or Irritable 2  7. Feeling Afraid As If Something Awful Might Happen 0  Total GAD-7 Score 12  Difficulty At Work, Home, or Getting  Along With Others? Somewhat difficult    PHQ-9 Scores PHQ9 SCORE ONLY 09/17/2020 05/29/2020 05/12/2019  PHQ-9 Total Score 8 3 11     --------------------------------------------------------------------------------------------------- Depression, Follow-up  He  was last seen for this 1 months  ago. Changes made at last visit include; D/c escitalopram and start - sertraline (ZOLOFT) 100 MG tablet; Take 1 tablet (100 mg total) by mouth daily. Take in place of escitalopram. Start one tablet daily, may increase to 1 1/2 tablets daily after 1 week, then 2 tablets daily after another week.   He reports excellent compliance with treatment. He is not having side effects.   He reports excellent tolerance of treatment. Current symptoms include: N/A He feels he is Improved since last visit.  Depression screen [facility] 2/9 09/17/2020 05/29/2020 05/12/2019  Decreased Interest 1 0 1  Down, Depressed, Hopeless 2 0 2  PHQ - 2 Score 3 0 3  Altered sleeping 2 1 3   Tired, decreased energy 0 1 3  Change in appetite 1 1 1   Feeling bad or failure about yourself  1 0 0  Trouble concentrating 1 0 1  Moving slowly or fidgety/restless 0 0 0  Suicidal thoughts 0 0 0  PHQ-9 Score 8 3 11   Difficult doing work/chores Somewhat difficult Not difficult at all Not difficult at all    ----------------------------------------------------------------------------------------- Hypertension, follow-up  BP Readings from Last 3 Encounters:  10/07/20 (!) 145/93  09/17/20 (!) 150/102  05/29/20 125/90   Wt Readings from Last 3 Encounters:  10/07/20 (!) 355 lb 12.8 oz (161.4 kg)  09/17/20 (!) 359 lb (162.8 kg)  05/29/20 (!) 375 lb 12.8 oz (170.5 kg)     He was last seen for hypertension  1 months ago.  BP at that visit was 150/102. Management since that visit includes continue current medications.  He reports excellent compliance with treatment. He is not having side effects.  He is following a Regular diet. He is exercising. He does not smoke.   Use of agents associated with hypertension: NSAIDS.   Outside blood pressures are n/a. Symptoms: No chest pain No chest pressure  No palpitations No syncope  No dyspnea No orthopnea  No paroxysmal nocturnal dyspnea No lower extremity edema   Pertinent labs: Lab  Results  Component Value Date   CHOL 211 (H) 05/29/2020   HDL 49 05/29/2020   LDLCALC 135 (H) 05/29/2020   LDLDIRECT 99.0 04/06/2016   TRIG 149 05/29/2020   CHOLHDL 4.3 05/29/2020   Lab Results  Component Value Date   NA 139 05/29/2020   K 4.3 05/29/2020   CREATININE 0.94 05/29/2020   GFRNONAA 98 05/29/2020   GFRAA 114 05/29/2020   GLUCOSE 108 (H) 05/29/2020     The 10-year ASCVD risk score Mikey Bussing DC Jr., et al., 2013) is: 3%   ---------------------------------------------------------------------------------------------------      Medications: Outpatient Medications Prior to Visit  Medication Sig  . albuterol (VENTOLIN HFA) 108 (90 Base) MCG/ACT inhaler Inhale 2 puffs into the lungs every 6 (six) hours as needed for wheezing or shortness of breath.  . allopurinol (ZYLOPRIM) 100 MG tablet TAKE 1 TABLET BY MOUTH DAILY  . amLODipine (NORVASC) 10 MG tablet TAKE 1 TABLET BY MOUTH EVERY DAY  . aspirin 81 MG EC tablet Take 81 mg by mouth every other day.  . colchicine (COLCRYS) 0.6 MG tablet TAKE 2 TABLETS BY MOUTH TODAY, THEN 1 TABLET DAILY UNTIL SYMPTOMS IMPROVED  . fluticasone (FLONASE) 50 MCG/ACT nasal spray Place 2 sprays into both nostrils daily.  Marland Kitchen lisinopril-hydrochlorothiazide (ZESTORETIC) 10-12.5 MG tablet TAKE 1 TABLET BY MOUTH DAILY  . LORazepam (ATIVAN) 0.5 MG tablet TAKE 1 TABLET BY MOUTH TWICE DAILY AS NEEDED FOR ANXIETY  . metoprolol succinate (TOPROL-XL) 25 MG 24 hr tablet Take 0.5 tablets (12.5 mg total) by mouth daily.  Marland Kitchen omeprazole (PRILOSEC) 20 MG capsule Take 2 capsules (40 mg total) by mouth daily for 21 days.  Marland Kitchen rOPINIRole (REQUIP) 0.5 MG tablet 1/2 tablet at bedtime for 2 days, then one tablet at bedtime for 5 days, then two tablets at bedtime for restless legs.  . Semaglutide-Weight Management 0.5 MG/0.5ML SOAJ Inject 0.5 mg into the skin once a week.  . sertraline (ZOLOFT) 100 MG tablet Take 1 tablet (100 mg total) by mouth daily. Take in place of  escitalopram. Start one tablet daily, may increase to 1 1/2 tablets daily after 1 week, then 2 tablets daily after another week.  . topiramate (TOPAMAX) 50 MG tablet 1/2 tablet every night for 14 days, then increase to 1 tablet every night for 14 days, then 2 tablets every night   No facility-administered medications prior to visit.    Review of Systems     Objective    BP (!) 137/97 (BP Location: Right Arm, Patient Position: Sitting, Cuff Size: Large)   Pulse 71   Temp 97.7 F (36.5 C) (Temporal)   Ht 6' (1.829 m)   Wt (!) 355 lb (161 kg)   SpO2 94%   BMI 48.15 kg/m     Physical Exam   General appearance: Severely obese male, cooperative and in no acute distress Head: Normocephalic, without obvious abnormality, atraumatic Respiratory: Respirations even and unlabored, normal respiratory rate Extremities: All  extremities are intact.  Skin: Skin color, texture, turgor normal. No rashes seen  Psych: Appropriate mood and affect. Neurologic: Mental status: Alert, oriented to person, place, and time, thought content appropriate.     Assessment & Plan     1. Primary hypertension Well controlled.  Continue current medications.    2. Severe obesity (BMI >= 40) ([place]) He states Wegovey samples have been working well and has significant reduction in appetite and has been losing weight consistently since starting samples. He was given printed prescription and is going to get copay coupon from manufacturer.   - Semaglutide-Weight Management (WEGOVY) 2.4 MG/0.75ML SOAJ; Inject 2.4 mg into the skin once a week.  Dispense: 3 mL; Refill: 12   3. Anxiety Doing very well since change from escitalopram to sertraline, is only taking one a day refill - sertraline (ZOLOFT) 100 MG tablet; Take 1 tablet (100 mg total) by mouth daily.  Dispense: 90 tablet; Refill: 1  4. Panic attacks refill - sertraline (ZOLOFT) 100 MG tablet; Take 1 tablet (100 mg total) by mouth daily.  Dispense: 90 tablet;  Refill: 1  Follow up hypertension and chronic medications in about 6 months.        The entirety of the information documented in the History of Present Illness, Review of Systems and Physical Exam were personally obtained by me. Portions of this information were initially documented by the CMA and reviewed by me for thoroughness and accuracy.      Lelon Huh, MD  [facility] [phone] (phone) [phone] (fax)  [place]

## 2020-11-06 ENCOUNTER — Other Ambulatory Visit: Payer: Self-pay | Admitting: Family Medicine

## 2020-11-06 DIAGNOSIS — I10 Essential (primary) hypertension: Secondary | ICD-10-CM

## 2020-11-09 ENCOUNTER — Other Ambulatory Visit: Payer: Self-pay | Admitting: Family Medicine

## 2020-11-09 DIAGNOSIS — I10 Essential (primary) hypertension: Secondary | ICD-10-CM

## 2020-11-10 NOTE — Telephone Encounter (Signed)
Requested Prescriptions  Pending Prescriptions Disp Refills  . metoprolol succinate (TOPROL-XL) 25 MG 24 hr tablet [Pharmacy Med Name: METOPROLOL ER SUCCINATE 25MG  TABS] 45 tablet 1    Sig: TAKE 1/2 TABLET(12.5 MG) BY MOUTH DAILY     Cardiovascular:  Beta Blockers Failed - 11/09/2020 11:46 PM      Failed - Last BP in normal range    BP Readings from Last 1 Encounters:  10/28/20 (!) 137/97         Passed - Last Heart Rate in normal range    Pulse Readings from Last 1 Encounters:  10/28/20 71         Passed - Valid encounter within last 6 months    Recent Outpatient Visits          1 week ago Primary hypertension   [facility] Birdie Sons, MD   1 month ago [place], Donald E, MD   2 months ago Diverticulitis   [facility] Flinchum, Kelby Aline, FNP   5 months ago Severe obesity (BMI >= 40) [facility])   [facility] Birdie Sons, MD   6 months ago [place], Wendee Beavers, [place]      Future Appointments            In 5 months Fisher, Kirstie Peri, MD [facility], [place]

## 2020-11-14 ENCOUNTER — Telehealth: Payer: Self-pay

## 2020-11-14 NOTE — Telephone Encounter (Signed)
Pt notified of [place] through mychart.

## 2020-11-14 NOTE — Telephone Encounter (Signed)
-----   Message from Lucilla Lame, MD sent at 10/16/2020  5:30 PM EST ----- The patient know that there was some gallstones without any issues being caused by the gallstones but he does have diffuse fatty liver which is likely the cause of his abnormal liver enzymes.  The patient should try and lose weight and have his repeat labs checked in 3 months.

## 2020-11-20 ENCOUNTER — Other Ambulatory Visit: Payer: Self-pay | Admitting: Family Medicine

## 2020-11-20 DIAGNOSIS — M109 Gout, unspecified: Secondary | ICD-10-CM

## 2020-11-20 DIAGNOSIS — I10 Essential (primary) hypertension: Secondary | ICD-10-CM

## 2020-11-20 NOTE — Telephone Encounter (Signed)
Requested medications are due for refill today.  yes  Requested medications are on the active medications list.  yes  Last refill. 09/04/2019  Future visit scheduled.   yes  Notes to clinic.  Rx expired.

## 2020-11-20 NOTE — Telephone Encounter (Signed)
Requested Prescriptions  Pending Prescriptions Disp Refills  . allopurinol (ZYLOPRIM) 100 MG tablet [Pharmacy Med Name: ALLOPURINOL 100MG  TABLETS] 90 tablet 4    Sig: TAKE 1 TABLET BY MOUTH DAILY     Endocrinology:  Gout Agents Failed - 11/20/2020  6:24 AM      Failed - Uric Acid in normal range and within 360 days    Uric Acid  Date Value Ref Range Status  05/12/2019 7.4 3.7 - 8.6 mg/dL Final    Comment:               Therapeutic target for gout patients: <6.0         Passed - Cr in normal range and within 360 days    Creat  Date Value Ref Range Status  01/05/2014 0.93 0.50 - 1.35 mg/dL Final   Creatinine, Ser  Date Value Ref Range Status  05/29/2020 0.94 0.76 - 1.27 mg/dL Final   Creatinine,U  Date Value Ref Range Status  08/24/2012 90.2 mg/dL Final         Passed - Valid encounter within last 12 months    Recent Outpatient Visits          3 weeks ago Primary hypertension   [facility] Birdie Sons, MD   2 months ago [place], Donald E, MD   2 months ago Diverticulitis   [facility] Flinchum, Kelby Aline, FNP   5 months ago Severe obesity (BMI >= 40) [facility])   [facility] Birdie Sons, MD   7 months ago [place], Wendee Beavers, [place]      Future Appointments            In 5 months Fisher, Kirstie Peri, MD [facility], [facility]           . amLODipine ([place]) 10 MG tablet [Pharmacy Med Name: AMLODIPINE BESYLATE 10MG  TABLETS] 90 tablet 1    Sig: TAKE 1 TABLET BY MOUTH EVERY DAY     Cardiovascular:  Calcium Channel Blockers Failed - 11/20/2020  6:24 AM      Failed - Last BP in normal range    BP Readings from Last 1 Encounters:  10/28/20 (!) 137/97         Passed - Valid encounter within last 6 months    Recent Outpatient Visits          3 weeks ago Primary hypertension   [facility] Birdie Sons, MD   2 months  ago [place], Donald E, MD   2 months ago Diverticulitis   [facility] Flinchum, Kelby Aline, FNP   5 months ago Severe obesity (BMI >= 40) [facility])   [facility] Birdie Sons, MD   7 months ago [place], Wendee Beavers, [place]      Future Appointments            In 5 months Fisher, Kirstie Peri, MD [facility] [place], [place]

## 2021-01-08 ENCOUNTER — Telehealth: Payer: Self-pay

## 2021-01-08 NOTE — Telephone Encounter (Signed)
Pt notified of lab results through mychart.  

## 2021-01-08 NOTE — Telephone Encounter (Signed)
-----   Message from Lucilla Lame, MD sent at 01/07/2021 11:36 AM EDT ----- Let the patient know that I was waiting for 1 test to come back for his H. pylori stool studies prior to contacting him but it does not seem that the test was ever done.  The blood work showed him to have a low alpha-1 antitrypsin level which can cause liver problems.  He should have the alpha-1 antitrypsin genotype sent off.  He is immune to hepatitis A and hepatitis B and does not need any vaccinations.  The patient did have an ultrasound showing fatty liver which could also explain his abnormal liver enzymes.

## 2021-03-03 ENCOUNTER — Other Ambulatory Visit: Payer: Self-pay

## 2021-03-03 DIAGNOSIS — R748 Abnormal levels of other serum enzymes: Secondary | ICD-10-CM

## 2021-04-01 ENCOUNTER — Other Ambulatory Visit: Payer: Self-pay | Admitting: Family Medicine

## 2021-04-01 DIAGNOSIS — I10 Essential (primary) hypertension: Secondary | ICD-10-CM

## 2021-04-01 LAB — HEPATIC FUNCTION PANEL
ALT: 109 IU/L — ABNORMAL HIGH (ref 0–44)
AST: 90 IU/L — ABNORMAL HIGH (ref 0–40)
Albumin: 4.6 g/dL (ref 4.0–5.0)
Alkaline Phosphatase: 146 IU/L — ABNORMAL HIGH (ref 44–121)
Bilirubin Total: 0.7 mg/dL (ref 0.0–1.2)
Bilirubin, Direct: 0.22 mg/dL (ref 0.00–0.40)
Total Protein: 7.9 g/dL (ref 6.0–8.5)

## 2021-04-01 NOTE — Telephone Encounter (Signed)
   Notes to clinic:  Patient has upcoming appt on 04/21/2021 Review for 90 day refill   Requested Prescriptions  Pending Prescriptions Disp Refills   lisinopril-hydrochlorothiazide (ZESTORETIC) 10-12.5 MG tablet [Pharmacy Med Name: LISINOPRIL-HCTZ 10/12.'5MG'$  TABLETS] 90 tablet 1    Sig: TAKE 1 TABLET BY MOUTH DAILY     Cardiovascular:  ACEI + Diuretic Combos Failed - 04/01/2021  1:46 PM      Failed - Na in normal range and within 180 days    Sodium  Date Value Ref Range Status  05/29/2020 139 134 - 144 mmol/L Final          Failed - K in normal range and within 180 days    Potassium  Date Value Ref Range Status  05/29/2020 4.3 3.5 - 5.2 mmol/L Final          Failed - Cr in normal range and within 180 days    Creat  Date Value Ref Range Status  01/05/2014 0.93 0.50 - 1.35 mg/dL Final   Creatinine, Ser  Date Value Ref Range Status  05/29/2020 0.94 0.76 - 1.27 mg/dL Final   Creatinine,U  Date Value Ref Range Status  08/24/2012 90.2 mg/dL Final          Failed - Ca in normal range and within 180 days    Calcium  Date Value Ref Range Status  05/29/2020 9.3 8.7 - 10.2 mg/dL Final          Failed - Last BP in normal range    BP Readings from Last 1 Encounters:  10/28/20 (!) 137/97          Passed - Patient is not pregnant      Passed - Valid encounter within last 6 months    Recent Outpatient Visits           5 months ago Primary hypertension   [facility] Birdie Sons, MD   6 months ago [place], Donald E, MD   7 months ago Diverticulitis   [facility] Flinchum, Kelby Aline, FNP   10 months ago Severe obesity (BMI >= 40) [facility])   [facility] Birdie Sons, MD   11 months ago [place], Wendee Beavers, [place]       Future Appointments             In 2 weeks Caryn Section, Kirstie Peri, MD [facility], [place]

## 2021-04-21 ENCOUNTER — Encounter: Payer: Self-pay | Admitting: Family Medicine

## 2021-04-21 ENCOUNTER — Ambulatory Visit: Payer: Managed Care, Other (non HMO) | Admitting: Family Medicine

## 2021-04-21 ENCOUNTER — Other Ambulatory Visit: Payer: Self-pay

## 2021-04-21 DIAGNOSIS — F419 Anxiety disorder, unspecified: Secondary | ICD-10-CM

## 2021-04-21 DIAGNOSIS — Z23 Encounter for immunization: Secondary | ICD-10-CM | POA: Diagnosis not present

## 2021-04-21 DIAGNOSIS — R739 Hyperglycemia, unspecified: Secondary | ICD-10-CM | POA: Diagnosis not present

## 2021-04-21 DIAGNOSIS — F410 Panic disorder [episodic paroxysmal anxiety] without agoraphobia: Secondary | ICD-10-CM

## 2021-04-21 DIAGNOSIS — M109 Gout, unspecified: Secondary | ICD-10-CM

## 2021-04-21 LAB — POCT GLYCOSYLATED HEMOGLOBIN (HGB A1C)
Est. average glucose Bld gHb Est-mCnc: 134
Hemoglobin A1C: 6.3 % — AB (ref 4.0–5.6)

## 2021-04-21 MED ORDER — WEGOVY 2.4 MG/0.75ML ~~LOC~~ SOAJ
2.4000 mg | SUBCUTANEOUS | 12 refills | Status: DC
Start: 2021-04-21 — End: 2021-07-05

## 2021-04-21 MED ORDER — COLCHICINE 0.6 MG PO TABS: ORAL_TABLET | ORAL | 4 refills | Status: DC

## 2021-04-21 MED ORDER — SERTRALINE HCL 100 MG PO TABS: 100.0000 mg | ORAL_TABLET | Freq: Every day | ORAL | 1 refills | Status: DC

## 2021-04-21 NOTE — Patient Instructions (Signed)
Please review the attached list of medications and notify my office if there are any errors.   Please bring all of your medications to every appointment so we can make sure that our medication list is the same as yours.   Start samples Ozempic 0.'25mg'$  each week for 1-2 weeks, then increase to 0.'5mg'$  for 1-2 weeks, then increase to '1mg'$  (2 x 0.'5mg'$ ) for 1-2 weeks, then start prescription Wegovy at 2.'4mg'$  per week.

## 2021-04-21 NOTE — Progress Notes (Signed)
Established patient visit   Patient: Jared Watkins   DOB: 1976/03/31   45 y.o. Male  MRN: [id number] Visit Date: 04/21/2021  Today's healthcare provider: Lelon Huh, MD   Chief Complaint  Patient presents with   Hypertension   Anxiety   Hyperglycemia   Obesity   Subjective    HPI  Hypertension, follow-up  BP Readings from Last 3 Encounters:  04/21/21 (!) 138/93  10/28/20 (!) 137/97  10/07/20 (!) 145/93   Wt Readings from Last 3 Encounters:  04/21/21 (!) 370 lb (167.8 kg)  10/28/20 (!) 355 lb (161 kg)  10/07/20 (!) 355 lb 12.8 oz (161.4 kg)     He was last seen for hypertension 5 months ago.  BP at that visit was 137/97. Management since that visit includes continue same medications.  He reports good compliance with treatment. He is not having side effects.  He is following a Regular diet. He is not exercising. He does not smoke.  Use of agents associated with hypertension: none.   Outside blood pressures are not checked. Symptoms: No chest pain No chest pressure  No palpitations No syncope  No dyspnea No orthopnea  No paroxysmal nocturnal dyspnea No lower extremity edema   Pertinent labs: Lab Results  Component Value Date   CHOL 211 (H) 05/29/2020   HDL 49 05/29/2020   LDLCALC 135 (H) 05/29/2020   LDLDIRECT 99.0 04/06/2016   TRIG 149 05/29/2020   CHOLHDL 4.3 05/29/2020   Lab Results  Component Value Date   NA 139 05/29/2020   K 4.3 05/29/2020   CREATININE 0.94 05/29/2020   GFRNONAA 98 05/29/2020   GFRAA 114 05/29/2020   GLUCOSE 108 (H) 05/29/2020     The 10-year ASCVD risk score (Arnett DK, et al., 2019) is: 3%   ---------------------------------------------------------------------------------------------------   Anxiety and panic attacks, Follow-up  He was last seen for anxiety 5 months ago. Changes made at last visit include continue same medications.   He reports good compliance with treatment. He reports good tolerance of  treatment. He is not having side effects.   He feels his anxiety is moderate and Improved since last visit.  Symptoms: No chest pain No difficulty concentrating  No dizziness No fatigue  No feelings of losing control No insomnia  No irritable No palpitations  No panic attacks No racing thoughts  No shortness of breath No sweating  No tremors/shakes    GAD-7 Results GAD-7 Generalized Anxiety Disorder Screening Tool 09/17/2020  1. Feeling Nervous, Anxious, or on Edge 2  2. Not Being Able to Stop or Control Worrying 2  3. Worrying Too Much About Different Things 2  4. Trouble Relaxing 2  5. Being So Restless it's Hard To Sit Still 2  6. Becoming Easily Annoyed or Irritable 2  7. Feeling Afraid As If Something Awful Might Happen 0  Total GAD-7 Score 12  Difficulty At Work, Home, or Getting  Along With Others? Somewhat difficult    PHQ-9 Scores PHQ9 SCORE ONLY 04/21/2021 09/17/2020 05/29/2020  PHQ-9 Total Score '5 8 3    '$ ---------------------------------------------------------------------------------------------------   Follow up for severe obesity:  The patient was last seen for this 5 months ago. Changes made at last visit include none; continue Wegovy.  He reports poor compliance with treatment. He feels that condition is Worse. Patients insurance does not cover [id number]. He is not having side effects. He was noted to have had 25 pounds weigh loss on [facility]  Wt Readings from  Last 5 Encounters:  04/21/21 (!) 370 lb (167.8 kg)  10/28/20 (!) 355 lb (161 kg)  10/07/20 (!) 355 lb 12.8 oz (161.4 kg)  09/17/20 (!) 359 lb (162.8 kg)  05/29/20 (!) 375 lb 12.8 oz (170.5 kg)     -----------------------------------------------------------------------------------------   Hyperglycemia, Follow-up  Lab Results  Component Value Date   HGBA1C 6.1 (H) 05/29/2020   HGBA1C 5.3 04/06/2016   HGBA1C 5.6 04/05/2013   GLUCOSE 108 (H) 05/29/2020   GLUCOSE 108 (H) 05/12/2019   GLUCOSE 81  11/02/2017    Last seen for for this11 months ago.  Management since that visit includes started [facility] to help with weight loss. Current symptoms include none and have been stable.  Prior visit with dietician: no Current diet: in general, an "unhealthy" diet Current exercise: none  -----------------------------------------------------------------------------------------     Medications: Outpatient Medications Prior to Visit  Medication Sig   albuterol (VENTOLIN HFA) 108 (90 Base) MCG/ACT inhaler Inhale 2 puffs into the lungs every 6 (six) hours as needed for wheezing or shortness of breath.   allopurinol (ZYLOPRIM) 100 MG tablet Take 1 tablet (100 mg total) by mouth daily.   amLODipine (NORVASC) 10 MG tablet TAKE 1 TABLET BY MOUTH EVERY DAY   fluticasone (FLONASE) 50 MCG/ACT nasal spray Place 2 sprays into both nostrils daily.   lisinopril-hydrochlorothiazide (ZESTORETIC) 10-12.5 MG tablet TAKE 1 TABLET BY MOUTH DAILY   LORazepam (ATIVAN) 0.5 MG tablet TAKE 1 TABLET BY MOUTH TWICE DAILY AS NEEDED FOR ANXIETY   metoprolol succinate (TOPROL-XL) 25 MG 24 hr tablet TAKE 1/2 TABLET(12.5 MG) BY MOUTH DAILY   sertraline (ZOLOFT) 100 MG tablet Take 1 tablet (100 mg total) by mouth daily.   aspirin 81 MG EC tablet Take 81 mg by mouth every other day. (Patient not taking: Reported on 04/21/2021)   Semaglutide-Weight Management (WEGOVY) 2.4 MG/0.75ML SOAJ Inject 2.4 mg into the skin once a week. (Patient not taking: Reported on 04/21/2021)   No facility-administered medications prior to visit.    Review of Systems  Constitutional:  Negative for appetite change, chills and fever.  Respiratory:  Negative for chest tightness, shortness of breath and wheezing.   Cardiovascular:  Negative for chest pain and palpitations.  Gastrointestinal:  Negative for abdominal pain, nausea and vomiting.      Objective    BP (!) 138/93 (BP Location: Right Arm, Patient Position: Sitting, Cuff Size: Large)    Pulse 83   Temp (!) 97 F (36.1 C) (Temporal)   Resp (!) 22   Wt (!) 370 lb (167.8 kg)   BMI 50.18 kg/m  {Show previous vital signs (optional):23777}  Physical Exam   General appearance: Severely obese male, cooperative and in no acute distress Head: Normocephalic, without obvious abnormality, atraumatic Respiratory: Respirations even and unlabored, normal respiratory rate Extremities: All extremities are intact.  Skin: Skin color, texture, turgor normal. No rashes seen  Psych: Appropriate mood and affect. Neurologic: Mental status: Alert, oriented to person, place, and time, thought content appropriate.   Results for orders placed or performed in visit on 04/21/21  POCT HgB A1C  Result Value Ref Range   Hemoglobin A1C 6.3 (A) 4.0 - 5.6 %   Est. average glucose Bld gHb Est-mCnc 134     Assessment & Plan      1. Severe obesity (BMI >= 40) ([place]) Did well with wegovy, but now out of medication due to cost. He was given two sample pens of semaglutide 0.25/0.5 to start back on titration.  He states he may just pay out of pocket for wegovy and given printed prescription today.  - Semaglutide-Weight Management (WEGOVY) 2.4 MG/0.75ML SOAJ; Inject 2.4 mg into the skin once a week.  Dispense: 3 mL; Refill: 12  2. Hyperglycemia Nearing diabetic range, needs to lose weight.   3. Anxiety Very well controlled. refill- sertraline (ZOLOFT) 100 MG tablet; Take 1 tablet (100 mg total) by mouth daily.  Dispense: 90 tablet; Refill: 1  4. Panic attacks refill - sertraline (ZOLOFT) 100 MG tablet; Take 1 tablet (100 mg total) by mouth daily.  Dispense: 90 tablet; Refill: 1  5. Gout of left elbow, unspecified cause, unspecified chronicity refill- colchicine (COLCRYS) 0.6 MG tablet; TAKE 2 TABLETS BY MOUTH TODAY, THEN 1 TABLET DAILY UNTIL SYMPTOMS IMPROVED  Dispense: 90 tablet; Refill: 4  6. Need for immunization against influenza  - Flu Vaccine QUAD 53moIM (Fluarix, Fluzone & Alfiuria Quad PF)       Future Appointments  Date Time Provider [place] 10/20/2021  9:40 AM FCaryn Section DKirstie Peri MD [facility]     The entirety of the information documented in the History of Present Illness, Review of Systems and Physical Exam were personally obtained by me. Portions of this information were initially documented by the CMA and reviewed by me for thoroughness and accuracy.     DLelon Huh MD  [phone](phone) [phone](fax)  [place]

## 2021-04-29 ENCOUNTER — Other Ambulatory Visit: Payer: Self-pay | Admitting: Family Medicine

## 2021-04-29 DIAGNOSIS — I10 Essential (primary) hypertension: Secondary | ICD-10-CM

## 2021-05-05 ENCOUNTER — Telehealth: Payer: Self-pay

## 2021-05-05 DIAGNOSIS — J40 Bronchitis, not specified as acute or chronic: Secondary | ICD-10-CM

## 2021-05-05 MED ORDER — ALBUTEROL SULFATE HFA 108 (90 BASE) MCG/ACT IN AERS: 2.0000 | INHALATION_SPRAY | Freq: Four times a day (QID) | RESPIRATORY_TRACT | 2 refills | Status: DC | PRN

## 2021-05-05 NOTE — Telephone Encounter (Signed)
[facility] faxed refill request for the following medications:   albuterol (VENTOLIN HFA) 108 (90 Base) MCG/ACT inhaler   Please advise.  

## 2021-06-25 ENCOUNTER — Telehealth: Payer: Self-pay | Admitting: Family Medicine

## 2021-06-25 NOTE — Telephone Encounter (Signed)
Patient states pharmacy never has Semaglutide-Weight Management (WEGOVY) 2.4 MG/0.75ML SOAJ in stock. Patient will run out on Friday of the Ozempic and would like PCP to send in [place] to [place]. Also if PCP has samples please notify caller at work best #  [phone].  [facility][id number] [address], [place] [address] Phone:  [phone]  Fax:  [phone]

## 2021-06-27 MED ORDER — SEMAGLUTIDE (2 MG/DOSE) 8 MG/3ML ~~LOC~~ SOPN: 2.0000 mg | PEN_INJECTOR | SUBCUTANEOUS | 4 refills | Status: DC

## 2021-07-04 NOTE — Telephone Encounter (Signed)
Pt's wife is calling back about the prescription of Wegovy or Ozempic   She said he is out of the medication now and she states no one ever called he back.  CB#  [phone] or [phone]

## 2021-07-04 NOTE — Telephone Encounter (Signed)
Pts wife is calling upset  to follow up on the script for Semaglutide-Weight Management (WEGOVY) 2.4 MG/0.75ML SOAJ advised that the medication was sent on 06/25/21. Pts wife is unsure exactly but states that the pharmacy stated that there was a problem with the medication and the patient's insurance.   [id number] [address], [place] Phone:  [phone]  Fax:  [phone]

## 2021-07-05 NOTE — Telephone Encounter (Signed)
Attempted to contact [place] to request information regarding patient's insurance and what pharmacy needs prior to getting medication filled. On hold extended time and unable to wait .

## 2021-07-05 NOTE — Addendum Note (Signed)
Addended by: Mliss Sax on: 07/05/2021 07:30 AM   Modules accepted: Orders

## 2021-07-07 ENCOUNTER — Telehealth: Payer: Self-pay | Admitting: Family Medicine

## 2021-07-07 MED ORDER — OMEPRAZOLE 20 MG PO CPDR: 20.0000 mg | DELAYED_RELEASE_CAPSULE | Freq: Every day | ORAL | 1 refills | Status: DC

## 2021-07-07 NOTE — Telephone Encounter (Signed)
Please review.  We do not have samples of Ozempic   Thanks,   -Mickel Baas

## 2021-07-07 NOTE — Telephone Encounter (Signed)
Pt's wife Peter Congo has called back again concerning Ozempic or Semaglutide-Weight Management (WEGOVY) 2.4 MG/0.75ML  [facility] does not have either in stock. She is requesting samples but states pt will need to pick up today because he will be out of town the rest of the week. If you have no samples she would like this [facility] Their phone number is [phone]

## 2021-07-07 NOTE — Telephone Encounter (Signed)
[facility] faxed refill request for the following medications:   omeprazole (PRILOSEC) 20 MG capsule   Please advise.

## 2021-07-07 NOTE — Addendum Note (Signed)
Addended by: Ashley Royalty E on: 07/07/2021 03:28 PM   Modules accepted: Orders

## 2021-07-08 ENCOUNTER — Telehealth: Payer: Self-pay

## 2021-07-08 MED ORDER — WEGOVY 2.4 MG/0.75ML ~~LOC~~ SOAJ: 2.4000 mg | SUBCUTANEOUS | 4 refills | Status: DC

## 2021-07-08 NOTE — Addendum Note (Signed)
Addended by: Lelon Huh E on: 07/08/2021 01:40 PM   Modules accepted: Orders

## 2021-07-08 NOTE — Telephone Encounter (Signed)
-----   Message from Lucilla Lame, MD sent at 07/07/2021 11:13 AM EST ----- Let the patient know that the primary care provider sent me a copy of his labs and he should have them repeated in January of 2023.  If the liver enzymes are still elevated the patient would be recommended to undergo a liver biopsy to make sure there is nothing else causing his abnormal liver enzymes since the ultrasound did not report any fatty liver despite his last weight being over 370 pounds.

## 2021-07-08 NOTE — Telephone Encounter (Signed)
Copied from [place] [id number]. Topic: General - Other >> Jul 08, 2021  4:17 PM Pawlus, Brayton Layman A wrote: Reason for CRM: Pts wife called in regarding the pt, requested to speak directly with Arbie Cookey, please advise.

## 2021-07-08 NOTE — Telephone Encounter (Signed)
Pt notified of lab results through mychart.  

## 2021-07-09 ENCOUNTER — Telehealth: Payer: Self-pay

## 2021-07-09 NOTE — Telephone Encounter (Signed)
Left message with Pt's wife Billee Cashing (On DPR)  That we have a sample of Ozempic for him and also please verify what pharmacy we need to send [facility].   Thanks,   -Mickel Baas

## 2021-07-09 NOTE — Telephone Encounter (Signed)
She verified that [facility] to send Wegovy to / due to local pharmacy not having any Wegovy / pt advised of Ozempic sample

## 2021-07-18 ENCOUNTER — Other Ambulatory Visit: Payer: Self-pay | Admitting: Family Medicine

## 2021-07-18 DIAGNOSIS — I10 Essential (primary) hypertension: Secondary | ICD-10-CM

## 2021-07-18 NOTE — Telephone Encounter (Signed)
Requested medication (s) are due for refill today: yes  Requested medication (s) are on the active medication list: yes  Last refill:  04/01/21 #90/0RF  Future visit scheduled: yes  Notes to clinic:  Unable to refill per protocol due to failed labs, no updated results.      Requested Prescriptions  Pending Prescriptions Disp Refills   lisinopril-hydrochlorothiazide (ZESTORETIC) 10-12.5 MG tablet [Pharmacy Med Name: LISINOPRIL-HCTZ 10/12.5MG  TABLETS] 90 tablet 0    Sig: TAKE 1 TABLET BY MOUTH DAILY     Cardiovascular:  ACEI + Diuretic Combos Failed - 07/18/2021  5:32 PM      Failed - Na in normal range and within 180 days    Sodium  Date Value Ref Range Status  05/29/2020 139 134 - 144 mmol/L Final          Failed - K in normal range and within 180 days    Potassium  Date Value Ref Range Status  05/29/2020 4.3 3.5 - 5.2 mmol/L Final          Failed - Cr in normal range and within 180 days    Creat  Date Value Ref Range Status  01/05/2014 0.93 0.50 - 1.35 mg/dL Final   Creatinine, Ser  Date Value Ref Range Status  05/29/2020 0.94 0.76 - 1.27 mg/dL Final   Creatinine,U  Date Value Ref Range Status  08/24/2012 90.2 mg/dL Final          Failed - Ca in normal range and within 180 days    Calcium  Date Value Ref Range Status  05/29/2020 9.3 8.7 - 10.2 mg/dL Final          Failed - Last BP in normal range    BP Readings from Last 1 Encounters:  04/21/21 (!) 138/93          Passed - Patient is not pregnant      Passed - Valid encounter within last 6 months    Recent Outpatient Visits           2 months ago Severe obesity (BMI >= 40) ([place])   [facility] Birdie Sons, MD   8 months ago Primary hypertension   [facility] Birdie Sons, MD   10 months ago [place], Donald E, MD   10 months ago Diverticulitis   [facility] Flinchum, Kelby Aline, [place]   1 year ago Severe  obesity (BMI >= 40) [facility])   [facility] Birdie Sons, MD       Future Appointments             In 3 months Fisher, Kirstie Peri, MD [facility], [place]

## 2021-08-07 ENCOUNTER — Other Ambulatory Visit: Payer: Self-pay | Admitting: Family Medicine

## 2021-08-07 ENCOUNTER — Telehealth: Payer: Self-pay | Admitting: *Deleted

## 2021-08-07 DIAGNOSIS — J40 Bronchitis, not specified as acute or chronic: Secondary | ICD-10-CM

## 2021-08-07 NOTE — Telephone Encounter (Signed)
Per patient's wife. Patient needs PA for [facility]. Please advise?

## 2021-08-07 NOTE — Telephone Encounter (Signed)
Patient's wife stated she read where amlodipine is being recalled due to a cancer causing agent. Patient wants to know it medication is being recalled and if so is there an alternative can take? Please advise? Please advise wife?

## 2021-08-07 NOTE — Telephone Encounter (Signed)
Requested Prescriptions  Pending Prescriptions Disp Refills   albuterol (VENTOLIN HFA) 108 (90 Base) MCG/ACT inhaler [Pharmacy Med Name: ALBUTEROL HFA INH (200 PUFFS) 6.7GM] 6.7 g 0    Sig: INHALE 2 PUFFS INTO THE LUNGS EVERY 6 HOURS AS NEEDED FOR WHEEZING OR SHORTNESS OF BREATH     Pulmonology:  Beta Agonists Failed - 08/07/2021 11:15 AM      Failed - One inhaler should last at least one month. If the patient is requesting refills earlier, contact the patient to check for uncontrolled symptoms.      Passed - Valid encounter within last 12 months    Recent Outpatient Visits          3 months ago Severe obesity (BMI >= 40) ([place])   [facility] Birdie Sons, MD   9 months ago Primary hypertension   [facility] Birdie Sons, MD   10 months ago [place], Donald E, MD   11 months ago Diverticulitis   [facility] Flinchum, Kelby Aline, FNP   1 year ago Severe obesity (BMI >= 40) [facility])   [facility] Birdie Sons, MD      Future Appointments            In 2 months Fisher, Kirstie Peri, MD [facility], [place]

## 2021-08-08 NOTE — Telephone Encounter (Signed)
I called [facility] and was advised that [facility] requires a prior authorization. [facility] has started a Key in covermymeds for our office to complete the prior authoriation. Since its after 5pm, will have to complete prior authorization when our office reopens on Tuesday 08/12/2021.   Key: [id number]

## 2021-08-08 NOTE — Telephone Encounter (Addendum)
It's not the formulation of amlodipine he is taking, it's a combo pill of valsartan and amlodipine. If he has any questions about the recall he needs to talk to his pharmacist.

## 2021-08-08 NOTE — Telephone Encounter (Signed)
Patient's wife stated she is not sure if medication needs a PA or if there was an issue with how the directions were written. Patient's wife is requesting we call insurance today find out what the problem is. [phone].

## 2021-08-08 NOTE — Telephone Encounter (Signed)
Patient's wife was advised.  

## 2021-08-12 NOTE — Telephone Encounter (Signed)
Prior authorization approved. Patient notified.   Message from covermymeds:  Your prior authorization for Mancel Parsons has been approved! MORE INFO Personalized support and financial assistance may be available through the [employer] program. For more information, and to see program requirements, click on the More Info button to the right.  Message from plan: Request Reference Number: [id number]. WEGOVY INJ 2.4MG  is approved through 03/12/2022. Your patient may now fill this prescription and it will be covered.

## 2021-08-12 NOTE — Telephone Encounter (Signed)
Prior authorization completed via covermy meds. Awaiting determination response.

## 2021-08-18 ENCOUNTER — Telehealth: Payer: Self-pay

## 2021-08-18 DIAGNOSIS — I10 Essential (primary) hypertension: Secondary | ICD-10-CM

## 2021-08-18 MED ORDER — AMLODIPINE BESYLATE 10 MG PO TABS: 10.0000 mg | ORAL_TABLET | Freq: Every day | ORAL | 1 refills | Status: DC

## 2021-08-18 NOTE — Telephone Encounter (Signed)
[facility] faxed refill request for the following medications: ° °amLODipine (NORVASC) 10 MG tablet  ° ° °Please advise. °

## 2021-09-02 ENCOUNTER — Other Ambulatory Visit: Payer: Self-pay | Admitting: Family Medicine

## 2021-09-02 DIAGNOSIS — J40 Bronchitis, not specified as acute or chronic: Secondary | ICD-10-CM

## 2021-10-17 NOTE — Progress Notes (Signed)
I,Roshena L Chambers,acting as a scribe for Lelon Huh, MD.,have documented all relevant documentation on the behalf of Lelon Huh, MD,as directed by  Lelon Huh, MD while in the presence of Lelon Huh, MD.   Established patient visit   Patient: Jared Watkins   DOB: 12-15-1975   46 y.o. Male  MRN: [id number] Visit Date: 10/20/2021  Today's healthcare provider: Lelon Huh, MD   Chief Complaint  Patient presents with   Hyperglycemia   Hypertension   Anxiety   Subjective    HPI  Hyperglycemia, Follow-up  Lab Results  Component Value Date   HGBA1C 6.3 (A) 04/21/2021   HGBA1C 6.1 (H) 05/29/2020   HGBA1C 5.3 04/06/2016   GLUCOSE 108 (H) 05/29/2020   GLUCOSE 108 (H) 05/12/2019   GLUCOSE 81 11/02/2017    Last seen for for this 6 months ago.  Management since that visit includes advising patient to work on weight loss. Current symptoms include none and have been stable.  Prior visit with dietician: no Current diet: in general, an "unhealthy" diet Current exercise: none  Pertinent Labs:    Component Value Date/Time   CHOL 211 (H) 05/29/2020 1030   TRIG 149 05/29/2020 1030   CHOLHDL 4.3 05/29/2020 1030   CHOLHDL 4 04/06/2016 0919   CREATININE 0.94 05/29/2020 1030   CREATININE 0.93 01/05/2014 1545     -----------------------------------------------------------------------------------------   Hypertension, follow-up  BP Readings from Last 3 Encounters:  10/20/21 (!) 132/102  04/21/21 (!) 138/93  10/28/20 (!) 137/97   Wt Readings from Last 3 Encounters:  10/20/21 (!) 350 lb (158.8 kg)  04/21/21 (!) 370 lb (167.8 kg)  10/28/20 (!) 355 lb (161 kg)     He was last seen for hypertension 6 months ago.  BP at that visit was 137/97. Management since that visit includes continuing same medication.  He reports good compliance with treatment. He is not having side effects.  He is following a Regular diet. He is not exercising. He does not  smoke.  Use of agents associated with hypertension: NSAIDS.   Outside blood pressures are checked occasionally. Symptoms: No chest pain No chest pressure  No palpitations No syncope  No dyspnea No orthopnea  No paroxysmal nocturnal dyspnea No lower extremity edema     ---------------------------------------------------------------------------------------------------   Anxiety, Follow-up  He was last seen for anxiety 6 months ago. Changes made at last visit include none; continue same medication.   He reports good compliance with treatment. He reports good tolerance of treatment. He is not having side effects.   He feels his anxiety is mild and Unchanged since last visit.  Symptoms: No chest pain No difficulty concentrating  No dizziness No fatigue  No feelings of losing control No insomnia  No irritable No palpitations  No panic attacks No racing thoughts  No shortness of breath No sweating  No tremors/shakes    GAD-7 Results GAD-7 Generalized Anxiety Disorder Screening Tool 09/17/2020  1. Feeling Nervous, Anxious, or on Edge 2  2. Not Being Able to Stop or Control Worrying 2  3. Worrying Too Much About Different Things 2  4. Trouble Relaxing 2  5. Being So Restless it's Hard To Sit Still 2  6. Becoming Easily Annoyed or Irritable 2  7. Feeling Afraid As If Something Awful Might Happen 0  Total GAD-7 Score 12  Difficulty At Work, Home, or Getting  Along With Others? Somewhat difficult    PHQ-9 Scores PHQ9 SCORE ONLY 04/21/2021 09/17/2020 05/29/2020  PHQ-9 Total Score '5 8 3    '$ ---------------------------------------------------------------------------------------------------  Follow up for obesity:  The patient was last seen for this 6 months ago. Changes made at last visit include: He was given two sample pens of semaglutide 0.25/0.5 to start back on titration. He stated he may just pay out of pocket for wegovy and given printed prescription.  He reports good  compliance with treatment. He feels that condition is Improved. He is not having side effects.  Wt Readings from Last 8 Encounters:  10/20/21 (!) 350 lb (158.8 kg)  04/21/21 (!) 370 lb (167.8 kg)  10/28/20 (!) 355 lb (161 kg)  10/07/20 (!) 355 lb 12.8 oz (161.4 kg)  09/17/20 (!) 359 lb (162.8 kg)  05/29/20 (!) 375 lb 12.8 oz (170.5 kg)  05/12/19 (!) 344 lb (156 kg)  10/05/18 (!) 320 lb (145.2 kg)     -----------------------------------------------------------------------------------------   Medications: Outpatient Medications Prior to Visit  Medication Sig   albuterol (VENTOLIN HFA) 108 (90 Base) MCG/ACT inhaler INHALE 2 PUFFS INTO THE LUNGS EVERY 6 HOURS AS NEEDED FOR WHEEZING OR SHORTNESS OF BREATH   allopurinol (ZYLOPRIM) 100 MG tablet Take 1 tablet (100 mg total) by mouth daily.   amLODipine (NORVASC) 10 MG tablet Take 1 tablet (10 mg total) by mouth daily.   aspirin 81 MG EC tablet Take 81 mg by mouth every other day. (Patient not taking: Reported on 04/21/2021)   colchicine (COLCRYS) 0.6 MG tablet TAKE 2 TABLETS BY MOUTH TODAY, THEN 1 TABLET DAILY UNTIL SYMPTOMS IMPROVED   fluticasone (FLONASE) 50 MCG/ACT nasal spray Place 2 sprays into both nostrils daily.   lisinopril-hydrochlorothiazide (ZESTORETIC) 10-12.5 MG tablet TAKE 1 TABLET BY MOUTH DAILY   LORazepam (ATIVAN) 0.5 MG tablet TAKE 1 TABLET BY MOUTH TWICE DAILY AS NEEDED FOR ANXIETY   metoprolol succinate (TOPROL-XL) 25 MG 24 hr tablet TAKE 1/2 TABLET(12.5 MG) BY MOUTH DAILY   omeprazole (PRILOSEC) 20 MG capsule Take 1 capsule (20 mg total) by mouth daily.   Semaglutide-Weight Management (WEGOVY) 2.4 MG/0.75ML SOAJ Inject 2.4 mg into the skin once a week.   sertraline (ZOLOFT) 100 MG tablet Take 1 tablet (100 mg total) by mouth daily.   No facility-administered medications prior to visit.    Review of Systems  Constitutional:  Negative for appetite change, chills and fever.  Respiratory:  Negative for chest tightness,  shortness of breath and wheezing.   Cardiovascular:  Negative for chest pain and palpitations.  Gastrointestinal:  Negative for abdominal pain, nausea and vomiting.      Objective    BP (!) 132/102 (BP Location: Left Arm, Patient Position: Sitting, Cuff Size: Large)    Pulse 85    Temp 97.7 F (36.5 C) (Oral)    Resp 20    Ht 6' (1.829 m)    Wt (!) 350 lb (158.8 kg)    SpO2 96%    BMI 47.47 kg/m   Today's Vitals   10/20/21 0955 10/20/21 0959  BP: (!) 151/106 (!) 132/102  Pulse: 85   Resp: 20   Temp: 97.7 F (36.5 C)   TempSrc: Oral   SpO2: 96%   Weight: (!) 350 lb (158.8 kg)   Height: 6' (1.829 m)    Body mass index is 47.47 kg/m.    Physical Exam    General: Appearance:    Severely obese male in no acute distress  Eyes:    PERRL, conjunctiva/corneas clear, EOM's intact       Lungs:  Clear to auscultation bilaterally, respirations unlabored  Heart:    Normal heart rate. Normal rhythm. No murmurs, rubs, or gallops.    MS:   All extremities are intact.    Neurologic:   Awake, alert, oriented x 3. No apparent focal neurological defect.          Assessment & Plan     1. Severe obesity (BMI >= 40) (HCC) Does well with GLP-1 agonists when he is able to get them, is cost prohibitive. Given sample of 0.25/0.'5mg'$  pen and advised to check website for discount programs.  2. Hyperglycemia  - Hemoglobin A1c  3. Primary hypertension Well controlled.    He briefly stopped amlodipine a few months ago and states his aches and pains and generally fatigue was significantly better, but return after starting back on medication. Consider reducing dose and increasing lisinopril after review labs.     4. Fatty liver  - Comprehensive metabolic panel  5. Hyperlipidemia, unspecified hyperlipidemia type Consider adding statin.  - CBC - Lipid panel  6. Need for tetanus, diphtheria, and acellular pertussis (Tdap) vaccine in patient of adolescent age or older  - Administer  Tetanus-diphtheria-acellular pertussis (Tdap) vaccine  7. Colon cancer screening  - Ambulatory referral to gastroenterology for colonoscopy      The entirety of the information documented in the History of Present Illness, Review of Systems and Physical Exam were personally obtained by me. Portions of this information were initially documented by the CMA and reviewed by me for thoroughness and accuracy.     Lelon Huh, MD  [facility] [phone] (phone) [phone] (fax)  [place]

## 2021-10-20 ENCOUNTER — Other Ambulatory Visit: Payer: Self-pay

## 2021-10-20 ENCOUNTER — Ambulatory Visit: Payer: Managed Care, Other (non HMO) | Admitting: Family Medicine

## 2021-10-20 ENCOUNTER — Encounter: Payer: Self-pay | Admitting: Family Medicine

## 2021-10-20 DIAGNOSIS — I10 Essential (primary) hypertension: Secondary | ICD-10-CM | POA: Diagnosis not present

## 2021-10-20 DIAGNOSIS — K760 Fatty (change of) liver, not elsewhere classified: Secondary | ICD-10-CM

## 2021-10-20 DIAGNOSIS — Z1211 Encounter for screening for malignant neoplasm of colon: Secondary | ICD-10-CM

## 2021-10-20 DIAGNOSIS — Z23 Encounter for immunization: Secondary | ICD-10-CM

## 2021-10-20 DIAGNOSIS — R739 Hyperglycemia, unspecified: Secondary | ICD-10-CM | POA: Diagnosis not present

## 2021-10-20 DIAGNOSIS — E785 Hyperlipidemia, unspecified: Secondary | ICD-10-CM

## 2021-10-20 MED ORDER — NA SULFATE-K SULFATE-MG SULF 17.5-3.13-1.6 GM/177ML PO SOLN: 1.0000 | Freq: Once | ORAL | 0 refills | Status: AC

## 2021-10-20 NOTE — Progress Notes (Signed)
Gastroenterology Pre-Procedure Review ? ?Request Date: 11/10/2021 ?Requesting Physician: Dr. Jae Dire ? ?PATIENT REVIEW QUESTIONS: The patient responded to the following health history questions as indicated:   ? ?1. Are you having any GI issues? no ?2. Do you have a personal history of Polyps? no ?3. Do you have a family history of Colon Cancer or Polyps? no ?4. Diabetes Mellitus? no ?5. Joint replacements in the past 12 months?no ?6. Major health problems in the past 3 months?no ?7. Any artificial heart valves, MVP, or defibrillator?no ?   ?MEDICATIONS & ALLERGIES:    ?Patient reports the following regarding taking any anticoagulation/antiplatelet therapy:   ?Plavix, Coumadin, Eliquis, Xarelto, Lovenox, Pradaxa, Brilinta, or Effient? no ?Aspirin? no ? ?Patient confirms/reports the following medications:  ?Current Outpatient Medications  ?Medication Sig Dispense Refill  ? albuterol (VENTOLIN HFA) 108 (90 Base) MCG/ACT inhaler INHALE 2 PUFFS INTO THE LUNGS EVERY 6 HOURS AS NEEDED FOR WHEEZING OR SHORTNESS OF BREATH 18 g 4  ? allopurinol (ZYLOPRIM) 100 MG tablet Take 1 tablet (100 mg total) by mouth daily. 90 tablet 4  ? amLODipine (NORVASC) 10 MG tablet Take 1 tablet (10 mg total) by mouth daily. 90 tablet 1  ? aspirin 81 MG EC tablet Take 81 mg by mouth every other day. (Patient not taking: Reported on 04/21/2021)    ? colchicine (COLCRYS) 0.6 MG tablet TAKE 2 TABLETS BY MOUTH TODAY, THEN 1 TABLET DAILY UNTIL SYMPTOMS IMPROVED 90 tablet 4  ? fluticasone (FLONASE) 50 MCG/ACT nasal spray Place 2 sprays into both nostrils daily. 16 g 1  ? lisinopril-hydrochlorothiazide (ZESTORETIC) 10-12.5 MG tablet TAKE 1 TABLET BY MOUTH DAILY 90 tablet 4  ? LORazepam (ATIVAN) 0.5 MG tablet TAKE 1 TABLET BY MOUTH TWICE DAILY AS NEEDED FOR ANXIETY 30 tablet 5  ? metoprolol succinate (TOPROL-XL) 25 MG 24 hr tablet TAKE 1/2 TABLET(12.5 MG) BY MOUTH DAILY 45 tablet 1  ? omeprazole (PRILOSEC) 20 MG capsule Take 1 capsule (20 mg total) by mouth  daily. 90 capsule 1  ? Semaglutide-Weight Management (WEGOVY) 2.4 MG/0.75ML SOAJ Inject 2.4 mg into the skin once a week. 9 mL 4  ? sertraline (ZOLOFT) 100 MG tablet Take 1 tablet (100 mg total) by mouth daily. 90 tablet 1  ? ?No current facility-administered medications for this visit.  ? ? ?Patient confirms/reports the following allergies:  ?No Known Allergies ? ?No orders of the defined types were placed in this encounter. ? ? ?AUTHORIZATION INFORMATION ?Primary Insurance: ?1D#: ?Group #: ? ?Secondary Insurance: ?1D#: ?Group #: ? ?SCHEDULE INFORMATION: ?Date: 11/10/2021 ?Time: ?Location: ?msc ?

## 2021-10-20 NOTE — Patient Instructions (Signed)
Please review the attached list of medications and notify my office if there are any errors.  ? ?We need to adjust your medications. If you labs are normal we can increase the dose of lisinopril-hctz ?

## 2021-10-21 LAB — CBC
Hematocrit: 49 % (ref 37.5–51.0)
Hemoglobin: 16.4 g/dL (ref 13.0–17.7)
MCH: 29.1 pg (ref 26.6–33.0)
MCHC: 33.5 g/dL (ref 31.5–35.7)
MCV: 87 fL (ref 79–97)
Platelets: 255 10*3/uL (ref 150–450)
RBC: 5.64 x10E6/uL (ref 4.14–5.80)
RDW: 13.5 % (ref 11.6–15.4)
WBC: 11 10*3/uL — ABNORMAL HIGH (ref 3.4–10.8)

## 2021-10-21 LAB — COMPREHENSIVE METABOLIC PANEL
ALT: 46 IU/L — ABNORMAL HIGH (ref 0–44)
AST: 42 IU/L — ABNORMAL HIGH (ref 0–40)
Albumin/Globulin Ratio: 1.5 (ref 1.2–2.2)
Albumin: 4.8 g/dL (ref 4.0–5.0)
Alkaline Phosphatase: 131 IU/L — ABNORMAL HIGH (ref 44–121)
BUN/Creatinine Ratio: 17 (ref 9–20)
BUN: 18 mg/dL (ref 6–24)
Bilirubin Total: 0.5 mg/dL (ref 0.0–1.2)
CO2: 25 mmol/L (ref 20–29)
Calcium: 10.1 mg/dL (ref 8.7–10.2)
Chloride: 97 mmol/L (ref 96–106)
Creatinine, Ser: 1.08 mg/dL (ref 0.76–1.27)
Globulin, Total: 3.1 g/dL (ref 1.5–4.5)
Glucose: 101 mg/dL — ABNORMAL HIGH (ref 70–99)
Potassium: 4.8 mmol/L (ref 3.5–5.2)
Sodium: 142 mmol/L (ref 134–144)
Total Protein: 7.9 g/dL (ref 6.0–8.5)
eGFR: 86 mL/min/{1.73_m2} (ref >59)

## 2021-10-21 LAB — HEMOGLOBIN A1C
Est. average glucose Bld gHb Est-mCnc: 120 mg/dL
Hgb A1c MFr Bld: 5.8 % — ABNORMAL HIGH (ref 4.8–5.6)

## 2021-10-21 LAB — LIPID PANEL
Chol/HDL Ratio: 4.8 ratio (ref 0.0–5.0)
Cholesterol, Total: 221 mg/dL — ABNORMAL HIGH (ref 100–199)
HDL: 46 mg/dL (ref >39)
LDL Chol Calc (NIH): 139 mg/dL — ABNORMAL HIGH (ref 0–99)
Triglycerides: 202 mg/dL — ABNORMAL HIGH (ref 0–149)
VLDL Cholesterol Cal: 36 mg/dL (ref 5–40)

## 2021-10-26 ENCOUNTER — Other Ambulatory Visit: Payer: Self-pay | Admitting: Family Medicine

## 2021-10-26 DIAGNOSIS — I10 Essential (primary) hypertension: Secondary | ICD-10-CM

## 2021-11-05 ENCOUNTER — Encounter: Payer: Self-pay | Admitting: Gastroenterology

## 2021-11-10 ENCOUNTER — Encounter: Payer: Self-pay | Admitting: Gastroenterology

## 2021-11-10 ENCOUNTER — Ambulatory Visit: Payer: Managed Care, Other (non HMO) | Admitting: Anesthesiology

## 2021-11-10 ENCOUNTER — Other Ambulatory Visit: Payer: Self-pay

## 2021-11-10 ENCOUNTER — Encounter
Admission: RE | Disposition: A | Discharge status: Home / Self Care | Payer: Self-pay | Source: Home / Self Care | Attending: Gastroenterology

## 2021-11-10 ENCOUNTER — Ambulatory Visit
Admission: RE | Admit: 2021-11-10 | Discharge: 2021-11-10 | Disposition: A | Discharge status: Home / Self Care | Payer: Managed Care, Other (non HMO) | Attending: Gastroenterology | Admitting: Gastroenterology

## 2021-11-10 DIAGNOSIS — K635 Polyp of colon: Secondary | ICD-10-CM | POA: Diagnosis not present

## 2021-11-10 DIAGNOSIS — D122 Benign neoplasm of ascending colon: Secondary | ICD-10-CM | POA: Diagnosis not present

## 2021-11-10 DIAGNOSIS — D124 Benign neoplasm of descending colon: Secondary | ICD-10-CM | POA: Insufficient documentation

## 2021-11-10 DIAGNOSIS — D125 Benign neoplasm of sigmoid colon: Secondary | ICD-10-CM | POA: Diagnosis not present

## 2021-11-10 DIAGNOSIS — D123 Benign neoplasm of transverse colon: Secondary | ICD-10-CM | POA: Diagnosis not present

## 2021-11-10 DIAGNOSIS — I10 Essential (primary) hypertension: Secondary | ICD-10-CM | POA: Insufficient documentation

## 2021-11-10 DIAGNOSIS — K219 Gastro-esophageal reflux disease without esophagitis: Secondary | ICD-10-CM | POA: Insufficient documentation

## 2021-11-10 DIAGNOSIS — G4730 Sleep apnea, unspecified: Secondary | ICD-10-CM | POA: Diagnosis not present

## 2021-11-10 DIAGNOSIS — K5730 Diverticulosis of large intestine without perforation or abscess without bleeding: Secondary | ICD-10-CM | POA: Diagnosis not present

## 2021-11-10 DIAGNOSIS — K621 Rectal polyp: Secondary | ICD-10-CM | POA: Diagnosis not present

## 2021-11-10 DIAGNOSIS — Z1211 Encounter for screening for malignant neoplasm of colon: Secondary | ICD-10-CM

## 2021-11-10 HISTORY — DX: Obesity, unspecified: E66.9

## 2021-11-10 HISTORY — PX: COLONOSCOPY WITH PROPOFOL: SHX5780

## 2021-11-10 HISTORY — DX: Gastro-esophageal reflux disease without esophagitis: K21.9

## 2021-11-10 HISTORY — PX: POLYPECTOMY: SHX5525

## 2021-11-10 HISTORY — DX: Sleep apnea, unspecified: G47.30

## 2021-11-10 HISTORY — DX: Gout, unspecified: M10.9

## 2021-11-10 SURGERY — COLONOSCOPY WITH PROPOFOL
Anesthesia: General | Site: Rectum

## 2021-11-10 MED ORDER — LIDOCAINE HCL (CARDIAC) PF 100 MG/5ML IV SOSY
PREFILLED_SYRINGE | INTRAVENOUS | Status: DC | PRN
Start: 2021-11-10 — End: 2021-11-10
  Administered 2021-11-10: 30 mg via INTRAVENOUS

## 2021-11-10 MED ORDER — ONDANSETRON HCL 4 MG/2ML IJ SOLN: 4.0000 mg | Freq: Once | INTRAMUSCULAR | Status: DC | PRN

## 2021-11-10 MED ORDER — PROPOFOL 10 MG/ML IV BOLUS
INTRAVENOUS | Status: DC | PRN
  Administered 2021-11-10 (×2): 40 mg via INTRAVENOUS
  Administered 2021-11-10: 50 mg via INTRAVENOUS
  Administered 2021-11-10: 40 mg via INTRAVENOUS
  Administered 2021-11-10: 50 mg via INTRAVENOUS
  Administered 2021-11-10: 40 mg via INTRAVENOUS
  Administered 2021-11-10 (×3): 50 mg via INTRAVENOUS
  Administered 2021-11-10: 40 mg via INTRAVENOUS
  Administered 2021-11-10: 150 mg via INTRAVENOUS
  Administered 2021-11-10: 30 mg via INTRAVENOUS
  Administered 2021-11-10: 20 mg via INTRAVENOUS
  Administered 2021-11-10 (×2): 40 mg via INTRAVENOUS
  Administered 2021-11-10: 50 mg via INTRAVENOUS

## 2021-11-10 MED ORDER — ACETAMINOPHEN 160 MG/5ML PO SOLN: 975.0000 mg | Freq: Once | ORAL | Status: DC | PRN

## 2021-11-10 MED ORDER — SODIUM CHLORIDE 0.9 % IV SOLN: INTRAVENOUS | Status: DC

## 2021-11-10 MED ORDER — LACTATED RINGERS IV SOLN: INTRAVENOUS | Status: DC

## 2021-11-10 MED ORDER — ACETAMINOPHEN 500 MG PO TABS: 1000.0000 mg | ORAL_TABLET | Freq: Once | ORAL | Status: DC | PRN

## 2021-11-10 MED ORDER — STERILE WATER FOR IRRIGATION IR SOLN
Status: DC | PRN
  Administered 2021-11-10: 1

## 2021-11-10 SURGICAL SUPPLY — 22 items
CLIP HMST 235XBRD CATH ROT (MISCELLANEOUS) IMPLANT
CLIP RESOLUTION 360 11X235 (MISCELLANEOUS) ×2
ELECT REM PT RETURN 9FT ADLT (ELECTROSURGICAL)
ELECTRODE REM PT RTRN 9FT ADLT (ELECTROSURGICAL) IMPLANT
FORCEPS BIOP RAD 4 LRG CAP 4 (CUTTING FORCEPS) ×1 IMPLANT
GOWN CVR UNV OPN BCK APRN NK (MISCELLANEOUS) ×2 IMPLANT
GOWN ISOL THUMB LOOP REG UNIV (MISCELLANEOUS) ×4
INJECTOR VARIJECT VIN23 (MISCELLANEOUS) IMPLANT
KIT DEFENDO VALVE AND CONN (KITS) IMPLANT
KIT PRC NS LF DISP ENDO (KITS) ×1 IMPLANT
KIT PROCEDURE OLYMPUS (KITS) ×2
MANIFOLD NEPTUNE II (INSTRUMENTS) ×2 IMPLANT
MARKER SPOT ENDO TATTOO 5ML (MISCELLANEOUS) IMPLANT
PROBE APC STR FIRE (PROBE) IMPLANT
RETRIEVER NET ROTH 2.5X230 LF (MISCELLANEOUS) IMPLANT
SNARE COLD EXACTO (MISCELLANEOUS) ×1 IMPLANT
SNARE SHORT THROW 13M SML OVAL (MISCELLANEOUS) IMPLANT
SNARE SNG USE RND 15MM (INSTRUMENTS) IMPLANT
SPOT EX ENDOSCOPIC TATTOO (MISCELLANEOUS)
TRAP ETRAP POLY (MISCELLANEOUS) ×1 IMPLANT
VARIJECT INJECTOR VIN23 (MISCELLANEOUS)
WATER STERILE IRR 250ML POUR (IV SOLUTION) ×2 IMPLANT

## 2021-11-10 NOTE — Anesthesia Postprocedure Evaluation (Signed)
Anesthesia Post Note ? ?Patient: Jared Watkins ? ?Procedure(s) Performed: COLONOSCOPY WITH PROPOFOL (Rectum) ?POLYPECTOMY (Rectum) ? ? ?  ?Patient location during evaluation: PACU ?Anesthesia Type: General ?Level of consciousness: awake and alert ?Pain management: pain level controlled ?Vital Signs Assessment: post-procedure vital signs reviewed and stable ?Respiratory status: spontaneous breathing, nonlabored ventilation and respiratory function stable ?Cardiovascular status: blood pressure returned to baseline and stable ?Postop Assessment: no apparent nausea or vomiting ?Anesthetic complications: no ? ? ?No notable events documented. ? ?[name] ? ? ? ? ? ?
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
## 2021-11-10 NOTE — Anesthesia Preprocedure Evaluation (Addendum)
Anesthesia Evaluation  ?Patient identified by MRN, date of birth, ID band ?Patient awake ? ? ? ?Reviewed: ?Allergy & Precautions, H&P , NPO status , Patient's Chart, lab work & pertinent test results, reviewed documented beta blocker date and time  ? ?Airway ?Mallampati: III ? ?TM Distance: >3 FB ?Neck ROM: full ? ? ? Dental ?no notable dental hx. ? ?  ?Pulmonary ?sleep apnea ,  ?  ?Pulmonary exam normal ?breath sounds clear to auscultation ? ? ? ? ? ? Cardiovascular ?Exercise Tolerance: Good ?hypertension, negative cardio ROS ? ? ?Rhythm:regular Rate:Normal ? ? ?  ?Neuro/Psych ?negative neurological ROS ? negative psych ROS  ? GI/Hepatic ?negative GI ROS, Neg liver ROS, GERD  ,  ?Endo/Other  ?negative endocrine ROS ? Renal/GU ?negative Renal ROS  ?negative genitourinary ?  ?Musculoskeletal ? ? Abdominal ?  ?Peds ? Hematology ?negative hematology ROS ?(+)   ?Anesthesia Other Findings ?BMI 44 ? Reproductive/Obstetrics ?negative OB ROS ? ?  ? ? ? ? ? ? ? ? ? ? ? ? ? ?  ?  ? ? ? ? ? ? ?Anesthesia Physical ?Anesthesia Plan ? ?ASA: 3 ? ?Anesthesia Plan: General  ? ?Post-op Pain Management:   ? ?Induction:  ? ?PONV Risk Score and Plan: 2 and TIVA, Propofol infusion and Treatment may vary due to age or medical condition ? ?Airway Management Planned:  ? ?Additional Equipment:  ? ?Intra-op Plan:  ? ?Post-operative Plan:  ? ?Informed Consent: I have reviewed the patients History and Physical, chart, labs and discussed the procedure including the risks, benefits and alternatives for the proposed anesthesia with the patient or authorized representative who has indicated his/her understanding and acceptance.  ? ? ? ?Dental Advisory Given ? ?Plan Discussed with: CRNA ? ?Anesthesia Plan Comments:   ? ? ? ? ? ?Anesthesia Quick Evaluation ? ?

## 2021-11-10 NOTE — Transfer of Care (Signed)
Immediate Anesthesia Transfer of Care Note ? ?Patient: Jared Watkins ? ?Procedure(s) Performed: COLONOSCOPY WITH PROPOFOL (Rectum) ?POLYPECTOMY (Rectum) ? ?Patient Location: PACU ? ?Anesthesia Type: General ? ?Level of Consciousness: awake, alert  and patient cooperative ? ?Airway and Oxygen Therapy: Patient Spontanous Breathing and Patient connected to supplemental oxygen ? ?Post-op Assessment: Post-op Vital signs reviewed, Patient's Cardiovascular Status Stable, Respiratory Function Stable, Patent Airway and No signs of Nausea or vomiting ? ?Post-op Vital Signs: Reviewed and stable ? ?Complications: No notable events documented. ? ?

## 2021-11-10 NOTE — Op Note (Signed)
[facility] ?Gastroenterology ?Patient Name: Jared Watkins ?Procedure Date: 11/10/2021 9:47 AM ?MRN: [id number] ?Account #: [id number] ?Date of Birth: [birth year] ?Admit Type: Outpatient ?Age: 46 ?Room: [facility] OR ROOM 01 ?Gender: Male ?Note Status: Finalized ?Instrument Name: [id number] ?Procedure:             Colonoscopy ?Indications:           Screening for colorectal malignant neoplasm ?Providers:             Lucilla Lame MD, MD ?Referring MD:          Kirstie Peri. Caryn Section, MD (Referring MD) ?Medicines:             Propofol per Anesthesia ?Complications:         No immediate complications. ?Procedure:             Pre-Anesthesia Assessment: ?                       - Prior to the procedure, a History and Physical was  ?                       performed, and patient medications and allergies were  ?                       reviewed. The patient's tolerance of previous  ?                       anesthesia was also reviewed. The risks and benefits  ?                       of the procedure and the sedation options and risks  ?                       were discussed with the patient. All questions were  ?                       answered, and informed consent was obtained. Prior  ?                       Anticoagulants: The patient has taken no previous  ?                       anticoagulant or antiplatelet agents. ASA Grade  ?                       Assessment: II - A patient with mild systemic disease.  ?                       After reviewing the risks and benefits, the patient  ?                       was deemed in satisfactory condition to undergo the  ?                       procedure. ?                       After obtaining informed consent, the colonoscope was  ?  passed under direct vision. Throughout the procedure,  ?                       the patient's blood pressure, pulse, and oxygen  ?                       saturations were monitored continuously. The  ?                       Colonoscope was  introduced through the anus and  ?                       advanced to the the cecum, identified by appendiceal  ?                       orifice and ileocecal valve. The colonoscopy was  ?                       performed without difficulty. The patient tolerated  ?                       the procedure well. The quality of the bowel  ?                       preparation was excellent. ?Findings: ?     The perianal and digital rectal examinations were normal. ?     Two sessile polyps were found in the ascending colon. The polyps were 2  ?     to 3 mm in size. These polyps were removed with a cold biopsy forceps.  ?     Resection and retrieval were complete. ?     A 5 mm polyp was found in the transverse colon. The polyp was sessile.  ?     The polyp was removed with a cold snare. Resection and retrieval were  ?     complete. ?     Eight sessile polyps were found in the descending colon. The polyps were  ?     4 to 9 mm in size. These polyps were removed with a cold snare.  ?     Resection and retrieval were complete. ?     A 10 mm polyp was found in the sigmoid colon. The polyp was sessile. The  ?     polyp was removed with a cold snare. Resection and retrieval were  ?     complete. To prevent bleeding post-intervention, one hemostatic clip was  ?     successfully placed (MR conditional). There was no bleeding at the end  ?     of the procedure. ?     Two sessile polyps were found in the rectum. The polyps were 3 to 5 mm  ?     in size. These polyps were removed with a cold snare. Resection and  ?     retrieval were complete. ?     Multiple small-mouthed diverticula were found in the sigmoid colon. ?Impression:            - Two 2 to 3 mm polyps in the ascending colon, removed  ?                       with a cold biopsy forceps. Resected and  retrieved. ?                       - One 5 mm polyp in the transverse colon, removed with  ?                       a cold snare. Resected and retrieved. ?                       - Eight  4 to 9 mm polyps in the descending colon,  ?                       removed with a cold snare. Resected and retrieved. ?                       - One 10 mm polyp in the sigmoid colon, removed with a  ?                       cold snare. Resected and retrieved. Clip (MR  ?                       conditional) was placed. ?                       - Two 3 to 5 mm polyps in the rectum, removed with a  ?                       cold snare. Resected and retrieved. ?                       - Diverticulosis in the sigmoid colon. ?Recommendation:        - Discharge patient to home. ?                       - Resume previous diet. ?                       - Continue present medications. ?                       - Await pathology results. ?                       - Repeat colonoscopy in 1 year for surveillance. ?Procedure Code(s):     --- Professional --- ?                       [phone], Colonoscopy, flexible; with removal of  ?                       tumor(s), polyp(s), or other lesion(s) by snare  ?                       technique ?                       45380, 59, Colonoscopy, flexible; with biopsy, single  ?                       or multiple ?Diagnosis Code(s):     ---  Professional --- ?                       Z12.11, Encounter for screening for malignant neoplasm  ?                       of colon ?                       K63.5, Polyp of colon ?                       K62.1, Rectal polyp ?CPT copyright 2019 American Medical Association. All rights reserved. ?The codes documented in this report are preliminary and upon coder review may  ?be revised to meet current compliance requirements. ?Lucilla Lame MD, MD ?11/10/2021 10:23:24 AM ?This report has been signed electronically. ?Number of Addenda: 0 ?Note Initiated On: 11/10/2021 9:47 AM ?Scope Withdrawal Time: 0 hours 19 minutes 52 seconds  ?Total Procedure Duration: 0 hours 22 minutes 52 seconds  ?Estimated Blood Loss:  Estimated blood loss: none. ?     [facility] ?

## 2021-11-10 NOTE — Anesthesia Procedure Notes (Signed)
Date/Time: 11/10/2021 9:53 AM ?Performed by: Cameron Ali, CRNA ?Pre-anesthesia Checklist: Patient identified, Emergency Drugs available, Suction available, Timeout performed and Patient being monitored ?Patient Re-evaluated:Patient Re-evaluated prior to induction ?Oxygen Delivery Method: Nasal cannula ?Placement Confirmation: positive ETCO2 ? ? ? ? ?

## 2021-11-11 ENCOUNTER — Encounter: Payer: Self-pay | Admitting: Gastroenterology

## 2021-11-11 LAB — SURGICAL PATHOLOGY

## 2021-11-13 ENCOUNTER — Encounter: Payer: Self-pay | Admitting: Gastroenterology

## 2021-11-14 ENCOUNTER — Encounter: Payer: Self-pay | Admitting: Family Medicine

## 2021-11-14 DIAGNOSIS — Z860101 Personal history of adenomatous and serrated colon polyps: Secondary | ICD-10-CM | POA: Insufficient documentation

## 2021-11-14 DIAGNOSIS — Z86010 Personal history of colonic polyps: Secondary | ICD-10-CM | POA: Insufficient documentation

## 2021-11-25 ENCOUNTER — Ambulatory Visit: Payer: Managed Care, Other (non HMO) | Admitting: Family Medicine

## 2021-11-25 ENCOUNTER — Encounter: Payer: Self-pay | Admitting: Family Medicine

## 2021-11-25 ENCOUNTER — Ambulatory Visit: Payer: Self-pay | Admitting: *Deleted

## 2021-11-25 VITALS — BP 124/92 | HR 108 | Temp 98.8°F | Resp 16 | Wt 344.3 lb

## 2021-11-25 DIAGNOSIS — K529 Noninfective gastroenteritis and colitis, unspecified: Secondary | ICD-10-CM | POA: Diagnosis not present

## 2021-11-25 MED ORDER — PROMETHAZINE HCL 25 MG PO TABS: 25.0000 mg | ORAL_TABLET | Freq: Three times a day (TID) | ORAL | 0 refills | Status: DC | PRN

## 2021-11-25 MED ORDER — METRONIDAZOLE 500 MG PO TABS: 500.0000 mg | ORAL_TABLET | Freq: Three times a day (TID) | ORAL | 0 refills | Status: AC

## 2021-11-25 NOTE — Telephone Encounter (Signed)
Summary: Patient vomiting and diarrhea, abdominal pains  ? Patient wife called in stated that his diverticulitis was acting up last night he had diarrhea, vomiting abdominal pains and just was not able to rest asking if Dr Caryn Section will call him in something for the pain and to comfort him. Wife Karena Addison can be reached at Ph#  [phone]   ?  ? ?Reason for Disposition ? [1] MILD pain (e.g., does not interfere with normal activities) AND [2] pain comes and goes (cramps) [3] present > 48 hours  (Exception: this same abdominal pain is a chronic symptom recurrent or ongoing AND present > 4 weeks) ? ?Answer Assessment - Initial Assessment Questions ?1. LOCATION: "Where does it hurt?"  ?    Gurgling stomach- sulfa taste, horrible smell to stool- h pylori history ?2. RADIATION: "Does the pain shoot anywhere else?" (e.g., chest, back) ?    Not pain ?3. ONSET: "When did the pain begin?" (Minutes, hours or days ago)  ?    All night ?4. SUDDEN: "Gradual or sudden onset?" ?    Sudden- full feeling- then nausea ?5. PATTERN "Does the pain come and go, or is it constant?" ?   - If constant: "Is it getting better, staying the same, or worsening?"  ?    (Note: Constant means the pain never goes away completely; most serious pain is constant and it progresses)  ?   - If intermittent: "How long does it last?" "Do you have pain now?" ?    (Note: Intermittent means the pain goes away completely between bouts) ?    No ?6. SEVERITY: "How bad is the pain?"  (e.g., Scale 1-10; mild, moderate, or severe) ?   - MILD (1-3): doesn't interfere with normal activities, abdomen soft and not tender to touch  ?   - MODERATE (4-7): interferes with normal activities or awakens from sleep, abdomen tender to touch  ?   - SEVERE (8-10): excruciating pain, doubled over, unable to do any normal activities   ?    Mild- crampy ?7. RECURRENT SYMPTOM: "Have you ever had this type of stomach pain before?" If Yes, ask: "When was the last time?" and "What happened that  time?"  ?    Diverticulitis in the past ?8. CAUSE: "What do you think is causing the stomach pain?" ?    unsure ?9. RELIEVING/AGGRAVATING FACTORS: "What makes it better or worse?" (e.g., movement, antacids, bowel movement) ?    Pepto calmed symptoms ?10. OTHER SYMPTOMS: "Do you have any other symptoms?" (e.g., back pain, diarrhea, fever, urination pain, vomiting) ?      Vomiting, diarrhea ? ?Protocols used: Abdominal Pain - Male-A-AH ? ?

## 2021-11-25 NOTE — Progress Notes (Signed)
?  ? ?I,Jana Robinson,acting as a scribe for Lelon Huh, MD.,have documented all relevant documentation on the behalf of Lelon Huh, MD,as directed by  Lelon Huh, MD while in the presence of Lelon Huh, MD. ? ? ?Established patient visit ? ? ?Patient: Jared Watkins   DOB: [birth year]   46 y.o. Male  MRN: [id number] ?Visit Date: 11/25/2021 ? ?Today's healthcare provider: Lelon Huh, MD  ? ?Chief Complaint  ?Patient presents with  ? Diarrhea  ? Emesis  ? ?Subjective  ?  ?Patient present for nausea, vomiting and diarrhea  ? ?Patient reports onset of symptoms yesterday of abdominal cramping, gurgling stomach, sulfa taste and horrible smelling stool. Reports a sudden onset with a full feeling and then nausea. Hx of diverticulitis. He denies having any focal abdominal pain, but he states it feels like his previous episodes of diverticulitis.  Pepto calmed symptoms. Had some dry heaving this morning.  ? ? ? ?Medications: ?Outpatient Medications Prior to Visit  ?Medication Sig  ? allopurinol (ZYLOPRIM) 100 MG tablet Take 1 tablet (100 mg total) by mouth daily.  ? amLODipine (NORVASC) 10 MG tablet Take 1 tablet (10 mg total) by mouth daily.  ? colchicine (COLCRYS) 0.6 MG tablet TAKE 2 TABLETS BY MOUTH TODAY, THEN 1 TABLET DAILY UNTIL SYMPTOMS IMPROVED  ? fluticasone (FLONASE) 50 MCG/ACT nasal spray Place 2 sprays into both nostrils daily.  ? lisinopril-hydrochlorothiazide (ZESTORETIC) 10-12.5 MG tablet TAKE 1 TABLET BY MOUTH DAILY  ? LORazepam (ATIVAN) 0.5 MG tablet TAKE 1 TABLET BY MOUTH TWICE DAILY AS NEEDED FOR ANXIETY  ? metoprolol succinate (TOPROL-XL) 25 MG 24 hr tablet TAKE 1/2 TABLET(12.5 MG) BY MOUTH DAILY  ? omeprazole (PRILOSEC) 20 MG capsule Take 1 capsule (20 mg total) by mouth daily.  ? Semaglutide-Weight Management (WEGOVY) 2.4 MG/0.75ML SOAJ Inject 2.4 mg into the skin once a week.  ? sertraline (ZOLOFT) 100 MG tablet Take 1 tablet (100 mg total) by mouth daily.  ? albuterol (VENTOLIN HFA) 108  (90 Base) MCG/ACT inhaler INHALE 2 PUFFS INTO THE LUNGS EVERY 6 HOURS AS NEEDED FOR WHEEZING OR SHORTNESS OF BREATH (Patient not taking: Reported on 11/05/2021)  ? ?No facility-administered medications prior to visit.  ? ? ?Review of Systems  ?Gastrointestinal:  Positive for vomiting.  ? ? ?  Objective  ?  ?BP (!) 124/92 (BP Location: Right Arm, Patient Position: Sitting, Cuff Size: Large)   Pulse (!) 108   Temp 98.8 ?F (37.1 ?C) (Oral)   Resp 16   Wt (!) 344 lb 4.8 oz (156.2 kg)   SpO2 93%   BMI 44.21 kg/m?  ? ? ?Physical Exam  ? ?General Appearance:    Severely obese male, alert, cooperative, in no acute distress  ?Eyes:    PERRL, conjunctiva/corneas clear, EOM's intact       ?Lungs:     Clear to auscultation bilaterally, respirations unlabored  ?Heart:    Tachycardic. Normal rhythm. No murmurs, rubs, or gallops.    ?Abdomen:   bowel sounds present and normal in all 4 quadrants, soft, round, or nontender. No CVA tenderness   ?   ?  ? Assessment & Plan  ?  ? ?1. Gastroenteritis ? ?- metroNIDAZOLE (FLAGYL) 500 MG tablet; Take 1 tablet (500 mg total) by mouth 3 (three) times daily for 10 days.  Dispense: 30 tablet; Refill: 0 ?- promethazine (PHENERGAN) 25 MG tablet; Take 1 tablet (25 mg total) by mouth every 8 (eight) hours as needed for nausea or vomiting.  Dispense: 10 tablet; Refill: 0  ? ?Call if symptoms change or if not rapidly improving.  ?  ?   ? ?The entirety of the information documented in the History of Present Illness, Review of Systems and Physical Exam were personally obtained by me. Portions of this information were initially documented by the CMA and reviewed by me for thoroughness and accuracy.   ? ? ?Lelon Huh, MD  ?[facility] ?[phone] (phone) ?[phone] (fax) ? ?[facility] ?

## 2021-11-25 NOTE — Telephone Encounter (Signed)
?  Chief Complaint: abdominal cramping, diarrhea, vomiting ?Symptoms: abdominal discomfort with eating, diarrhea, vomiting ?Frequency: started yesterday- up all night ?Pertinent Negatives: Patient denies fever, urinary symptoms ?Disposition: '[]'$ ED /'[]'$ Urgent Care (no appt availability in office) / '[x]'$ Appointment(In office/virtual)/ '[]'$  [place] [facility]/ '[]'$ Home Care/ '[]'$ Refused Recommended Disposition /'[]'$ [place] [facility]/ '[]'$  Follow-up with PCP ?Additional Notes:    ?

## 2021-12-01 ENCOUNTER — Ambulatory Visit: Payer: Managed Care, Other (non HMO) | Admitting: Family Medicine

## 2021-12-01 NOTE — Progress Notes (Deleted)
I,Jana Shakeira Rhee,acting as a scribe for Lelon Huh, MD.,have documented all relevant documentation on the behalf of Lelon Huh, MD,as directed by  Lelon Huh, MD while in the presence of Lelon Huh, MD.   Established patient visit   Patient: Jared Watkins   DOB: 1975/08/20   46 y.o. Male  MRN: [id number] Visit Date: 12/01/2021  Today's healthcare provider: Lelon Huh, MD   No chief complaint on file.  Subjective    HPI   Follow up for Gastroenteritis and BP check  The patient was last seen for this 1 weeks ago. Changes made at last visit include metronidazole 500 mg. 1 tablet 3 times daily for 10 days. Promethazine 25 mg tablet every 8 hours as needed for nausea or vomiting.  He reports {excellent/good/fair/poor:19665} compliance with treatment. He feels that condition is {improved/worse/unchanged:3041574}. He {is/is not:21021397} having side effects. ***  -----------------------------------------------------------------------------------------    Pertinent labs Lab Results  Component Value Date   CHOL 221 (H) 10/20/2021   HDL 46 10/20/2021   LDLCALC 139 (H) 10/20/2021   LDLDIRECT 99.0 04/06/2016   TRIG 202 (H) 10/20/2021   CHOLHDL 4.8 10/20/2021   Lab Results  Component Value Date   NA 142 10/20/2021   K 4.8 10/20/2021   CREATININE 1.08 10/20/2021   EGFR 86 10/20/2021   GLUCOSE 101 (H) 10/20/2021   TSH 1.190 05/29/2020     The 10-year ASCVD risk score (Arnett DK, et al., 2019) is: 2.9%  ---------------------------------------------------------------------------------------------------   Medications: Outpatient Medications Prior to Visit  Medication Sig   albuterol (VENTOLIN HFA) 108 (90 Base) MCG/ACT inhaler INHALE 2 PUFFS INTO THE LUNGS EVERY 6 HOURS AS NEEDED FOR WHEEZING OR SHORTNESS OF BREATH (Patient not taking: Reported on 11/05/2021)   allopurinol (ZYLOPRIM) 100 MG tablet Take 1 tablet (100 mg total) by mouth daily.   amLODipine  (NORVASC) 10 MG tablet Take 1 tablet (10 mg total) by mouth daily.   colchicine (COLCRYS) 0.6 MG tablet TAKE 2 TABLETS BY MOUTH TODAY, THEN 1 TABLET DAILY UNTIL SYMPTOMS IMPROVED   fluticasone (FLONASE) 50 MCG/ACT nasal spray Place 2 sprays into both nostrils daily.   lisinopril-hydrochlorothiazide (ZESTORETIC) 10-12.5 MG tablet TAKE 1 TABLET BY MOUTH DAILY   LORazepam (ATIVAN) 0.5 MG tablet TAKE 1 TABLET BY MOUTH TWICE DAILY AS NEEDED FOR ANXIETY   metoprolol succinate (TOPROL-XL) 25 MG 24 hr tablet TAKE 1/2 TABLET(12.5 MG) BY MOUTH DAILY   metroNIDAZOLE (FLAGYL) 500 MG tablet Take 1 tablet (500 mg total) by mouth 3 (three) times daily for 10 days.   omeprazole (PRILOSEC) 20 MG capsule Take 1 capsule (20 mg total) by mouth daily.   promethazine (PHENERGAN) 25 MG tablet Take 1 tablet (25 mg total) by mouth every 8 (eight) hours as needed for nausea or vomiting.   Semaglutide-Weight Management (WEGOVY) 2.4 MG/0.75ML SOAJ Inject 2.4 mg into the skin once a week.   sertraline (ZOLOFT) 100 MG tablet Take 1 tablet (100 mg total) by mouth daily.   No facility-administered medications prior to visit.    Review of Systems  {Labs  Heme  Chem  Endocrine  Serology  Results Review (optional):23779}   Objective    There were no vitals taken for this visit. {Show previous vital signs (optional):23777}  Physical Exam  ***  No results found for any visits on 12/01/21.  Assessment & Plan     ***  No follow-ups on file.      {provider attestation***:1}   Lelon Huh, MD  [place] [phone] (phone) [phone] (fax)  [place]

## 2021-12-20 ENCOUNTER — Other Ambulatory Visit: Payer: Self-pay | Admitting: Family Medicine

## 2021-12-20 DIAGNOSIS — F419 Anxiety disorder, unspecified: Secondary | ICD-10-CM

## 2021-12-20 DIAGNOSIS — F410 Panic disorder [episodic paroxysmal anxiety] without agoraphobia: Secondary | ICD-10-CM

## 2022-01-14 ENCOUNTER — Other Ambulatory Visit: Payer: Self-pay | Admitting: Family Medicine

## 2022-01-14 DIAGNOSIS — J40 Bronchitis, not specified as acute or chronic: Secondary | ICD-10-CM

## 2022-02-16 ENCOUNTER — Other Ambulatory Visit: Payer: Self-pay | Admitting: Family Medicine

## 2022-02-16 DIAGNOSIS — M109 Gout, unspecified: Secondary | ICD-10-CM

## 2022-02-16 DIAGNOSIS — I10 Essential (primary) hypertension: Secondary | ICD-10-CM

## 2022-03-10 ENCOUNTER — Ambulatory Visit: Payer: Self-pay | Admitting: *Deleted

## 2022-03-10 ENCOUNTER — Other Ambulatory Visit: Payer: Self-pay | Admitting: Family Medicine

## 2022-03-10 DIAGNOSIS — F419 Anxiety disorder, unspecified: Secondary | ICD-10-CM

## 2022-03-10 NOTE — Telephone Encounter (Signed)
Medication Refill - Medication: LORazepam (ATIVAN) 0.5 MG tablet  Has the patient contacted their pharmacy? Yes.   No, more refills.    (Agent: If yes, when and what did the pharmacy advise?)  Preferred Pharmacy (with phone number or street name):  [id number] [address], [place]  [place] [address]  Phone: [phone] Fax: [phone]  Hours: Not open 24 hours   Has the patient been seen for an appointment in the last year OR does the patient have an upcoming appointment? Yes.    Agent: Please be advised that RX refills may take up to 3 business days. We ask that you follow-up with your pharmacy.

## 2022-03-10 NOTE — Telephone Encounter (Signed)
  Chief Complaint: medication problem Symptoms: joint pain, fatigue  Frequency: ongoing for weeks if not months Pertinent Negatives: NA Disposition: '[]'$ ED /'[]'$ Urgent Care (no appt availability in office) / '[x]'$ Appointment(In office/virtual)/ '[]'$  [place] [facility]/ '[]'$ Home Care/ '[]'$ Refused Recommended Disposition /'[]'$ [place] [facility]/ '[]'$  Follow-up with PCP Additional Notes: pt is wanting to take a different medication for BP d/t amlodipine causing joint pain and fatigue. Pt says when he takes it causing those symptoms but when dont take it feels fine. Has gotten to where he just dont take it d/t making him feel so bad. Scheduled pt for appt on 03/13/22 at [facility].   Summary: inflammation in joints/Medication Management.   Pt stated has a lot of inflammation in joints and needs different medication for blood pressure. Pt is currently taking medication amLODipine (NORVASC) 10 MG tablet .  Pt stated that when he takes medication, he feels tired, and it's hard to get back off the ground when he bends down. Pt stated when he doesn't take medication, he feels fine.   Pt seeking clinical advice.      Reason for Disposition  Prescription request for new medicine (not a refill)  Answer Assessment - Initial Assessment Questions 1. NAME of MEDICINE: "What medicine(s) are you calling about?"     Amlodipine  2. QUESTION: "What is your question?" (e.g., double dose of medicine, side effect)     Wanting a different medication d/t sx  3. PRESCRIBER: "Who prescribed the medicine?" Reason: if prescribed by specialist, call should be referred to that group.     [name]  4. SYMPTOMS: "Do you have any symptoms?" If Yes, ask: "What symptoms are you having?"  "How bad are the symptoms (e.g., mild, moderate, severe)     Joint pain and fatigue  Protocols used: Medication Question Call-A-AH

## 2022-03-11 NOTE — Telephone Encounter (Signed)
Requested medication (s) are due for refill today- expired Rx  Requested medication (s) are on the active medication list -yes  Future visit scheduled -yes  Last refill: 02/07/19 #30 5RF  Notes to clinic: non delegated Rx  Requested Prescriptions  Pending Prescriptions Disp Refills   LORazepam (ATIVAN) 0.5 MG tablet 30 tablet 5    Sig: Take 1 tablet (0.5 mg total) by mouth 2 (two) times daily as needed. for anxiety     Not Delegated - Psychiatry: Anxiolytics/Hypnotics 2 Failed - 03/10/2022  1:05 PM      Failed - This refill cannot be delegated      Failed - Urine Drug Screen completed in last 360 days      Passed - Patient is not pregnant      Passed - Valid encounter within last 6 months    Recent Outpatient Visits           3 months ago [place], Donald E, MD   4 months ago Severe obesity (BMI >= 40) ([place])   [facility] Birdie Sons, MD   10 months ago Severe obesity (BMI >= 40) ([place])   [facility] Birdie Sons, MD   1 year ago Primary hypertension   [facility] Birdie Sons, MD   1 year ago [place], MD       Future Appointments             In 2 days [place], Kirstie Peri, MD [facility]               Requested Prescriptions  Pending Prescriptions Disp Refills   LORazepam (ATIVAN) 0.5 MG tablet 30 tablet 5    Sig: Take 1 tablet (0.5 mg total) by mouth 2 (two) times daily as needed. for anxiety     Not Delegated - Psychiatry: Anxiolytics/Hypnotics 2 Failed - 03/10/2022  1:05 PM      Failed - This refill cannot be delegated      Failed - Urine Drug Screen completed in last 360 days      Passed - Patient is not pregnant      Passed - Valid encounter within last 6 months    Recent Outpatient Visits           3 months ago [place], Donald E, MD   4  months ago Severe obesity (BMI >= 40) [facility])   [facility] Birdie Sons, MD   10 months ago Severe obesity (BMI >= 40) [facility])   [facility] Birdie Sons, MD   1 year ago Primary hypertension   [facility] Birdie Sons, MD   1 year ago [place], Kirstie Peri, MD       Future Appointments             In 2 days Fisher, Kirstie Peri, MD [facility], [place]

## 2022-03-12 MED ORDER — LORAZEPAM 0.5 MG PO TABS: 0.5000 mg | ORAL_TABLET | Freq: Two times a day (BID) | ORAL | 5 refills | Status: DC | PRN

## 2022-03-13 ENCOUNTER — Ambulatory Visit (INDEPENDENT_AMBULATORY_CARE_PROVIDER_SITE_OTHER): Payer: Managed Care, Other (non HMO) | Admitting: Family Medicine

## 2022-03-13 ENCOUNTER — Encounter: Payer: Self-pay | Admitting: Family Medicine

## 2022-03-13 DIAGNOSIS — I10 Essential (primary) hypertension: Secondary | ICD-10-CM | POA: Diagnosis not present

## 2022-03-13 DIAGNOSIS — F410 Panic disorder [episodic paroxysmal anxiety] without agoraphobia: Secondary | ICD-10-CM | POA: Diagnosis not present

## 2022-03-13 MED ORDER — LISINOPRIL-HYDROCHLOROTHIAZIDE 10-12.5 MG PO TABS: 2.0000 | ORAL_TABLET | Freq: Every day | ORAL | 3 refills | Status: DC

## 2022-03-13 MED ORDER — ALPRAZOLAM 0.5 MG PO TABS: 0.5000 mg | ORAL_TABLET | Freq: Two times a day (BID) | ORAL | 1 refills | Status: AC | PRN

## 2022-03-13 NOTE — Progress Notes (Signed)
Established patient visit   Patient: Jared Watkins   DOB: 01/12/1976   46 y.o. Male  MRN: [id number] Visit Date: 03/13/2022  Today's healthcare provider: Lelon Huh, MD   Chief Complaint  Patient presents with   Hypertension   Subjective    HPI  Hypertension, follow-up  BP Readings from Last 3 Encounters:  11/25/21 (!) 124/92  11/10/21 130/75  10/20/21 (!) 132/102   Wt Readings from Last 3 Encounters:  11/25/21 (!) 344 lb 4.8 oz (156.2 kg)  11/10/21 (!) 343 lb (155.6 kg)  10/20/21 (!) 350 lb (158.8 kg)     He was last seen for hypertension 5 months ago.  Management since that visit includes; Increased lisinopril-hctz to take 2 tablets daily.  He reports fair compliance with treatment. He recently stopped taking the amlodipine due to muscle aches which have resolve since stopping amlodipine.   He has also cut back to 1 lisinopril-hctz daily due to prescription running low.  He is having side effect: Reports of fatigue everyday and joint pain in his knees.  He is following a Low Sodium diet. He is exercising. He does not smoke.    Pertinent labs Lab Results  Component Value Date   CHOL 221 (H) 10/20/2021   HDL 46 10/20/2021   LDLCALC 139 (H) 10/20/2021   LDLDIRECT 99.0 04/06/2016   TRIG 202 (H) 10/20/2021   CHOLHDL 4.8 10/20/2021   Lab Results  Component Value Date   NA 142 10/20/2021   K 4.8 10/20/2021   CREATININE 1.08 10/20/2021   EGFR 86 10/20/2021   GLUCOSE 101 (H) 10/20/2021   TSH 1.190 05/29/2020     The 10-year ASCVD risk score (Arnett DK, et al., 2019) is: 3.3%  ---------------------------------------------------------------------------------------------------   Medications: Outpatient Medications Prior to Visit  Medication Sig   albuterol (VENTOLIN HFA) 108 (90 Base) MCG/ACT inhaler INHALE 2 PUFFS INTO THE LUNGS EVERY 6 HOURS AS NEEDED FOR WHEEZING OR SHORTNESS OF BREATH   allopurinol (ZYLOPRIM) 100 MG tablet TAKE 1 TABLET(100 MG)  BY MOUTH DAILY   colchicine (COLCRYS) 0.6 MG tablet TAKE 2 TABLETS BY MOUTH TODAY, THEN 1 TABLET DAILY UNTIL SYMPTOMS IMPROVED   fluticasone (FLONASE) 50 MCG/ACT nasal spray Place 2 sprays into both nostrils daily.   lisinopril-hydrochlorothiazide (ZESTORETIC) 10-12.5 MG tablet TAKE 1 TABLET BY MOUTH DAILY (Patient taking differently: Take 0.5 tablets by mouth daily.)   LORazepam (ATIVAN) 0.5 MG tablet Take 1 tablet (0.5 mg total) by mouth 2 (two) times daily as needed. for anxiety   metoprolol succinate (TOPROL-XL) 25 MG 24 hr tablet TAKE 1/2 TABLET(12.5 MG) BY MOUTH DAILY   omeprazole (PRILOSEC) 20 MG capsule Take 1 capsule (20 mg total) by mouth daily.   promethazine (PHENERGAN) 25 MG tablet Take 1 tablet (25 mg total) by mouth every 8 (eight) hours as needed for nausea or vomiting.   Semaglutide-Weight Management (WEGOVY) 2.4 MG/0.75ML SOAJ Inject 2.4 mg into the skin once a week.   sertraline (ZOLOFT) 100 MG tablet TAKE 1 TABLET(100 MG) BY MOUTH DAILY   amLODipine (NORVASC) 10 MG tablet TAKE 1 TABLET(10 MG) BY MOUTH DAILY (Patient not taking: Reported on 03/13/2022)   No facility-administered medications prior to visit.    Review of Systems  Constitutional:  Negative for appetite change, chills and fever.  Respiratory:  Negative for chest tightness, shortness of breath and wheezing.   Cardiovascular:  Negative for chest pain and palpitations.  Gastrointestinal:  Negative for abdominal pain, nausea and vomiting.  Objective    BP (!) 138/96   Pulse 87   Ht _0  (1.88 m)   Wt (!) 340 lb (154.2 kg)   SpO2 100%   BMI 43.65 kg/m    Physical Exam  General appearance: Severely obese male, cooperative and in no acute distress Head: Normocephalic, without obvious abnormality, atraumatic Respiratory: Respirations even and unlabored, normal respiratory rate Extremities: All extremities are intact.  Skin: Skin color, texture, turgor normal. No rashes seen  Psych: Appropriate mood  and affect. Neurologic: Mental status: Alert, oriented to person, place, and time, thought content appropriate.   Assessment & Plan     1. Essential hypertension BP better than expected considering he has stopped amlodipine due to side effects, and is currently only taking 1 lisinopril-hctz daily. Will go back up to  lisinopril-hydrochlorothiazide (ZESTORETIC) 10-12.5 MG tablet; Take 2 tablets by mouth daily.  Dispense: 180 tablet; Refill: 3  Follow up 6 weeks for labs and BP check.   2. Panic attacks Rarely takes alprazolam, but remains very effective.  refill- ALPRAZolam (XANAX) 0.5 MG tablet; Take 1 tablet (0.5 mg total) by mouth 2 (two) times daily as needed for sleep or anxiety.  Dispense: 60 tablet; Refill: 1      The entirety of the information documented in the History of Present Illness, Review of Systems and Physical Exam were personally obtained by me. Portions of this information were initially documented by the CMA and reviewed by me for thoroughness and accuracy.     Lelon Huh, MD  [facility] [phone] (phone) [phone] (fax)  [place]

## 2022-03-23 ENCOUNTER — Telehealth: Payer: Self-pay | Admitting: Family Medicine

## 2022-03-23 NOTE — Telephone Encounter (Signed)
Pt called saying he thinks the [facility] needs to be increased.  He said he is getting more Hungry between doses.  CB@ [phone]

## 2022-03-23 NOTE — Telephone Encounter (Signed)
2.'4mg'$  is the highest dose.

## 2022-03-25 NOTE — Telephone Encounter (Signed)
Patient advised.

## 2022-03-28 ENCOUNTER — Other Ambulatory Visit: Payer: Self-pay | Admitting: Family Medicine

## 2022-04-09 ENCOUNTER — Telehealth: Payer: Self-pay | Admitting: Family Medicine

## 2022-04-09 DIAGNOSIS — I10 Essential (primary) hypertension: Secondary | ICD-10-CM

## 2022-04-09 NOTE — Telephone Encounter (Signed)
Pt called asking that the prescription for Lisinopril be changed to two pills a day at the local pharmacy.  [facility] [address] [place]

## 2022-04-16 ENCOUNTER — Ambulatory Visit: Payer: Self-pay

## 2022-04-16 MED ORDER — LISINOPRIL-HYDROCHLOROTHIAZIDE 10-12.5 MG PO TABS: 2.0000 | ORAL_TABLET | Freq: Every day | ORAL | 3 refills | Status: DC

## 2022-04-16 NOTE — Telephone Encounter (Signed)
I called and spoke with patient. He is no longer using mail order. He request that we send refills to [facility] [address].

## 2022-04-16 NOTE — Telephone Encounter (Signed)
  Chief Complaint: diverticulitis flare up Symptoms: vomiting, diarrhea, abdominal pain Frequency: started today but has gotten worse, pt had to leave job site and staying overnight in motel d/t feeling sick Pertinent Negatives: NA Disposition: '[]'$ ED /'[]'$ Urgent Care (no appt availability in office) / '[x]'$ Appointment(In office/virtual)/ '[]'$  [facility]/ '[]'$ Home Care/ '[]'$ Refused Recommended Disposition /'[]'$ [place] [facility]/ '[x]'$  Follow-up with PCP Additional Notes: spoke with pt's wife. She is asking for refill of medications of Augmentin and phenergan for diverticulitis flare up. Pt is currently in [place] and unable to drive home this evening d/t feeling sick. I advised her for those medications pt would either have to be seen or a message just sent back to provider which wouldn't be reviewed until tomorrow. I offered to scheduled virtual UC visit but pt doesn't have access to My[facility] account and wouldn't go to local UC d/t being out of network for insurance so pt preferred I send a message back and see if medications can be sent in. I scheduled pt for OV tomorrow at 1500 but wife states if medications are called in before then, pt would cancel appt. Wife would like CB regarding medications being sent in or not.   Reason for Disposition  MILD or MODERATE vomiting (e.g., 1 - 5 times / day)  Answer Assessment - Initial Assessment Questions 1. VOMITING SEVERITY: "How many times have you vomited in the past 24 hours?"     - MILD:  1 - 2 times/day    - MODERATE: 3 - 5 times/day, decreased oral intake without significant weight loss or symptoms of dehydration    - SEVERE: 6 or more times/day, vomits everything or nearly everything, with significant weight loss, symptoms of dehydration      Unsure how many times, per wife pt called her sick 2. ONSET: "When did the vomiting begin?"      Today but has gotten worse  4. ABDOMEN PAIN: "Are your having any abdomen pain?" If Yes : "How bad is it  and what does it feel like?" (e.g., crampy, dull, intermittent, constant)      Yes, crampy 5. DIARRHEA: "Is there any diarrhea?" If Yes, ask: "How many times today?"      Yes unsure of # of times  Protocols used: Vomiting-A-AH

## 2022-04-17 ENCOUNTER — Ambulatory Visit: Payer: Self-pay

## 2022-04-17 ENCOUNTER — Ambulatory Visit: Payer: Managed Care, Other (non HMO) | Admitting: Physician Assistant

## 2022-04-17 NOTE — Progress Notes (Deleted)
      Established patient visit   Patient: Jared Watkins   DOB: 1975/12/27   46 y.o. Male  MRN: [id number] Visit Date: 04/17/2022  Today's healthcare provider: Mikey Kirschner, PA-C   No chief complaint on file.  Subjective    HPI  Patient is concerning a flare up of diverticulitis.   Medications: Outpatient Medications Prior to Visit  Medication Sig   albuterol (VENTOLIN HFA) 108 (90 Base) MCG/ACT inhaler INHALE 2 PUFFS INTO THE LUNGS EVERY 6 HOURS AS NEEDED FOR WHEEZING OR SHORTNESS OF BREATH   allopurinol (ZYLOPRIM) 100 MG tablet TAKE 1 TABLET(100 MG) BY MOUTH DAILY   ALPRAZolam (XANAX) 0.5 MG tablet Take 1 tablet (0.5 mg total) by mouth 2 (two) times daily as needed for sleep or anxiety.   colchicine (COLCRYS) 0.6 MG tablet TAKE 2 TABLETS BY MOUTH TODAY, THEN 1 TABLET DAILY UNTIL SYMPTOMS IMPROVED   fluticasone (FLONASE) 50 MCG/ACT nasal spray Place 2 sprays into both nostrils daily.   lisinopril-hydrochlorothiazide (ZESTORETIC) 10-12.5 MG tablet Take 2 tablets by mouth daily.   LORazepam (ATIVAN) 0.5 MG tablet Take 1 tablet (0.5 mg total) by mouth 2 (two) times daily as needed. for anxiety   metoprolol succinate (TOPROL-XL) 25 MG 24 hr tablet TAKE 1/2 TABLET(12.5 MG) BY MOUTH DAILY   omeprazole (PRILOSEC) 20 MG capsule TAKE 1 CAPSULE(20 MG) BY MOUTH DAILY   promethazine (PHENERGAN) 25 MG tablet Take 1 tablet (25 mg total) by mouth every 8 (eight) hours as needed for nausea or vomiting.   Semaglutide-Weight Management (WEGOVY) 2.4 MG/0.75ML SOAJ Inject 2.4 mg into the skin once a week.   sertraline (ZOLOFT) 100 MG tablet TAKE 1 TABLET(100 MG) BY MOUTH DAILY   No facility-administered medications prior to visit.    Review of Systems  Constitutional:  Negative for appetite change, chills and fever.  Respiratory:  Negative for chest tightness, shortness of breath and wheezing.   Cardiovascular:  Negative for chest pain and palpitations.  Gastrointestinal:  Negative for  abdominal pain, nausea and vomiting.    {Labs  Heme  Chem  Endocrine  Serology  Results Review (optional):23779}   Objective    There were no vitals taken for this visit. {Show previous vital signs (optional):23777}  Physical Exam  ***  No results found for any visits on 04/17/22.  Assessment & Plan     ***  No follow-ups on file.      {provider attestation***:1}   Mikey Kirschner, PA-C  [facility] [phone] (phone) [phone] (fax)  [place]

## 2022-04-17 NOTE — Telephone Encounter (Signed)
Sent note to wrong pool resent to [facility]. Someone canceled appt.

## 2022-04-17 NOTE — Telephone Encounter (Signed)
  Chief Complaint: moderate intermittent abd pain, black diarrhea stool x 1  Symptoms: vomiting, sour burps Frequency: yesterday am Pertinent Negatives: Patient denies back pain, fever, urination pain Disposition: '[x]'$ ED /'[]'$ Urgent Care (no appt availability in office) / '[]'$ Appointment(In office/virtual)/ '[]'$  [facility]/ '[]'$ Home Care/ '[]'$ Refused Recommended Disposition /'[]'$ [place] [facility]/ '[]'$  Follow-up with PCP Additional Notes: wife asked why send him to ED- Advised that she described his diarrhea as black which can be a sign of internal bleeding. Pt's wife told agent he may be too sick to come in to appt. Wife was curt during call. Pt had a 1500 appt and I DID NOT cancel appt. Wife stated that her husband was not sure is was black after all. Advised since their is a chance it could be did not feel an OV was appropriate at this time.  Reason for Disposition  Black or tarry bowel movements  (Exception: Chronic-unchanged black-grey BMs AND is taking iron pills or Pepto-Bismol.)  Answer Assessment - Initial Assessment Questions 1. LOCATION: "Where does it hurt?"      All over abdomen navel and above 2. RADIATION: "Does the pain shoot anywhere else?" (e.g., chest, back)     no 3. ONSET: "When did the pain begin?" (Minutes, hours or days ago)      Yesterday am 4. SUDDEN: "Gradual or sudden onset?"     gradually 5. PATTERN "Does the pain come and go, or is it constant?"    - If it comes and goes: "How long does it last?" "Do you have pain now?"     (Note: Comes and goes means the pain is intermittent. It goes away completely between bouts.)    - If constant: "Is it getting better, staying the same, or getting worse?"      (Note: Constant means the pain never goes away completely; most serious pain is constant and gets worse.)     intermittent 6. SEVERITY: "How bad is the pain?"  (e.g., Scale 1-10; mild, moderate, or severe)    - MILD (1-3): Doesn't interfere with normal activities,  abdomen soft and not tender to touch.     - MODERATE (4-7): Interferes with normal activities or awakens from sleep, abdomen tender to touch.     - SEVERE (8-10): Excruciating pain, doubled over, unable to do any normal activities.       7/10 7. RECURRENT SYMPTOM: "Have you ever had this type of stomach pain before?" If Yes, ask: "When was the last time?" and "What happened that time?"      yes 8. CAUSE: "What do you think is causing the stomach pain?"     diverticulitis 9. RELIEVING/AGGRAVATING FACTORS: "What makes it better or worse?" (e.g., antacids, bending or twisting motion, bowel movement)     *No Answer* 10. OTHER SYMPTOMS: "Do you have any other symptoms?" (e.g., back pain, diarrhea, fever, urination pain, vomiting)       Diarrhea (black), vomiting, sour stomach,  Protocols used: Abdominal Pain - Male-A-AH

## 2022-04-24 ENCOUNTER — Telehealth: Payer: Self-pay | Admitting: Family Medicine

## 2022-04-24 ENCOUNTER — Encounter: Payer: Self-pay | Admitting: Family Medicine

## 2022-04-24 ENCOUNTER — Ambulatory Visit (INDEPENDENT_AMBULATORY_CARE_PROVIDER_SITE_OTHER): Payer: Managed Care, Other (non HMO) | Admitting: Family Medicine

## 2022-04-24 VITALS — BP 124/85 | HR 83 | Temp 98.0°F | Resp 20 | Ht 74.0 in | Wt 337.0 lb

## 2022-04-24 DIAGNOSIS — I10 Essential (primary) hypertension: Secondary | ICD-10-CM | POA: Diagnosis not present

## 2022-04-24 DIAGNOSIS — Z23 Encounter for immunization: Secondary | ICD-10-CM

## 2022-04-24 MED ORDER — WEGOVY 2.4 MG/0.75ML ~~LOC~~ SOAJ: 2.4000 mg | SUBCUTANEOUS | 4 refills | Status: DC

## 2022-04-24 NOTE — Telephone Encounter (Signed)
Patient called back requesting that his Semaglutide be sent to [facility] mail order.

## 2022-04-24 NOTE — Addendum Note (Signed)
Addended by: Birdie Sons on: 04/24/2022 01:36 PM   Modules accepted: Orders

## 2022-04-24 NOTE — Progress Notes (Signed)
I,Sulibeya S Dimas,acting as a scribe for Lelon Huh, MD.,have documented all relevant documentation on the behalf of Lelon Huh, MD,as directed by  Lelon Huh, MD while in the presence of Lelon Huh, MD.     Established patient visit   Patient: Jared Watkins   DOB: [birth year]   46 y.o. Male  MRN: [id number] Visit Date: 04/24/2022  Today's healthcare provider: Lelon Huh, MD   Chief Complaint  Patient presents with   Hypertension   Anxiety   Subjective    HPI  Hypertension, follow-up  BP Readings from Last 3 Encounters:  04/24/22 124/85  03/13/22 (!) 138/96  11/25/21 (!) 124/92   Wt Readings from Last 3 Encounters:  04/24/22 (!) 337 lb (152.9 kg)  03/13/22 (!) 340 lb (154.2 kg)  11/25/21 (!) 344 lb 4.8 oz (156.2 kg)     He was last seen for hypertension 6 weeks ago.  BP at that visit was 138/96. Management since that visit includes increasing lisinopril-HCTZ 10-12.48m to 2 tablets daily  He reports excellent compliance with treatment. He is not having side effects.   Use of agents associated with hypertension: none.   Outside blood pressures are 140s. Symptoms: No chest pain No chest pressure  No palpitations No syncope  No dyspnea No orthopnea  No paroxysmal nocturnal dyspnea No lower extremity edema   Pertinent labs Lab Results  Component Value Date   CHOL 221 (H) 10/20/2021   HDL 46 10/20/2021   LDLCALC 139 (H) 10/20/2021   LDLDIRECT 99.0 04/06/2016   TRIG 202 (H) 10/20/2021   CHOLHDL 4.8 10/20/2021   Lab Results  Component Value Date   NA 142 10/20/2021   K 4.8 10/20/2021   CREATININE 1.08 10/20/2021   EGFR 86 10/20/2021   GLUCOSE 101 (H) 10/20/2021   TSH 1.190 05/29/2020     The 10-year ASCVD risk score (Arnett DK, et al., 2019) is: 3.3%  --------------------------------------------------------------------------------------------------- Follow up for anxiety  The patient was last seen for this 6 weeks ago. Changes made at  last visit include refill xanax 0.542m1 tablet 2 times daily prn.  He reports excellent compliance with treatment. He feels that condition is Unchanged. He is not having side effects.      03/13/2022    8:38 AM 09/17/2020    1:57 PM  GAD 7 : Generalized Anxiety Score  Nervous, Anxious, on Edge 0 2  Control/stop worrying 0 2  Worry too much - different things 0 2  Trouble relaxing 0 2  Restless 0 2  Easily annoyed or irritable 0 2  Afraid - awful might happen 0 0  Total GAD 7 Score 0 12  Anxiety Difficulty Not difficult at all Somewhat difficult   -----------------------------------------------------------------------------------------  Medications: Outpatient Medications Prior to Visit  Medication Sig   albuterol (VENTOLIN HFA) 108 (90 Base) MCG/ACT inhaler INHALE 2 PUFFS INTO THE LUNGS EVERY 6 HOURS AS NEEDED FOR WHEEZING OR SHORTNESS OF BREATH   allopurinol (ZYLOPRIM) 100 MG tablet TAKE 1 TABLET(100 MG) BY MOUTH DAILY   ALPRAZolam (XANAX) 0.5 MG tablet Take 1 tablet (0.5 mg total) by mouth 2 (two) times daily as needed for sleep or anxiety.   colchicine (COLCRYS) 0.6 MG tablet TAKE 2 TABLETS BY MOUTH TODAY, THEN 1 TABLET DAILY UNTIL SYMPTOMS IMPROVED   fluticasone (FLONASE) 50 MCG/ACT nasal spray Place 2 sprays into both nostrils daily.   lisinopril-hydrochlorothiazide (ZESTORETIC) 10-12.5 MG tablet Take 2 tablets by mouth daily.   LORazepam (ATIVAN) 0.5 MG tablet  Take 1 tablet (0.5 mg total) by mouth 2 (two) times daily as needed. for anxiety   metoprolol succinate (TOPROL-XL) 25 MG 24 hr tablet TAKE 1/2 TABLET(12.5 MG) BY MOUTH DAILY   omeprazole (PRILOSEC) 20 MG capsule TAKE 1 CAPSULE(20 MG) BY MOUTH DAILY   promethazine (PHENERGAN) 25 MG tablet Take 1 tablet (25 mg total) by mouth every 8 (eight) hours as needed for nausea or vomiting.   Semaglutide-Weight Management (WEGOVY) 2.4 MG/0.75ML SOAJ Inject 2.4 mg into the skin once a week.   sertraline (ZOLOFT) 100 MG tablet TAKE 1  TABLET(100 MG) BY MOUTH DAILY   No facility-administered medications prior to visit.    Review of Systems  Constitutional:  Negative for appetite change and fatigue.  Respiratory:  Negative for chest tightness and shortness of breath.   Cardiovascular:  Negative for chest pain and leg swelling.        Objective    BP 124/85 (BP Location: Right Arm, Patient Position: Sitting, Cuff Size: Large)   Pulse 83   Temp 98 F (36.7 C) (Oral)   Resp 20   Ht 6' 2"  (1.88 m)   Wt (!) 337 lb (152.9 kg)   BMI 43.27 kg/m  BP Readings from Last 3 Encounters:  04/24/22 124/85  03/13/22 (!) 138/96  11/25/21 (!) 124/92   Wt Readings from Last 3 Encounters:  04/24/22 (!) 337 lb (152.9 kg)  03/13/22 (!) 340 lb (154.2 kg)  11/25/21 (!) 344 lb 4.8 oz (156.2 kg)      Physical Exam  General appearance: Obese male, cooperative and in no acute distress Head: Normocephalic, without obvious abnormality, atraumatic Respiratory: Respirations even and unlabored, normal respiratory rate Extremities: All extremities are intact.  Skin: Skin color, texture, turgor normal. No rashes seen  Psych: Appropriate mood and affect. Neurologic: Mental status: Alert, oriented to person, place, and time, thought content appropriate.     Assessment & Plan     1. Primary hypertension Much better since increasing dose of lisinopril-hctz. Will change to 20/64m tablets with next refill due in October.   2. Severe obesity (BMI >= 40) ([place] Doing well on Semaglutide initiated 05/31/2020. Weight down from baseline of 375  3. Need for immunization against influenza  - Flu Vaccine QUAD 632moM (Fluarix, Fluzone & Alfiuria Quad PF)   Follow up with CPE in 4-5 months.       The entirety of the information documented in the History of Present Illness, Review of Systems and Physical Exam were personally obtained by me. Portions of this information were initially documented by the CMA and reviewed by me for thoroughness  and accuracy.     DoLelon HuhMD  [facility], [phone])  [place]

## 2022-05-06 ENCOUNTER — Telehealth: Payer: Self-pay | Admitting: Family Medicine

## 2022-05-06 NOTE — Telephone Encounter (Signed)
Patient's wife called stating his Mancel Parsons has to have a prior authorization.  He only has 1 shot left and needs tis done ASAP.

## 2022-05-07 NOTE — Telephone Encounter (Signed)
Called [facility]. They will faxing information to office.

## 2022-05-07 NOTE — Telephone Encounter (Signed)
Have you received a PA request for [facility]?

## 2022-05-07 NOTE — Telephone Encounter (Signed)
Haven't seen one. Can you call pharmacy and see if they any info. thanks

## 2022-05-12 ENCOUNTER — Other Ambulatory Visit: Payer: Self-pay | Admitting: Family Medicine

## 2022-05-12 DIAGNOSIS — I10 Essential (primary) hypertension: Secondary | ICD-10-CM

## 2022-05-12 MED ORDER — LISINOPRIL-HYDROCHLOROTHIAZIDE 20-25 MG PO TABS: 1.0000 | ORAL_TABLET | Freq: Every day | ORAL | 3 refills | Status: DC

## 2022-05-18 ENCOUNTER — Ambulatory Visit: Payer: Self-pay

## 2022-05-18 NOTE — Telephone Encounter (Signed)
Cover my meds called, spoke with representative. She is stating that plan is not found for pt for insurance, verified still [employer]. She is requesting to make sure insurance correct, was going to fax hard copy and I reach out to pt to make sure insurance up to date and if so send hard copy to insurance. Tried calling pt but VM not set up.   Plan # [phone], fax hard copy if insurance correct, if not submit new claim.   Summary: pa for wegovy   [place] with [employer] my [employer] called asking about a PA for Wegovy 2.4 mg over 0.5 ml auto injectors   Pts ins plan said patient not found   CB#   [phone]   REF  [id number]

## 2022-05-26 NOTE — Telephone Encounter (Signed)
Patient returned call. Patient verified he is still using [employer].

## 2022-05-26 NOTE — Telephone Encounter (Signed)
Tried calling pt again. No answer and no vm. [place] for [employer] to verify [employer]. Thanks.

## 2022-05-28 NOTE — Telephone Encounter (Signed)
Information and form faxed back to [facility].

## 2022-08-15 ENCOUNTER — Other Ambulatory Visit: Payer: Self-pay | Admitting: Family Medicine

## 2022-08-15 DIAGNOSIS — I10 Essential (primary) hypertension: Secondary | ICD-10-CM

## 2022-08-17 NOTE — Telephone Encounter (Signed)
Dosage changed. Requested Prescriptions  Pending Prescriptions Disp Refills   lisinopril-hydrochlorothiazide (ZESTORETIC) 10-12.5 MG tablet [Pharmacy Med Name: LISINOPRIL-HCTZ 10/12.'5MG'$  TABLETS] 90 tablet 4    Sig: TAKE 1 TABLET BY MOUTH DAILY     Cardiovascular:  ACEI + Diuretic Combos Failed - 08/15/2022  6:33 AM      Failed - Na in normal range and within 180 days    Sodium  Date Value Ref Range Status  10/20/2021 142 134 - 144 mmol/L Final         Failed - K in normal range and within 180 days    Potassium  Date Value Ref Range Status  10/20/2021 4.8 3.5 - 5.2 mmol/L Final         Failed - Cr in normal range and within 180 days    Creat  Date Value Ref Range Status  01/05/2014 0.93 0.50 - 1.35 mg/dL Final   Creatinine, Ser  Date Value Ref Range Status  10/20/2021 1.08 0.76 - 1.27 mg/dL Final   Creatinine,U  Date Value Ref Range Status  08/24/2012 90.2 mg/dL Final         Failed - eGFR is 30 or above and within 180 days    GFR calc Af Amer  Date Value Ref Range Status  05/29/2020 114 >59 mL/min/1.73 Final    Comment:    **In accordance with recommendations from the NKF-ASN Task force,**   [facility] is in the process of updating its eGFR calculation to the   2021 CKD-EPI creatinine equation that estimates kidney function   without a race variable.    GFR calc non Af Amer  Date Value Ref Range Status  05/29/2020 98 >59 mL/min/1.73 Final   GFR  Date Value Ref Range Status  04/22/2017 83.54 >60.00 mL/min Final   eGFR  Date Value Ref Range Status  10/20/2021 86 >59 mL/min/1.73 Final         Passed - Patient is not pregnant      Passed - Last BP in normal range    BP Readings from Last 1 Encounters:  04/24/22 124/85         Passed - Valid encounter within last 6 months    Recent Outpatient Visits           3 months ago Primary hypertension   [facility] Birdie Sons, MD   5 months ago Essential hypertension   [facility] Birdie Sons, MD   8 months ago Barada, Donald E, MD   10 months ago Severe obesity (BMI >= 40) [facility])   [facility] Birdie Sons, MD   1 year ago Severe obesity (BMI >= 40) [facility])   [facility] Birdie Sons, MD       Future Appointments             In 2 months Fisher, Kirstie Peri, MD [facility] [place]

## 2022-09-16 ENCOUNTER — Telehealth: Payer: Self-pay | Admitting: Family Medicine

## 2022-09-16 NOTE — Telephone Encounter (Signed)
Patient is asking to get samples of Ozempic please.

## 2022-09-18 NOTE — Telephone Encounter (Signed)
The only samples we have are the 0.64m pens, which is not a high enough dose to help with weight,  and we don't get enough of those to keep him supplied. Does his insurance not cover Wegovy anymore? Has he looked into Copay

## 2022-09-21 ENCOUNTER — Ambulatory Visit: Payer: Self-pay | Admitting: *Deleted

## 2022-09-21 NOTE — Telephone Encounter (Signed)
I can't get it.   The pharmacies don't have it in [place].     [facility] and [facility] do not have the [employer].     Reason for Disposition  [1] Caller has URGENT medicine question about med that PCP or specialist prescribed AND [2] triager unable to answer question  Answer Assessment - Initial Assessment Questions 1. NAME of MEDICINE: "What medicine(s) are you calling about?"     Wegovy 2. QUESTION: "What is your question?" (e.g., double dose of medicine, side effect)     It's a week or so before I can get the medicine because the pharmacy does not have it and it takes so long for it to come in.    The medicine starts wearing off about a week and a half after the last shot.    I've lost 30 lbs so it's working but I just can't get it at the pharmacy because they don't have it. 3. PRESCRIBER: "Who prescribed the medicine?" Reason: if prescribed by specialist, call should be referred to that group.     [name] 4. SYMPTOMS: "Do you have any symptoms?" If Yes, ask: "What symptoms are you having?"  "How bad are the symptoms (e.g., mild, moderate, severe)     Shot wears off before he can get more from the pharmacy because they don't have it in stock     On backorder. 5. PREGNANCY:  "Is there any chance that you are pregnant?" "When was your last menstrual period?"     N/A  Protocols used: Medication Question Call-A-AH

## 2022-09-21 NOTE — Telephone Encounter (Signed)
  Chief Complaint: Pt returned call.    Jared Watkins is not in stock at [employer] or [employer].   He isn't able to get it and he is out for over a week at the time. Symptoms: The last shot wears off before I can get my next rx due to being out of stock Frequency: n?a Pertinent Negatives: Patient denies N/A Disposition: '[]'$ ED /'[]'$ Urgent Care (no appt availability in office) / '[]'$ Appointment(In office/virtual)/ '[]'$  [place] [facility]/ '[]'$ Home Care/ '[]'$ Refused Recommended Disposition /'[]'$ [place] [facility]/ '[x]'$  Follow-up with PCP Additional Notes: Pt wanted to let [name] know he is having difficulty getting his Jared Watkins because the pharmacies are out of stock.    Did not want samples of Ozempic.

## 2022-09-21 NOTE — Telephone Encounter (Signed)
Pt called and   I gave him the message from [name].   His concern was that the pharmacies do not have the [facility] in [place].   He wasn't sure what to do when he uses his last shot and can't get any more.    A week and a half after his last shot it wears off.

## 2022-09-21 NOTE — Telephone Encounter (Signed)
Patient called, no answer, no voicemail set up.

## 2022-09-22 NOTE — Telephone Encounter (Signed)
We can try Zepbound if we wants, but I don't know if his insurance will cover it. We do have a few sample of Ozempic if he wants to use that until [facility] is available.

## 2022-10-10 ENCOUNTER — Other Ambulatory Visit: Payer: Self-pay | Admitting: Family Medicine

## 2022-10-10 DIAGNOSIS — K529 Noninfective gastroenteritis and colitis, unspecified: Secondary | ICD-10-CM

## 2022-10-23 ENCOUNTER — Encounter: Payer: Managed Care, Other (non HMO) | Admitting: Family Medicine

## 2022-12-02 ENCOUNTER — Other Ambulatory Visit: Payer: Self-pay | Admitting: Family Medicine

## 2022-12-02 ENCOUNTER — Telehealth: Payer: Self-pay | Admitting: Family Medicine

## 2022-12-02 NOTE — Telephone Encounter (Unsigned)
I spoke to the wife and she said he is out of town and an urgent care probably won't take his insurance.  She said she will have him call the office when he gets back in town.  She did reiterate that they have been told when this happens all they have to do is call the office and medicine will be called in.  I asked them to discuss that with Dr Sherrie Mustache the next time he is seen and if that is the case we will ask Dr Sherrie Mustache to put something in the chart.        Copied from CRM [id number]. Topic: General - Other >> Dec 02, 2022  3:42 PM [name] wrote: Reason for CRM: Pt wife states that she called the after hours on Saturday and requested a message be sent to Dr. Sherrie Mustache to call in pt medication for his diverticulitis. Pt wife states that they were told that this medicaiton is on standing order and whenever he needs it, it can be called in. Pt is out of town at work and will call back in the morning to give which [facility] into. >> Dec 02, 2022  3:49 PM [name] wrote: [address] [place]

## 2022-12-02 NOTE — Telephone Encounter (Signed)
Medication Refill - Medication:  Semaglutide-Weight Management (WEGOVY) 2.4 MG/0.75ML SOAJ   Has the patient contacted their pharmacy? Yes.   Pharmacy states that they are needing PA for medication.   Preferred Pharmacy (with phone number or street name): [facility]) [place], [place] - [address] [place]  Phone: [phone] Fax: [phone]  Has the patient been seen for an appointment in the last year OR does the patient have an upcoming appointment? Yes.    Agent: Please be advised that RX refills may take up to 3 business days. We ask that you follow-up with your pharmacy.

## 2022-12-03 NOTE — Telephone Encounter (Signed)
Requested Prescriptions  Pending Prescriptions Disp Refills   Semaglutide-Weight Management (WEGOVY) 2.4 MG/0.75ML SOAJ 9 mL 4    Sig: Inject 2.4 mg into the skin once a week.     Endocrinology:  Diabetes - GLP-1 Receptor Agonists - semaglutide Failed - 12/02/2022  4:02 PM      Failed - HBA1C in normal range and within 180 days    Hgb A1c MFr Bld  Date Value Ref Range Status  10/20/2021 5.8 (H) 4.8 - 5.6 % Final    Comment:             Prediabetes: 5.7 - 6.4          Diabetes: >6.4          Glycemic control for adults with diabetes: <7.0          Failed - Cr in normal range and within 360 days    Creat  Date Value Ref Range Status  01/05/2014 0.93 0.50 - 1.35 mg/dL Final   Creatinine, Ser  Date Value Ref Range Status  10/20/2021 1.08 0.76 - 1.27 mg/dL Final   Creatinine,U  Date Value Ref Range Status  08/24/2012 90.2 mg/dL Final         Failed - Valid encounter within last 6 months    Recent Outpatient Visits           7 months ago Primary hypertension   [place] [facility] Malva Limes, MD   8 months ago Essential hypertension   [place] [facility] Malva Limes, MD   1 year ago Gastroenteritis   [place] [facility] Malva Limes, MD   1 year ago Severe obesity (BMI >= 40) (HCC)   [place] [facility] Malva Limes, MD   1 year ago Severe obesity (BMI >= 40) (HCC)    [facility] Malva Limes, MD       Future Appointments             In 1 month Fisher, Demetrios Isaacs, MD [facility], [facility]

## 2022-12-03 NOTE — Telephone Encounter (Signed)
He needs to be seen for evaluation. Recommend he go to urgent care if he is out of town.

## 2022-12-04 ENCOUNTER — Telehealth: Payer: Self-pay | Admitting: Family Medicine

## 2022-12-04 NOTE — Telephone Encounter (Signed)
The spouse of the patient called in stating she went to refill his Semaglutide-Weight Management (WEGOVY) 2.4 MG/0.75ML SOAJ but [facility] is telling her it requires a prior authorization. He needs this done as soon as possible as he is now out of his medication. His last does was last week    [facility]) [place], [place] - [address] [place] Phone: [phone]  Fax: [phone]

## 2022-12-11 ENCOUNTER — Telehealth: Payer: Self-pay | Admitting: Family Medicine

## 2022-12-11 NOTE — Telephone Encounter (Signed)
Covermymeds is requesting prior authorization Key: [id number] Name: Rabbitt Needs additional information on PA

## 2022-12-16 ENCOUNTER — Telehealth: Payer: Self-pay

## 2022-12-16 NOTE — Telephone Encounter (Signed)
Error

## 2022-12-18 NOTE — Telephone Encounter (Signed)
Pt's wife called in checking status of Jared Watkins, she called in about starting back at the end of April. I let her know it was just started on May 8 for [name]. She says was told by insurnace company  if its not completed soon it will expire Monday and they will have to start all over.

## 2022-12-19 NOTE — Telephone Encounter (Signed)
PA renewed, completed and submitted today 12/19/2022

## 2022-12-21 ENCOUNTER — Telehealth: Payer: Self-pay | Admitting: Family Medicine

## 2022-12-21 NOTE — Telephone Encounter (Signed)
Covermymeds requesting prior authorizations Key: [id number] Name: Lyng Requesting wegovy

## 2022-12-28 ENCOUNTER — Telehealth: Payer: Self-pay

## 2022-12-28 NOTE — Telephone Encounter (Signed)
Copied from CRM [id number]. Topic: General - Other >> Dec 28, 2022  3:52 PM Everette C wrote: Reason for CRM: The patient's wife has called for an update on a previously requested prior authorization for [facility]   Please contact further when possible

## 2022-12-29 ENCOUNTER — Telehealth: Payer: Self-pay | Admitting: Family Medicine

## 2022-12-29 NOTE — Telephone Encounter (Signed)
Appeal submitted today, 12/29/2022

## 2022-12-29 NOTE — Telephone Encounter (Signed)
Covermymeds is requesting prior authorization Key: [id number] Name: Jared Watkins to start prescription for [facility]

## 2022-12-31 ENCOUNTER — Other Ambulatory Visit: Payer: Self-pay | Admitting: Family Medicine

## 2022-12-31 DIAGNOSIS — F419 Anxiety disorder, unspecified: Secondary | ICD-10-CM

## 2022-12-31 DIAGNOSIS — F410 Panic disorder [episodic paroxysmal anxiety] without agoraphobia: Secondary | ICD-10-CM

## 2022-12-31 NOTE — Telephone Encounter (Signed)
Requested medication (s) are due for refill today:   Yes  Requested medication (s) are on the active medication list:   Yes  Future visit scheduled:   Yes 6/17 with Dr. Sherrie Mustache   Last ordered: 12/21/2021 #90, 4 refills  Returned because labs are due per protocol.     Requested Prescriptions  Pending Prescriptions Disp Refills   sertraline (ZOLOFT) 100 MG tablet [Pharmacy Med Name: SERTRALINE 100MG  TABLETS] 90 tablet 4    Sig: TAKE 1 TABLET(100 MG) BY MOUTH DAILY     Psychiatry:  Antidepressants - SSRI - sertraline Failed - 12/31/2022  3:37 AM      Failed - AST in normal range and within 360 days    AST  Date Value Ref Range Status  10/20/2021 42 (H) 0 - 40 IU/L Final         Failed - ALT in normal range and within 360 days    ALT  Date Value Ref Range Status  10/20/2021 46 (H) 0 - 44 IU/L Final         Failed - Valid encounter within last 6 months    Recent Outpatient Visits           8 months ago Primary hypertension   [place] [facility] Malva Limes, MD   9 months ago Essential hypertension   [place] [facility] Malva Limes, MD   1 year ago Gastroenteritis   [place] [facility] Malva Limes, MD   1 year ago Severe obesity (BMI >= 40) (HCC)   [place] [facility] Malva Limes, MD   1 year ago Severe obesity (BMI >= 40) (HCC)   [place] [facility] Malva Limes, MD       Future Appointments             In 3 weeks Fisher, Demetrios Isaacs, MD [facility]            Passed - Completed PHQ-2 or PHQ-9 in the last 360 days

## 2023-01-25 ENCOUNTER — Ambulatory Visit (INDEPENDENT_AMBULATORY_CARE_PROVIDER_SITE_OTHER): Payer: Managed Care, Other (non HMO) | Admitting: Family Medicine

## 2023-01-25 ENCOUNTER — Encounter: Payer: Self-pay | Admitting: Family Medicine

## 2023-01-25 VITALS — BP 133/97 | HR 86 | Ht 76.0 in | Wt 335.0 lb

## 2023-01-25 DIAGNOSIS — Z86010 Personal history of colonic polyps: Secondary | ICD-10-CM | POA: Diagnosis not present

## 2023-01-25 DIAGNOSIS — Z8719 Personal history of other diseases of the digestive system: Secondary | ICD-10-CM

## 2023-01-25 DIAGNOSIS — I10 Essential (primary) hypertension: Secondary | ICD-10-CM

## 2023-01-25 DIAGNOSIS — R7401 Elevation of levels of liver transaminase levels: Secondary | ICD-10-CM

## 2023-01-25 DIAGNOSIS — Z0000 Encounter for general adult medical examination without abnormal findings: Secondary | ICD-10-CM | POA: Diagnosis not present

## 2023-01-25 DIAGNOSIS — E785 Hyperlipidemia, unspecified: Secondary | ICD-10-CM

## 2023-01-25 DIAGNOSIS — K760 Fatty (change of) liver, not elsewhere classified: Secondary | ICD-10-CM

## 2023-01-25 DIAGNOSIS — Z1211 Encounter for screening for malignant neoplasm of colon: Secondary | ICD-10-CM

## 2023-01-25 DIAGNOSIS — F419 Anxiety disorder, unspecified: Secondary | ICD-10-CM | POA: Diagnosis not present

## 2023-01-25 DIAGNOSIS — Z860101 Personal history of adenomatous and serrated colon polyps: Secondary | ICD-10-CM

## 2023-01-25 DIAGNOSIS — R7303 Prediabetes: Secondary | ICD-10-CM

## 2023-01-25 DIAGNOSIS — E790 Hyperuricemia without signs of inflammatory arthritis and tophaceous disease: Secondary | ICD-10-CM

## 2023-01-25 MED ORDER — FLUTICASONE PROPIONATE 50 MCG/ACT NA SUSP: 2.0000 | Freq: Every day | NASAL | 3 refills | Status: AC

## 2023-01-25 NOTE — Patient Instructions (Signed)
Please review the attached list of medications and notify my office if there are any errors.  ° °Call  GI at [phone]    °
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
## 2023-01-26 ENCOUNTER — Other Ambulatory Visit: Payer: Self-pay | Admitting: Family Medicine

## 2023-01-26 DIAGNOSIS — I10 Essential (primary) hypertension: Secondary | ICD-10-CM

## 2023-01-26 LAB — COMPREHENSIVE METABOLIC PANEL
ALT: 37 IU/L (ref 0–44)
AST: 34 IU/L (ref 0–40)
Albumin: 4.7 g/dL (ref 4.1–5.1)
Alkaline Phosphatase: 113 IU/L (ref 44–121)
BUN/Creatinine Ratio: 18 (ref 9–20)
BUN: 18 mg/dL (ref 6–24)
Bilirubin Total: 0.5 mg/dL (ref 0.0–1.2)
CO2: 25 mmol/L (ref 20–29)
Calcium: 9.9 mg/dL (ref 8.7–10.2)
Chloride: 101 mmol/L (ref 96–106)
Creatinine, Ser: 0.98 mg/dL (ref 0.76–1.27)
Globulin, Total: 3 g/dL (ref 1.5–4.5)
Glucose: 103 mg/dL — ABNORMAL HIGH (ref 70–99)
Potassium: 4.9 mmol/L (ref 3.5–5.2)
Sodium: 139 mmol/L (ref 134–144)
Total Protein: 7.7 g/dL (ref 6.0–8.5)
eGFR: 96 mL/min/{1.73_m2} (ref >59)

## 2023-01-26 LAB — HEMOGLOBIN A1C
Est. average glucose Bld gHb Est-mCnc: 114 mg/dL
Hgb A1c MFr Bld: 5.6 % (ref 4.8–5.6)

## 2023-01-26 LAB — LIPID PANEL
Chol/HDL Ratio: 5.1 ratio — ABNORMAL HIGH (ref 0.0–5.0)
Cholesterol, Total: 254 mg/dL — ABNORMAL HIGH (ref 100–199)
HDL: 50 mg/dL (ref >39)
LDL Chol Calc (NIH): 174 mg/dL — ABNORMAL HIGH (ref 0–99)
Triglycerides: 163 mg/dL — ABNORMAL HIGH (ref 0–149)
VLDL Cholesterol Cal: 30 mg/dL (ref 5–40)

## 2023-01-26 LAB — CBC
Hematocrit: 49 % (ref 37.5–51.0)
Hemoglobin: 16.6 g/dL (ref 13.0–17.7)
MCH: 29.9 pg (ref 26.6–33.0)
MCHC: 33.9 g/dL (ref 31.5–35.7)
MCV: 88 fL (ref 79–97)
Platelets: 252 10*3/uL (ref 150–450)
RBC: 5.56 x10E6/uL (ref 4.14–5.80)
RDW: 13 % (ref 11.6–15.4)
WBC: 11.3 10*3/uL — ABNORMAL HIGH (ref 3.4–10.8)

## 2023-01-26 LAB — URIC ACID: Uric Acid: 6.5 mg/dL (ref 3.8–8.4)

## 2023-01-26 LAB — TSH: TSH: 0.825 u[IU]/mL (ref 0.450–4.500)

## 2023-01-28 ENCOUNTER — Telehealth: Payer: Self-pay | Admitting: *Deleted

## 2023-01-28 NOTE — Telephone Encounter (Signed)
Pt given lab results per notes of Dr. Sherrie Mustache on 01/27/23 on 01/28/23. Pt verbalized understanding. Cholesterol is up from 221 last year to 254 now. Rest of labs are good. Not high enough at this point to require medications, but he needs to strictly avoid sugars, sweets and cut back on saturated fats, especially red meats and pork. Please schedule follow up in 6 months to check on BP and cholesterol.   F/u appt scheduled for 07/30/23.

## 2023-02-02 ENCOUNTER — Other Ambulatory Visit: Payer: Self-pay | Admitting: *Deleted

## 2023-02-02 ENCOUNTER — Telehealth: Payer: Self-pay | Admitting: *Deleted

## 2023-02-02 DIAGNOSIS — Z86010 Personal history of colonic polyps: Secondary | ICD-10-CM

## 2023-02-02 MED ORDER — NA SULFATE-K SULFATE-MG SULF 17.5-3.13-1.6 GM/177ML PO SOLN: 1.0000 | Freq: Once | ORAL | 0 refills | Status: AC

## 2023-02-02 NOTE — Telephone Encounter (Signed)
Gastroenterology Pre-Procedure Review  Request Date: 03/29/2023 Requesting Physician: Dr. Servando Snare  PATIENT REVIEW QUESTIONS: The patient responded to the following health history questions as indicated:    1. Are you having any GI issues? no 2. Do you have a personal history of Polyps? yes (11/10/2021) 3. Do you have a family history of Colon Cancer or Polyps? no 4. Diabetes Mellitus? no 5. Joint replacements in the past 12 months?no 6. Major health problems in the past 3 months?no 7. Any artificial heart valves, MVP, or defibrillator?no    MEDICATIONS & ALLERGIES:    Patient reports the following regarding taking any anticoagulation/antiplatelet therapy:   Plavix, Coumadin, Eliquis, Xarelto, Lovenox, Pradaxa, Brilinta, or Effient? no Aspirin? no  Patient confirms/reports the following medications:  Current Outpatient Medications  Medication Sig Dispense Refill   albuterol (VENTOLIN HFA) 108 (90 Base) MCG/ACT inhaler INHALE 2 PUFFS INTO THE LUNGS EVERY 6 HOURS AS NEEDED FOR WHEEZING OR SHORTNESS OF BREATH 6.7 g 6   allopurinol (ZYLOPRIM) 100 MG tablet TAKE 1 TABLET(100 MG) BY MOUTH DAILY 90 tablet 1   ALPRAZolam (XANAX) 0.5 MG tablet Take 1 tablet (0.5 mg total) by mouth 2 (two) times daily as needed for sleep or anxiety. 60 tablet 1   colchicine (COLCRYS) 0.6 MG tablet TAKE 2 TABLETS BY MOUTH TODAY, THEN 1 TABLET DAILY UNTIL SYMPTOMS IMPROVED (Patient not taking: Reported on 01/25/2023) 90 tablet 4   fluticasone (FLONASE) 50 MCG/ACT nasal spray Place 2 sprays into both nostrils daily. 16 g 3   lisinopril-hydrochlorothiazide (ZESTORETIC) 20-25 MG tablet Take 1 tablet by mouth daily. 90 tablet 3   LORazepam (ATIVAN) 0.5 MG tablet Take 1 tablet (0.5 mg total) by mouth 2 (two) times daily as needed. for anxiety 30 tablet 5   metoprolol succinate (TOPROL-XL) 25 MG 24 hr tablet TAKE 1/2 TABLET(12.5 MG) BY MOUTH DAILY 45 tablet 4   omeprazole (PRILOSEC) 20 MG capsule TAKE 1 CAPSULE(20 MG) BY MOUTH  DAILY 90 capsule 4   promethazine (PHENERGAN) 25 MG tablet TAKE 1 TABLET(25 MG) BY MOUTH EVERY 8 HOURS AS NEEDED FOR NAUSEA OR VOMITING 10 tablet 0   Semaglutide-Weight Management (WEGOVY) 2.4 MG/0.75ML SOAJ Inject 2.4 mg into the skin once a week. (Patient not taking: Reported on 01/25/2023) 9 mL 4   sertraline (ZOLOFT) 100 MG tablet TAKE 1 TABLET(100 MG) BY MOUTH DAILY 90 tablet 0   No current facility-administered medications for this visit.    Patient confirms/reports the following allergies:  Allergies  Allergen Reactions   Amlodipine     Muscle pains    No orders of the defined types were placed in this encounter.   AUTHORIZATION INFORMATION Primary Insurance: 1D#: Group #:  Secondary Insurance: 1D#: Group #:  SCHEDULE INFORMATION: Date: 03/29/2023 Time: Location: [facility]

## 2023-02-22 ENCOUNTER — Other Ambulatory Visit: Payer: Self-pay | Admitting: Family Medicine

## 2023-02-22 DIAGNOSIS — M109 Gout, unspecified: Secondary | ICD-10-CM

## 2023-02-23 ENCOUNTER — Telehealth: Payer: Self-pay | Admitting: Family Medicine

## 2023-02-23 NOTE — Telephone Encounter (Signed)
Patient's wife called stating that Jared Watkins is requiring a pier to [employer] in order for Link Snuffer to get Bray.   They told him he would need to enroll in [employer] and give his PCP the Username/ID# for weight watchers and when the pier to pier was done, his PCP would need to tell them the info.   His username is [id number].  The # to call to get a pier to pier is [phone]

## 2023-03-08 ENCOUNTER — Ambulatory Visit (INDEPENDENT_AMBULATORY_CARE_PROVIDER_SITE_OTHER): Payer: Managed Care, Other (non HMO) | Admitting: Family Medicine

## 2023-03-08 DIAGNOSIS — H1013 Acute atopic conjunctivitis, bilateral: Secondary | ICD-10-CM | POA: Diagnosis not present

## 2023-03-08 DIAGNOSIS — R110 Nausea: Secondary | ICD-10-CM | POA: Diagnosis not present

## 2023-03-08 DIAGNOSIS — K529 Noninfective gastroenteritis and colitis, unspecified: Secondary | ICD-10-CM

## 2023-03-08 MED ORDER — AZELASTINE HCL 0.05 % OP SOLN: 1.0000 [drp] | Freq: Two times a day (BID) | OPHTHALMIC | 3 refills | Status: AC

## 2023-03-08 MED ORDER — PROMETHAZINE HCL 25 MG PO TABS
25.0000 mg | ORAL_TABLET | Freq: Three times a day (TID) | ORAL | 1 refills | Status: DC | PRN
Start: 2023-03-08 — End: 2024-03-08

## 2023-03-09 ENCOUNTER — Telehealth: Payer: Self-pay

## 2023-03-09 ENCOUNTER — Telehealth: Payer: Self-pay | Admitting: Family Medicine

## 2023-03-09 NOTE — Telephone Encounter (Signed)
Copied from CRM [id number]. Topic: General - Other >> Mar 09, 2023 10:54 AM Macon Large wrote: Reason for CRM: Marissa with [employer] called for an update on the prior authorization for [facility]. Cb# [phone]

## 2023-03-09 NOTE — Telephone Encounter (Signed)
[employer] is requesting prior authorization Key: [id number] Name: Counselman Complete the PA started for patient

## 2023-03-11 NOTE — Telephone Encounter (Signed)
Wegovy was denied.

## 2023-03-12 NOTE — Telephone Encounter (Signed)
Please advise.    02/23/23  4:20 PM Note Patient's wife called stating that [employer] is requiring a pier to [employer] in order for Jared Watkins to get [place].   They told him he would need to enroll in [employer] and give his PCP the Username/ID# for weight watchers and when the pier to pier was done, his PCP would need to tell them the info.   His username is [id number].  The # to call to get a pier to pier is [phone]

## 2023-03-12 NOTE — Telephone Encounter (Signed)
Requested to schedule peer to peer. Advise I will be called for peer to peer within the next week.

## 2023-03-15 DIAGNOSIS — U071 COVID-19: Secondary | ICD-10-CM

## 2023-03-15 HISTORY — DX: COVID-19: U07.1

## 2023-03-19 NOTE — Progress Notes (Signed)
Established patient visit   Patient: Jared Watkins   DOB: 04-18-1976   47 y.o. Male  MRN: [id number] Visit Date: 03/08/2023  Today's healthcare provider: Mila Merry, MD   No chief complaint on file.  Subjective    HPI HPI   Patient reports he is here to discuss paperwork needed for weight loss injection to be covered by insurance. States he was advsied by them he needs to join [employer]. Patient reports he also has some concerns with hand shakes somewhat like a small tremor and would like to see about getting eye drops for seasonal allergies Last edited by Acey Lav, CMA on 03/08/2023  2:56 PM.      Had been on semaglutide from november 2021 early this year and had tolerated maximum dose of 2.4mg  per week, during which he had sustained weight lost 40 pounds, >10% of his baseline weight. He felt much better with weight loss, but weight plateaued and now starting to gain again.  Wt Readings from Last 12 Encounters:  03/08/23 (!) 337 lb 3.2 oz (153 kg)  01/25/23 (!) 335 lb (152 kg)  04/24/22 (!) 337 lb (152.9 kg)  03/13/22 (!) 340 lb (154.2 kg)  11/25/21 (!) 344 lb 4.8 oz (156.2 kg)  11/10/21 (!) 343 lb (155.6 kg)  10/20/21 (!) 350 lb (158.8 kg)  04/21/21 (!) 370 lb (167.8 kg)  10/28/20 (!) 355 lb (161 kg)  10/07/20 (!) 355 lb 12.8 oz (161.4 kg)  09/17/20 (!) 359 lb (162.8 kg)  05/29/20 (!) 375 lb 12.8 oz (170.5 kg)    Previously tried phentermine and topiramate which were not effective. Had about 6 month trial of Saxenda in 2016-2017 which was not effective. He tried and failed multiple diet programs prior to trial of Wegovy.  His insurance denied refill of medications and requires that he enroll in weight watchers, which he has done.   Medications: Outpatient Medications Prior to Visit  Medication Sig   albuterol (VENTOLIN HFA) 108 (90 Base) MCG/ACT inhaler INHALE 2 PUFFS INTO THE LUNGS EVERY 6 HOURS AS NEEDED FOR WHEEZING OR SHORTNESS OF BREATH    allopurinol (ZYLOPRIM) 100 MG tablet TAKE 1 TABLET(100 MG) BY MOUTH DAILY   ALPRAZolam (XANAX) 0.5 MG tablet Take 1 tablet (0.5 mg total) by mouth 2 (two) times daily as needed for sleep or anxiety.   colchicine (COLCRYS) 0.6 MG tablet TAKE 2 TABLETS BY MOUTH TODAY, THEN 1 TABLET DAILY UNTIL SYMPTOMS IMPROVED   fluticasone (FLONASE) 50 MCG/ACT nasal spray Place 2 sprays into both nostrils daily.   lisinopril-hydrochlorothiazide (ZESTORETIC) 20-25 MG tablet Take 1 tablet by mouth daily.   LORazepam (ATIVAN) 0.5 MG tablet Take 1 tablet (0.5 mg total) by mouth 2 (two) times daily as needed. for anxiety   metoprolol succinate (TOPROL-XL) 25 MG 24 hr tablet TAKE 1/2 TABLET(12.5 MG) BY MOUTH DAILY   omeprazole (PRILOSEC) 20 MG capsule TAKE 1 CAPSULE(20 MG) BY MOUTH DAILY   Semaglutide-Weight Management (WEGOVY) 2.4 MG/0.75ML SOAJ Inject 2.4 mg into the skin once a week.   sertraline (ZOLOFT) 100 MG tablet TAKE 1 TABLET(100 MG) BY MOUTH DAILY   [DISCONTINUED] promethazine (PHENERGAN) 25 MG tablet TAKE 1 TABLET(25 MG) BY MOUTH EVERY 8 HOURS AS NEEDED FOR NAUSEA OR VOMITING   No facility-administered medications prior to visit.   Patient Active Problem List   Diagnosis Date Noted   Prediabetes 01/25/2023   History of adenomatous polyp of colon 11/14/2021   Colon cancer screening  Diarrhea of presumed infectious origin 08/27/2020   History of diverticulitis 08/27/2020   Elevated transaminase level 05/20/2019   Hyperuricemia 05/12/2019   History of Helicobacter pylori infection 04/26/2017   Anxiety 03/03/2016   Gout 11/20/2014   Hyperlipidemia 11/20/2014   Severe obesity (BMI >= 40) (HCC) 07/17/2014   Obstructive sleep apnea 02/08/2013   Gastroesophageal reflux disease 03/12/2012   Post traumatic stress disorder 02/19/2012   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/2012   Hypertension 02/08/2012   Panic attacks 02/08/2012   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 Normal rhythm. No murmurs, rubs, or gallops.    MS:   All extremities are intact.    Neurologic:   Awake, alert, oriented x 3. No apparent focal neurological defect.         Assessment & Plan     1. Severe obesity (BMI >= 40) (HCC) Multiple weigh related comormidities with high risk of serious morbidity or mortality without continued weight loss. Insurance coverage was denied and requires that he enroll in [employer] which he has done. Will contact insurance to appear denial of potentially life saving medication.   2. Allergic conjunctivitis of both eyes Has taken optivar in past which worked well.  refill azelastine (OPTIVAR) 0.05 % ophthalmic solution; Place 1 drop into both eyes 2 (two) times daily.  Dispense: 6 mL; Refill: 3  3. Nausea Has had intermittently and done well with prn phenergan in the past.  refill promethazine (PHENERGAN) 25 MG tablet; Take 1 tablet (25 mg total) by mouth every 8 (eight) hours as needed for nausea or vomiting.  Dispense: 20 tablet; Refill: 1   >45 minutes required to review his medical record, counseling patient regarding his conditions and coordination of care        Mila Merry, MD  [facility] [phone] (phone) [phone] (fax)  [facility]

## 2023-03-19 NOTE — Telephone Encounter (Signed)
Copied from CRM [id number]. Topic: General - Other >> Mar 19, 2023  1:03 PM Macon Large wrote: Reason for CRM: Carina with [employer] requests prior authorization for Semaglutide-Weight Management (WEGOVY) 2.4 MG/0.75ML SOAJ because the previous PA was denied. Cb# [phone]

## 2023-03-22 ENCOUNTER — Telehealth: Payer: Self-pay | Admitting: Family Medicine

## 2023-03-22 NOTE — Telephone Encounter (Signed)
New PA started on 03/09/23 due to previous being denied. Please see other telephone encounter

## 2023-03-22 NOTE — Telephone Encounter (Signed)
Patient called wanting to know if you had done the Peer to Peer call about his Jared Watkins?   He said the insurance company hasn't heard anything from our office since 03/05/23

## 2023-03-22 NOTE — Telephone Encounter (Signed)
New PA started.

## 2023-03-23 ENCOUNTER — Encounter: Payer: Self-pay | Admitting: Gastroenterology

## 2023-03-23 NOTE — Telephone Encounter (Signed)
Message from Plan [id number] case has been cancelled for [facility] INJ 2.4MG , use as directed, for the following reason: WEGOVY INJ 2.4MG  has been denied on 03/09/2023. Please follow the appeals process outlined in the original denial letter.
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
## 2023-03-23 NOTE — Telephone Encounter (Signed)
Called on 02-20-2023. Was told would receive call back sometime the week of 8-5 between 12pm and 1pm to complete peer to peer review. Was available at those times but no one every called back.

## 2023-03-29 ENCOUNTER — Ambulatory Visit: Payer: Managed Care, Other (non HMO) | Admitting: Anesthesiology

## 2023-03-29 ENCOUNTER — Ambulatory Visit
Admission: RE | Admit: 2023-03-29 | Discharge: 2023-03-29 | Disposition: A | Discharge status: Home / Self Care | Payer: Managed Care, Other (non HMO) | Source: Ambulatory Visit | Attending: Gastroenterology | Admitting: Gastroenterology

## 2023-03-29 ENCOUNTER — Encounter: Payer: Self-pay | Admitting: Gastroenterology

## 2023-03-29 ENCOUNTER — Other Ambulatory Visit: Payer: Self-pay

## 2023-03-29 ENCOUNTER — Encounter
Admission: RE | Disposition: A | Discharge status: Home / Self Care | Payer: Self-pay | Source: Ambulatory Visit | Attending: Gastroenterology

## 2023-03-29 DIAGNOSIS — D123 Benign neoplasm of transverse colon: Secondary | ICD-10-CM

## 2023-03-29 DIAGNOSIS — Z860100 Personal history of colon polyps, unspecified: Secondary | ICD-10-CM

## 2023-03-29 DIAGNOSIS — K5730 Diverticulosis of large intestine without perforation or abscess without bleeding: Secondary | ICD-10-CM | POA: Diagnosis not present

## 2023-03-29 DIAGNOSIS — Z8616 Personal history of COVID-19: Secondary | ICD-10-CM | POA: Insufficient documentation

## 2023-03-29 DIAGNOSIS — M109 Gout, unspecified: Secondary | ICD-10-CM | POA: Diagnosis not present

## 2023-03-29 DIAGNOSIS — K219 Gastro-esophageal reflux disease without esophagitis: Secondary | ICD-10-CM | POA: Insufficient documentation

## 2023-03-29 DIAGNOSIS — Z1211 Encounter for screening for malignant neoplasm of colon: Secondary | ICD-10-CM | POA: Insufficient documentation

## 2023-03-29 DIAGNOSIS — Z800 Family history of malignant neoplasm of digestive organs: Secondary | ICD-10-CM | POA: Diagnosis not present

## 2023-03-29 DIAGNOSIS — K635 Polyp of colon: Secondary | ICD-10-CM | POA: Diagnosis not present

## 2023-03-29 DIAGNOSIS — G4730 Sleep apnea, unspecified: Secondary | ICD-10-CM | POA: Diagnosis not present

## 2023-03-29 DIAGNOSIS — Z860101 Personal history of adenomatous and serrated colon polyps: Secondary | ICD-10-CM

## 2023-03-29 DIAGNOSIS — K640 First degree hemorrhoids: Secondary | ICD-10-CM

## 2023-03-29 DIAGNOSIS — I10 Essential (primary) hypertension: Secondary | ICD-10-CM | POA: Diagnosis not present

## 2023-03-29 DIAGNOSIS — Z09 Encounter for follow-up examination after completed treatment for conditions other than malignant neoplasm: Secondary | ICD-10-CM | POA: Diagnosis not present

## 2023-03-29 DIAGNOSIS — Z83719 Family history of colon polyps, unspecified: Secondary | ICD-10-CM | POA: Insufficient documentation

## 2023-03-29 DIAGNOSIS — Z6841 Body Mass Index (BMI) 40.0 and over, adult: Secondary | ICD-10-CM | POA: Insufficient documentation

## 2023-03-29 DIAGNOSIS — D122 Benign neoplasm of ascending colon: Secondary | ICD-10-CM

## 2023-03-29 HISTORY — PX: POLYPECTOMY: SHX5525

## 2023-03-29 HISTORY — PX: COLONOSCOPY WITH PROPOFOL: SHX5780

## 2023-03-29 HISTORY — DX: Morbid (severe) obesity due to excess calories: E66.01

## 2023-03-29 SURGERY — COLONOSCOPY WITH PROPOFOL
Anesthesia: General | Site: Rectum

## 2023-03-29 MED ORDER — SODIUM CHLORIDE 0.9 % IV SOLN: INTRAVENOUS | Status: DC

## 2023-03-29 MED ORDER — PROPOFOL 10 MG/ML IV BOLUS
INTRAVENOUS | Status: DC | PRN
  Administered 2023-03-29: 40 mg via INTRAVENOUS
  Administered 2023-03-29: 50 mg via INTRAVENOUS
  Administered 2023-03-29: 30 mg via INTRAVENOUS
  Administered 2023-03-29: 110 mg via INTRAVENOUS

## 2023-03-29 MED ORDER — LIDOCAINE HCL (CARDIAC) PF 100 MG/5ML IV SOSY
PREFILLED_SYRINGE | INTRAVENOUS | Status: DC | PRN
  Administered 2023-03-29: 60 mg via INTRAVENOUS

## 2023-03-29 MED ORDER — LACTATED RINGERS IV SOLN: INTRAVENOUS | Status: DC

## 2023-03-29 SURGICAL SUPPLY — 9 items
FORCEPS BIOP RAD 4 LRG CAP 4 (CUTTING FORCEPS) IMPLANT
GOWN CVR UNV OPN BCK APRN NK (MISCELLANEOUS) ×4 IMPLANT
GOWN ISOL THUMB LOOP REG UNIV (MISCELLANEOUS) ×4
KIT PRC NS LF DISP ENDO (KITS) ×2 IMPLANT
KIT PROCEDURE OLYMPUS (KITS) ×2
MANIFOLD NEPTUNE II (INSTRUMENTS) ×2 IMPLANT
SNARE COLD EXACTO (MISCELLANEOUS) IMPLANT
TRAP ETRAP POLY (MISCELLANEOUS) IMPLANT
WATER STERILE IRR 250ML POUR (IV SOLUTION) ×2 IMPLANT

## 2023-03-29 NOTE — Transfer of Care (Signed)
Immediate Anesthesia Transfer of Care Note  Patient: Jared Watkins  Procedure(s) Performed: COLONOSCOPY WITH PROPOFOL  Patient Location: PACU  Anesthesia Type: General  Level of Consciousness: awake, alert  and patient cooperative  Airway and Oxygen Therapy: Patient Spontanous Breathing and Patient connected to supplemental oxygen  Post-op Assessment: Post-op Vital signs reviewed, Patient's Cardiovascular Status Stable, Respiratory Function Stable, Patent Airway and No signs of Nausea or vomiting  Post-op Vital Signs: Reviewed and stable  Complications: No notable events documented.

## 2023-03-29 NOTE — Op Note (Signed)
[facility] Gastroenterology Patient Name: Jared Watkins Procedure Date: 03/29/2023 9:03 AM MRN: [id number] Account #: [id number] Date of Birth: March 17, 1976 Admit Type: Outpatient Age: 47 Room: [facility] OR ROOM 01 Gender: Male Note Status: Finalized Instrument Name: [id number] Procedure:             Colonoscopy Indications:           High risk colon cancer surveillance: Personal history                         of colonic polyps Providers:             Midge Minium MD, MD Referring MD:          Demetrios Isaacs. Sherrie Mustache, MD (Referring MD) Medicines:             Propofol per Anesthesia Complications:         No immediate complications. Procedure:             Pre-Anesthesia Assessment:                        - Prior to the procedure, a History and Physical was                         performed, and patient medications and allergies were                         reviewed. The patient's tolerance of previous                         anesthesia was also reviewed. The risks and benefits                         of the procedure and the sedation options and risks                         were discussed with the patient. All questions were                         answered, and informed consent was obtained. Prior                         Anticoagulants: The patient has taken no anticoagulant                         or antiplatelet agents. ASA Grade Assessment: II - A                         patient with mild systemic disease. After reviewing                         the risks and benefits, the patient was deemed in                         satisfactory condition to undergo the procedure.                        After obtaining informed consent, the colonoscope was  passed under direct vision. Throughout the procedure,                         the patient's blood pressure, pulse, and oxygen                         saturations were monitored continuously. The                          Colonoscope was introduced through the anus and                         advanced to the the cecum, identified by appendiceal                         orifice and ileocecal valve. The colonoscopy was                         performed without difficulty. The patient tolerated                         the procedure well. The quality of the bowel                         preparation was adequate to identify polyps. Findings:      The perianal and digital rectal examinations were normal.      Two sessile polyps were found in the ascending colon. The polyps were 1       to 2 mm in size. These polyps were removed with a cold biopsy forceps.       Resection and retrieval were complete.      Two sessile polyps were found in the transverse colon. The polyps were 3       to 4 mm in size. These polyps were removed with a cold snare. Resection       and retrieval were complete.      Multiple small-mouthed diverticula were found in the sigmoid colon.      Non-bleeding internal hemorrhoids were found during retroflexion. The       hemorrhoids were Grade I (internal hemorrhoids that do not prolapse). Impression:            - Two 1 to 2 mm polyps in the ascending colon, removed                         with a cold biopsy forceps. Resected and retrieved.                        - Two 3 to 4 mm polyps in the transverse colon,                         removed with a cold snare. Resected and retrieved.                        - Diverticulosis in the sigmoid colon.                        - Non-bleeding internal hemorrhoids. Recommendation:        - Discharge patient to home.                        -  Resume previous diet.                        - Continue present medications.                        - Await pathology results.                        - Repeat colonoscopy in 3 years for surveillance. Procedure Code(s):     --- Professional ---                        [phone], Colonoscopy, flexible; with removal of                          tumor(s), polyp(s), or other lesion(s) by snare                         technique                        45380, 59, Colonoscopy, flexible; with biopsy, single                         or multiple Diagnosis Code(s):     --- Professional ---                        Z86.010, Personal history of colonic polyps                        D12.2, Benign neoplasm of ascending colon CPT copyright 2022 American Medical Association. All rights reserved. The codes documented in this report are preliminary and upon coder review may  be revised to meet current compliance requirements. Midge Minium MD, MD 03/29/2023 9:27:28 AM This report has been signed electronically. Number of Addenda: 0 Note Initiated On: 03/29/2023 9:03 AM Scope Withdrawal Time: 0 hours 10 minutes 40 seconds  Total Procedure Duration: 0 hours 15 minutes 56 seconds  Estimated Blood Loss:  Estimated blood loss: none.      [facility]

## 2023-03-29 NOTE — Anesthesia Postprocedure Evaluation (Signed)
Anesthesia Post Note  Patient: Jared Watkins  Procedure(s) Performed: COLONOSCOPY WITH PROPOFOL POLYPECTOMY (Rectum)  Patient location during evaluation: PACU Anesthesia Type: General Level of consciousness: awake and alert Pain management: pain level controlled Vital Signs Assessment: post-procedure vital signs reviewed and stable Respiratory status: spontaneous breathing, nonlabored ventilation, respiratory function stable and patient connected to nasal cannula oxygen Cardiovascular status: blood pressure returned to baseline and stable Postop Assessment: no apparent nausea or vomiting Anesthetic complications: no   No notable events documented.   Last Vitals:  Vitals:   03/29/23 0833 03/29/23 0930  BP: (!) 138/97 128/86  Pulse: 82 85  Resp: 20   Temp: (!) 36.1 C 36.5 C  SpO2: 96% 96%    Last Pain:  Vitals:   03/29/23 0930  TempSrc:   PainSc: Asleep                 Regie Bunner C Burl Tauzin
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
## 2023-03-30 ENCOUNTER — Encounter: Payer: Self-pay | Admitting: Gastroenterology

## 2023-03-31 ENCOUNTER — Telehealth: Payer: Self-pay

## 2023-03-31 ENCOUNTER — Encounter: Payer: Self-pay | Admitting: Gastroenterology

## 2023-03-31 NOTE — Telephone Encounter (Signed)
Copied from CRM [id number]. Topic: General - Other >> Mar 30, 2023  3:37 PM Macon Large wrote: Reason for CRM: Dane with [employer] reports that the peer to peer regarding the Reginal Lutes has been cancelled because it does not qualify for peer to peer review. Cb# [phone]

## 2023-04-05 ENCOUNTER — Telehealth: Payer: Self-pay | Admitting: Family Medicine

## 2023-04-05 NOTE — Telephone Encounter (Signed)
The appeals fax # is [phone] and the reference # that needs to be on the cover sheet is [id number]

## 2023-04-05 NOTE — Telephone Encounter (Signed)
Patient called to let us know his insurance has denied Wegovy again.   Is there anything else you can do to help get him approved?

## 2023-04-06 ENCOUNTER — Other Ambulatory Visit: Payer: Self-pay | Admitting: Family Medicine

## 2023-04-06 DIAGNOSIS — F410 Panic disorder [episodic paroxysmal anxiety] without agoraphobia: Secondary | ICD-10-CM

## 2023-04-06 DIAGNOSIS — F419 Anxiety disorder, unspecified: Secondary | ICD-10-CM

## 2023-04-08 NOTE — Telephone Encounter (Signed)
Requested Prescriptions  Pending Prescriptions Disp Refills   sertraline (ZOLOFT) 100 MG tablet [Pharmacy Med Name: SERTRALINE 100MG  TABLETS] 90 tablet 1    Sig: TAKE 1 TABLET(100 MG) BY MOUTH DAILY     Psychiatry:  Antidepressants - SSRI - sertraline Passed - 04/06/2023  9:02 PM      Passed - AST in normal range and within 360 days    AST  Date Value Ref Range Status  01/25/2023 34 0 - 40 IU/L Final         Passed - ALT in normal range and within 360 days    ALT  Date Value Ref Range Status  01/25/2023 37 0 - 44 IU/L Final         Passed - Completed PHQ-2 or PHQ-9 in the last 360 days      Passed - Valid encounter within last 6 months    Recent Outpatient Visits           1 month ago Severe obesity (BMI >= 40) (HCC)   [facility] Malva Limes, MD   2 months ago Annual physical exam   [facility] Malva Limes, MD   11 months ago Primary hypertension   [place] [facility] Malva Limes, MD   1 year ago Essential hypertension   [place] [facility] Malva Limes, MD   1 year ago Gastroenteritis   [place] [facility] Malva Limes, MD       Future Appointments             In 3 months Fisher, Demetrios Isaacs, MD [facility]

## 2023-04-14 ENCOUNTER — Telehealth: Payer: Self-pay

## 2023-04-14 NOTE — Telephone Encounter (Signed)
Received patient result letter for colonoscopy results returned in the mail. Patient had colonoscopy with Dr. Servando Snare

## 2023-04-16 ENCOUNTER — Telehealth: Payer: Self-pay | Admitting: Family Medicine

## 2023-04-16 NOTE — Telephone Encounter (Signed)
I called for the peer to peer a few weeks ago, after which was told that it was not eligible for peer to peer review.

## 2023-04-16 NOTE — Telephone Encounter (Signed)
Patient is wanting to know if his appeal for his wegovy prior authorization has been sent in.  He has been waiting since July for this.   Please contact patient and let him know the status.[phone]

## 2023-04-16 NOTE — Telephone Encounter (Signed)
Was there an appeal made from this message on 04/05/23?

## 2023-04-20 NOTE — Telephone Encounter (Signed)
See other encounters. Appeal and notes were faxed today.

## 2023-04-20 NOTE — Telephone Encounter (Signed)
Appeal request was faxed today with office notes to [phone] Case ID: [id number]

## 2023-04-21 NOTE — Telephone Encounter (Signed)
Patient aware Reginal Lutes got approved through the appeal

## 2023-04-23 NOTE — Telephone Encounter (Signed)
error 

## 2023-05-19 ENCOUNTER — Other Ambulatory Visit: Payer: Self-pay | Admitting: Family Medicine

## 2023-06-09 ENCOUNTER — Other Ambulatory Visit: Payer: Self-pay | Admitting: Family Medicine

## 2023-06-10 NOTE — Telephone Encounter (Signed)
Requested Prescriptions  Pending Prescriptions Disp Refills   omeprazole (PRILOSEC) 20 MG capsule [Pharmacy Med Name: OMEPRAZOLE 20MG  CAPSULES] 90 capsule 4    Sig: TAKE 1 CAPSULE(20 MG) BY MOUTH DAILY     Gastroenterology: Proton Pump Inhibitors Passed - 06/09/2023  3:34 PM      Passed - Valid encounter within last 12 months    Recent Outpatient Visits           3 months ago Severe obesity (BMI >= 40) (HCC)   [place] [facility] Malva Limes, MD   4 months ago Annual physical exam   [facility] Malva Limes, MD   1 year ago Primary hypertension   [facility] Malva Limes, MD   1 year ago Essential hypertension   [facility] Malva Limes, MD   1 year ago Gastroenteritis   [place] [facility] Malva Limes, MD       Future Appointments             In 1 month Fisher, Demetrios Isaacs, MD [facility]             lisinopril-hydrochlorothiazide (ZESTORETIC) 20-25 MG tablet [Pharmacy Med Name: LISINOPRIL-HCTZ 20/25MG  TABLETS] 90 tablet 3    Sig: TAKE 1 TABLET BY MOUTH DAILY     Cardiovascular:  ACEI + Diuretic Combos Passed - 06/09/2023  3:34 PM      Passed - Na in normal range and within 180 days    Sodium  Date Value Ref Range Status  01/25/2023 139 134 - 144 mmol/L Final         Passed - K in normal range and within 180 days    Potassium  Date Value Ref Range Status  01/25/2023 4.9 3.5 - 5.2 mmol/L Final         Passed - Cr in normal range and within 180 days    Creat  Date Value Ref Range Status  01/05/2014 0.93 0.50 - 1.35 mg/dL Final   Creatinine, Ser  Date Value Ref Range Status  01/25/2023 0.98 0.76 - 1.27 mg/dL Final   Creatinine,U  Date Value Ref Range Status  08/24/2012 90.2 mg/dL Final         Passed - eGFR is 30 or above and within 180 days    GFR calc Af Amer  Date Value Ref Range  Status  05/29/2020 114 >59 mL/min/1.73 Final    Comment:    **In accordance with recommendations from the NKF-ASN Task force,**   [facility] is in the process of updating its eGFR calculation to the   2021 CKD-EPI creatinine equation that estimates kidney function   without a race variable.    GFR calc non Af Amer  Date Value Ref Range Status  05/29/2020 98 >59 mL/min/1.73 Final   GFR  Date Value Ref Range Status  04/22/2017 83.54 >60.00 mL/min Final   eGFR  Date Value Ref Range Status  01/25/2023 96 >59 mL/min/1.73 Final         Passed - Patient is not pregnant      Passed - Last BP in normal range    BP Readings from Last 1 Encounters:  03/29/23 111/60         Passed - Valid encounter within last 6 months    Recent Outpatient Visits           3 months ago Severe  obesity (BMI >= 40) (HCC)   [facility] Malva Limes, MD   4 months ago Annual physical exam   [facility] Malva Limes, MD   1 year ago Primary hypertension   [place] [facility] Malva Limes, MD   1 year ago Essential hypertension   [place] [facility] Malva Limes, MD   1 year ago Gastroenteritis   [place] [facility] Malva Limes, MD       Future Appointments             In 1 month Fisher, Demetrios Isaacs, MD [facility]

## 2023-07-05 ENCOUNTER — Other Ambulatory Visit: Payer: Self-pay | Admitting: Family Medicine

## 2023-07-30 ENCOUNTER — Ambulatory Visit: Payer: Managed Care, Other (non HMO) | Admitting: Family Medicine

## 2023-07-30 DIAGNOSIS — E785 Hyperlipidemia, unspecified: Secondary | ICD-10-CM | POA: Diagnosis not present

## 2023-07-30 DIAGNOSIS — I10 Essential (primary) hypertension: Secondary | ICD-10-CM

## 2023-07-30 DIAGNOSIS — D229 Melanocytic nevi, unspecified: Secondary | ICD-10-CM

## 2023-07-30 DIAGNOSIS — G4700 Insomnia, unspecified: Secondary | ICD-10-CM

## 2023-07-30 MED ORDER — ESZOPICLONE 3 MG PO TABS: 3.0000 mg | ORAL_TABLET | Freq: Every evening | ORAL | 1 refills | Status: AC | PRN

## 2023-07-30 MED ORDER — METOPROLOL SUCCINATE ER 25 MG PO TB24: 12.5000 mg | ORAL_TABLET | Freq: Every day | ORAL | 4 refills | Status: DC

## 2023-07-30 NOTE — Progress Notes (Unsigned)
Established patient visit   Patient: Jared Watkins   DOB: 25-Jun-1976   47 y.o. Male  MRN: [id number] Visit Date: 07/30/2023  Today's healthcare provider: Mila Merry, MD   Chief Complaint  Patient presents with   Hypertension   Hyperlipidemia   Subjective    Follow up htn, lipids, and obesity. Had been off of Wegovy a few months due to delays by insurance, but has been back on it for about 3 weeks. Was approved for 2.4. patient not sure of dose he has now. Taking other meds consistently. No new c/o. He also has pale well circumscribed mole about 8mm dm on his right cheek he says bothers him. He states it's been there several year and grown slightly.   He would also like to start back on Lunesta which worked very well when he took it year ago, but was too expensive to continue at that time.   Lab Results  Component Value Date   CHOL 254 (H) 01/25/2023   HDL 50 01/25/2023   LDLCALC 174 (H) 01/25/2023   LDLDIRECT 99.0 04/06/2016   TRIG 163 (H) 01/25/2023   CHOLHDL 5.1 (H) 01/25/2023   Lab Results  Component Value Date   NA 139 01/25/2023   CL 101 01/25/2023   K 4.9 01/25/2023   CO2 25 01/25/2023   BUN 18 01/25/2023   CREATININE 0.98 01/25/2023   EGFR 96 01/25/2023   CALCIUM 9.9 01/25/2023   ALBUMIN 4.7 01/25/2023   GLUCOSE 103 (H) 01/25/2023   Lab Results  Component Value Date   HGBA1C 5.6 01/25/2023   Wt Readings from Last 6 Encounters:  07/30/23 (!) 345 lb (156.5 kg)  03/29/23 (!) 336 lb 3.2 oz (152.5 kg)  03/08/23 (!) 337 lb 3.2 oz (153 kg)  01/25/23 (!) 335 lb (152 kg)  04/24/22 (!) 337 lb (152.9 kg)  03/13/22 (!) 340 lb (154.2 kg)     Medications: Outpatient Medications Prior to Visit  Medication Sig   albuterol (VENTOLIN HFA) 108 (90 Base) MCG/ACT inhaler INHALE 2 PUFFS INTO THE LUNGS EVERY 6 HOURS AS NEEDED FOR WHEEZING OR SHORTNESS OF BREATH   allopurinol (ZYLOPRIM) 100 MG tablet TAKE 1 TABLET(100 MG) BY MOUTH DAILY   ALPRAZolam (XANAX) 0.5  MG tablet Take 1 tablet (0.5 mg total) by mouth 2 (two) times daily as needed for sleep or anxiety.   azelastine (OPTIVAR) 0.05 % ophthalmic solution Place 1 drop into both eyes 2 (two) times daily.   colchicine (COLCRYS) 0.6 MG tablet TAKE 2 TABLETS BY MOUTH TODAY, THEN 1 TABLET DAILY UNTIL SYMPTOMS IMPROVED   fexofenadine (ALLEGRA) 180 MG tablet Take 180 mg by mouth daily.   fluticasone (FLONASE) 50 MCG/ACT nasal spray Place 2 sprays into both nostrils daily.   lisinopril-hydrochlorothiazide (ZESTORETIC) 20-25 MG tablet TAKE 1 TABLET BY MOUTH DAILY   LORazepam (ATIVAN) 0.5 MG tablet Take 1 tablet (0.5 mg total) by mouth 2 (two) times daily as needed. for anxiety   MAGNESIUM PO Take by mouth daily.   Multiple Vitamins-Minerals (MULTIVITAMIN MEN PO) Take by mouth daily.   omeprazole (PRILOSEC) 20 MG capsule TAKE 1 CAPSULE(20 MG) BY MOUTH DAILY   promethazine (PHENERGAN) 25 MG tablet Take 1 tablet (25 mg total) by mouth every 8 (eight) hours as needed for nausea or vomiting.   sertraline (ZOLOFT) 100 MG tablet TAKE 1 TABLET(100 MG) BY MOUTH DAILY   WEGOVY 2.4 MG/0.75ML SOAJ INJECT THE CONTENTS OF ONE PEN  SUBCUTANEOUSLY WEEKLY AS  DIRECTED   metoprolol succinate (TOPROL-XL) 25 MG 24 hr tablet TAKE 1/2 TABLET(12.5 MG) BY MOUTH DAILY   No facility-administered medications prior to visit.    Review of Systems  Constitutional:  Negative for appetite change, chills and fever.  Respiratory:  Negative for chest tightness, shortness of breath and wheezing.   Cardiovascular:  Negative for chest pain and palpitations.  Gastrointestinal:  Negative for abdominal pain, nausea and vomiting.   {Insert previous labs (optional):23779} {See past labs  Heme  Chem  Endocrine  Serology  Results Review (optional):1}   Objective    BP 129/88   Pulse 84   Resp 18   Ht 6\' 2"  (1.88 m)   Wt (!) 345 lb (156.5 kg)   SpO2 99%   BMI 44.30 kg/m  {Insert last BP/Wt (optional):23777}{See vitals history  (optional):1}  Physical Exam  General appearance: Severely obese male, cooperative and in no acute distress Head: Normocephalic, without obvious abnormality, atraumatic Respiratory: Respirations even and unlabored, normal respiratory rate Extremities: All extremities are intact.  Skin: Non pigmented mold on right cheek as described in HPI.  Psych: Appropriate mood and affect. Neurologic: Mental status: Alert, oriented to person, place, and time, thought content appropriate.   Assessment & Plan     1. Severe obesity (BMI >= 40) (HCC) (Primary) Recently back on Wegovy. He is going to call and let me know what dose he current has and will send refills to mail order.   2. Hyperlipidemia, unspecified hyperlipidemia type Not*** curently medicated, expect improvement if he loses significant weight on Wegovy.   3. Primary hypertension Well controlled.  Continue current medications.    - metoprolol succinate (TOPROL-XL) 25 MG 24 hr tablet; Take 0.5 tablets (12.5 mg total) by mouth daily.  Dispense: 45 tablet; Refill: 4  4. Benign mole Is growing minimally, but bothering him.  - Ambulatory referral to Dermatology  5. Insomnia, unspecified type Did well with Lunesta many years ago.  - eszopiclone 3 MG TABS; Take 1 tablet (3 mg total) by mouth at bedtime as needed. Take immediately before bedtime  Dispense: 30 tablet; Refill: 1   Return in about 4 months (around 11/28/2023) for Hypertension.         Mila Merry, MD  [facility] [phone] (phone) [phone] (fax)  [facility]

## 2023-07-30 NOTE — Patient Instructions (Addendum)
Please review the attached list of medications and notify my office if there are any errors.   Double check the dose of Wegovy that you are taking so we can send refills of the correct dose.

## 2023-08-23 ENCOUNTER — Ambulatory Visit
Admission: RE | Admit: 2023-08-23 | Discharge: 2023-08-23 | Disposition: A | Discharge status: Home / Self Care | Payer: Managed Care, Other (non HMO) | Source: Ambulatory Visit | Attending: Emergency Medicine | Admitting: Emergency Medicine

## 2023-08-23 VITALS — BP 124/82 | HR 94 | Temp 98.0°F | Resp 18

## 2023-08-23 DIAGNOSIS — H6502 Acute serous otitis media, left ear: Secondary | ICD-10-CM | POA: Diagnosis not present

## 2023-08-23 MED ORDER — PREDNISONE 10 MG (21) PO TBPK: ORAL_TABLET | Freq: Every day | ORAL | 0 refills | Status: DC

## 2023-08-23 MED ORDER — AMOXICILLIN-POT CLAVULANATE 875-125 MG PO TABS: 1.0000 | ORAL_TABLET | Freq: Two times a day (BID) | ORAL | 0 refills | Status: DC

## 2023-08-23 NOTE — ED Triage Notes (Signed)
 Patient to Urgent Care with complaints of nasal congestion/ ear fullness/ headaches/ sinus pain and pressure/ discolored nasal drainage.   Reports symptoms started one week ago  Meds: Corporate treasurer
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
## 2023-08-23 NOTE — Discharge Instructions (Signed)
 Today you are being treated for an infection of the eardrum  Take Augmentin   twice daily for 7 days, you should begin to see improvement after 48 hours of medication use and then it should progressively get better  Begin  use a prednisone  every morning with food as directed to help reduce sinus pressure  You may use Tylenol  for management of discomfort  May hold warm compresses to the ear for additional comfort  Please not attempted any ear cleaning or object or fluid placement into the ear canal to prevent further irritation   For cough: honey 1/2 to 1 teaspoon (you can dilute the honey in water  or another fluid).  You can also use guaifenesin and dextromethorphan for cough. You can use a humidifier for chest congestion and cough.  If you don't have a humidifier, you can sit in the bathroom with the hot shower running.      For sore throat: try warm salt water  gargles, cepacol lozenges, throat spray, warm tea or water  with lemon/honey, popsicles or ice, or OTC cold relief medicine for throat discomfort.   For congestion: take a daily anti-histamine like Zyrtec, Claritin, and a oral decongestant, such as pseudoephedrine.  You can also use Flonase  1-2 sprays in each nostril daily.   It is important to stay hydrated: drink plenty of fluids (water , gatorade/powerade/pedialyte, juices, or teas) to keep your throat moisturized and help further relieve irritation/discomfort.

## 2023-10-11 ENCOUNTER — Other Ambulatory Visit: Payer: Self-pay | Admitting: Family Medicine

## 2023-10-11 DIAGNOSIS — F419 Anxiety disorder, unspecified: Secondary | ICD-10-CM

## 2023-10-11 DIAGNOSIS — F410 Panic disorder [episodic paroxysmal anxiety] without agoraphobia: Secondary | ICD-10-CM

## 2023-10-12 NOTE — Telephone Encounter (Signed)
 Requested Prescriptions  Pending Prescriptions Disp Refills   sertraline (ZOLOFT) 100 MG tablet [Pharmacy Med Name: SERTRALINE 100MG  TABLETS] 90 tablet 0    Sig: TAKE 1 TABLET(100 MG) BY MOUTH DAILY     Psychiatry:  Antidepressants - SSRI - sertraline Passed - 10/12/2023  2:42 PM      Passed - AST in normal range and within 360 days    AST  Date Value Ref Range Status  01/25/2023 34 0 - 40 IU/L Final         Passed - ALT in normal range and within 360 days    ALT  Date Value Ref Range Status  01/25/2023 37 0 - 44 IU/L Final         Passed - Completed PHQ-2 or PHQ-9 in the last 360 days      Passed - Valid encounter within last 6 months    Recent Outpatient Visits           2 months ago Severe obesity (BMI >= 40) (HCC)   [facility] Malva Limes, MD   7 months ago Severe obesity (BMI >= 40) (HCC)   [facility] Malva Limes, MD   8 months ago Annual physical exam   [facility] Malva Limes, MD   1 year ago Primary hypertension   [facility] Malva Limes, MD   1 year ago Essential hypertension   [place] [facility] Malva Limes, MD       Future Appointments             In 1 month Fisher, Demetrios Isaacs, MD [facility]

## 2023-10-14 ENCOUNTER — Other Ambulatory Visit: Payer: Self-pay | Admitting: Family Medicine

## 2023-10-14 DIAGNOSIS — F419 Anxiety disorder, unspecified: Secondary | ICD-10-CM

## 2023-10-14 NOTE — Telephone Encounter (Signed)
 Requested medication (s) are due for refill today - expired Rx  Requested medication (s) are on the active medication list -yes  Future visit scheduled -yes  Last refill: 03/12/22 #30 5RF  Notes to clinic: non delegated Rx  Requested Prescriptions  Pending Prescriptions Disp Refills   LORazepam (ATIVAN) 0.5 MG tablet [Pharmacy Med Name: LORAZEPAM 0.5MG  TABLETS] 30 tablet     Sig: TAKE 1 TABLET(0.5 MG) BY MOUTH TWICE DAILY AS NEEDED FOR ANXIETY     Not Delegated - Psychiatry: Anxiolytics/Hypnotics 2 Failed - 10/14/2023  9:02 AM      Failed - This refill cannot be delegated      Failed - Urine Drug Screen completed in last 360 days      Passed - Patient is not pregnant      Passed - Valid encounter within last 6 months    Recent Outpatient Visits           2 months ago Severe obesity (BMI >= 40) (HCC)   [facility] Malva Limes, MD   7 months ago Severe obesity (BMI >= 40) (HCC)   [place] Malva Limes, MD   8 months ago Annual physical exam   [facility] Malva Limes, MD   1 year ago Primary hypertension   [facility] Malva Limes, MD   1 year ago Essential hypertension   [place] [facility] Malva Limes, MD       Future Appointments             In 1 month Fisher, Demetrios Isaacs, MD [facility]               Requested Prescriptions  Pending Prescriptions Disp Refills   LORazepam (ATIVAN) 0.5 MG tablet [Pharmacy Med Name: LORAZEPAM 0.5MG  TABLETS] 30 tablet     Sig: TAKE 1 TABLET(0.5 MG) BY MOUTH TWICE DAILY AS NEEDED FOR ANXIETY     Not Delegated - Psychiatry: Anxiolytics/Hypnotics 2 Failed - 10/14/2023  9:02 AM      Failed - This refill cannot be delegated      Failed - Urine Drug Screen completed in last 360 days      Passed - Patient is not pregnant      Passed - Valid encounter  within last 6 months    Recent Outpatient Visits           2 months ago Severe obesity (BMI >= 40) (HCC)   [facility] Malva Limes, MD   7 months ago Severe obesity (BMI >= 40) (HCC)   [place] [facility] Malva Limes, MD   8 months ago Annual physical exam   [facility] Malva Limes, MD   1 year ago Primary hypertension   [place] [facility] Malva Limes, MD   1 year ago Essential hypertension   [place] [facility] Malva Limes, MD       Future Appointments             In 1 month Fisher, Demetrios Isaacs, MD [facility], [facility]

## 2023-12-06 ENCOUNTER — Ambulatory Visit: Payer: Self-pay | Admitting: Family Medicine

## 2023-12-06 ENCOUNTER — Encounter: Payer: Self-pay | Admitting: Family Medicine

## 2023-12-06 DIAGNOSIS — F410 Panic disorder [episodic paroxysmal anxiety] without agoraphobia: Secondary | ICD-10-CM

## 2023-12-06 DIAGNOSIS — F419 Anxiety disorder, unspecified: Secondary | ICD-10-CM | POA: Diagnosis not present

## 2023-12-06 DIAGNOSIS — I10 Essential (primary) hypertension: Secondary | ICD-10-CM | POA: Diagnosis not present

## 2023-12-06 DIAGNOSIS — F5101 Primary insomnia: Secondary | ICD-10-CM | POA: Insufficient documentation

## 2023-12-06 MED ORDER — ZEPBOUND 7.5 MG/0.5ML ~~LOC~~ SOAJ: 7.5000 mg | SUBCUTANEOUS | 0 refills | Status: DC

## 2023-12-06 NOTE — Patient Instructions (Signed)
 Marland Kitchen  Please review the attached list of medications and notify my office if there are any errors.   . Please bring all of your medications to every appointment so we can make sure that our medication list is the same as yours.

## 2023-12-06 NOTE — Progress Notes (Signed)
 Established patient visit   Patient: Jared Watkins   DOB: Jun 08, 1976   48 y.o. Male  MRN: [id number] Visit Date: 12/06/2023  Today's healthcare provider: Jeralene Mom, MD   Chief Complaint  Patient presents with   Hypertension   Anxiety   Obesity   Subjective    Hypertension Associated symptoms include anxiety. Pertinent negatives include no chest pain, palpitations or shortness of breath.  Anxiety Patient reports no chest pain, nausea, palpitations or shortness of breath.      Follow up htn, anxiety and obesity. States he has been back on Wegovy  2.4 for several months. He had stopped all sweetened drinks, but started drinking sweetened soft drinks again several weeks ago. He generally feels very good and feels wegovy  is helping reduced appetite, but is gaining weight again. Continues on sertraline  for anxiety which he feels is under very good control. Rarely takes alprazolam .   Wt Readings from Last 8 Encounters:  12/06/23 (!) 352 lb 9.6 oz (159.9 kg)  07/30/23 (!) 345 lb (156.5 kg)  03/29/23 (!) 336 lb 3.2 oz (152.5 kg)  03/08/23 (!) 337 lb 3.2 oz (153 kg)  01/25/23 (!) 335 lb (152 kg)  04/24/22 (!) 337 lb (152.9 kg)  03/13/22 (!) 340 lb (154.2 kg)  11/25/21 (!) 344 lb 4.8 oz (156.2 kg)     Medications: Outpatient Medications Prior to Visit  Medication Sig   albuterol  (VENTOLIN  HFA) 108 (90 Base) MCG/ACT inhaler INHALE 2 PUFFS INTO THE LUNGS EVERY 6 HOURS AS NEEDED FOR WHEEZING OR SHORTNESS OF BREATH   ALPRAZolam  (XANAX ) 0.5 MG tablet Take 1 tablet (0.5 mg total) by mouth 2 (two) times daily as needed for sleep or anxiety.   azelastine  (OPTIVAR ) 0.05 % ophthalmic solution Place 1 drop into both eyes 2 (two) times daily.   colchicine  (COLCRYS ) 0.6 MG tablet TAKE 2 TABLETS BY MOUTH TODAY, THEN 1 TABLET DAILY UNTIL SYMPTOMS IMPROVED   eszopiclone  3 MG TABS Take 1 tablet (3 mg total) by mouth at bedtime as needed. Take immediately before bedtime   fexofenadine  (ALLEGRA) 180 MG tablet Take 180 mg by mouth daily.   fluticasone  (FLONASE ) 50 MCG/ACT nasal spray Place 2 sprays into both nostrils daily.   lisinopril -hydrochlorothiazide  (ZESTORETIC ) 20-25 MG tablet TAKE 1 TABLET BY MOUTH DAILY   LORazepam  (ATIVAN ) 0.5 MG tablet TAKE 1 TABLET(0.5 MG) BY MOUTH TWICE DAILY AS NEEDED FOR ANXIETY   MAGNESIUM PO Take by mouth daily.   metoprolol  succinate (TOPROL -XL) 25 MG 24 hr tablet Take 0.5 tablets (12.5 mg total) by mouth daily.   Multiple Vitamins-Minerals (MULTIVITAMIN MEN PO) Take by mouth daily.   omeprazole  (PRILOSEC) 20 MG capsule TAKE 1 CAPSULE(20 MG) BY MOUTH DAILY   promethazine  (PHENERGAN ) 25 MG tablet Take 1 tablet (25 mg total) by mouth every 8 (eight) hours as needed for nausea or vomiting.   sertraline  (ZOLOFT ) 100 MG tablet TAKE 1 TABLET(100 MG) BY MOUTH DAILY   WEGOVY  2.4 MG/0.75ML SOAJ INJECT THE CONTENTS OF ONE PEN  SUBCUTANEOUSLY WEEKLY AS  DIRECTED   allopurinol  (ZYLOPRIM ) 100 MG tablet TAKE 1 TABLET(100 MG) BY MOUTH DAILY (Patient not taking: Reported on 12/06/2023)   No facility-administered medications prior to visit.    Review of Systems  Constitutional:  Negative for appetite change, chills and fever.  Respiratory:  Negative for chest tightness, shortness of breath and wheezing.   Cardiovascular:  Negative for chest pain and palpitations.  Gastrointestinal:  Negative for abdominal pain, nausea and  vomiting.       Objective    BP (!) 130/95 (BP Location: Right Arm, Patient Position: Sitting, Cuff Size: Large)   Pulse 88   Resp 16   Ht 6\' 2"  (1.88 m)   Wt (!) 352 lb 9.6 oz (159.9 kg)   SpO2 97%   BMI 45.27 kg/m    Physical Exam   General appearance: Severely obese male, cooperative and in no acute distress Head: Normocephalic, without obvious abnormality, atraumatic Respiratory: Respirations even and unlabored, normal respiratory rate Extremities: All extremities are intact.  Skin: Skin color, texture, turgor normal.  No rashes seen  Psych: Appropriate mood and affect. Neurologic: Mental status: Alert, oriented to person, place, and time, thought content appropriate.    Assessment & Plan     1. Severe obesity (BMI >= 40) (HCC) (Primary) Taking Wegovy  2.4 consistently every week and feels appetite is reduced, but is gaining back weight. Advised to eliminate sweetened soft drinks from diet.   Change Wegovy  2.4 to  tirzepatide (ZEPBOUND) 7.5 MG/0.5ML Pen; Inject 7.5 mg into the skin once a week.  Dispense: 2 mL; Refill: 0  Will titrate up q4 weeks if able to get approved.   2. Primary hypertension Fairly well controlled.   3. Anxiety  4. Panic attacks Well controlled on current dose of sertraline  and occasional alprazolam .    Return in about 3 months (around 03/06/2024) for Yearly Physical.         Jeralene Mom, MD  [facility] [phone] (phone) [phone] (fax)  [facility]

## 2023-12-17 ENCOUNTER — Telehealth: Payer: Self-pay

## 2023-12-17 NOTE — Telephone Encounter (Signed)
 Copied from CRM [id number]. Topic: Clinical - Medication Prior Auth >> Dec 17, 2023 10:29 AM Crispin Dolphin wrote: Reason for CRM: [facility] called. States patient will need PA for tirzepatide  (ZEPBOUND ) 7.5 MG/0.5ML Pen. Confirmed dose. Will fax form. Provider will need to complete form and send Diagnosis and any supporting documentation. Thank You

## 2023-12-20 ENCOUNTER — Other Ambulatory Visit (HOSPITAL_COMMUNITY): Payer: Self-pay

## 2023-12-20 ENCOUNTER — Telehealth: Payer: Self-pay

## 2023-12-20 NOTE — Telephone Encounter (Signed)
 Pharmacy Patient Advocate Encounter   Received notification from Pt Calls Messages that prior authorization for Zepbound  7.5MG /0.5ML pen-injectors is required/requested.   Insurance verification completed.   The patient is insured through [facility] .   Per test claim: PA required; PA submitted to above mentioned insurance via CoverMyMeds Key/confirmation #/EOC [id number] Status is pending

## 2023-12-22 ENCOUNTER — Other Ambulatory Visit: Payer: Self-pay | Admitting: Family Medicine

## 2023-12-22 NOTE — Telephone Encounter (Signed)
 Copied from CRM [id number]. Topic: Clinical - Medication Refill >> Dec 22, 2023  2:39 PM Everette C wrote: Medication: tirzepatide  (ZEPBOUND ) 7.5 MG/0.5ML Pen [[id number]]  Has the patient contacted their pharmacy? Yes (Agent: If no, request that the patient contact the pharmacy for the refill. If patient does not wish to contact the pharmacy document the reason why and proceed with request.) (Agent: If yes, when and what did the pharmacy advise?)  This is the patient's preferred pharmacy:  [id number] [place], [place] - [address] AT [facility] OF [address] [place] [address] Phone: [phone] Fax: [phone]  Is this the correct pharmacy for this prescription? Yes If no, delete pharmacy and type the correct one.   Has the prescription been filled recently? No  Is the patient out of the medication? Yes  Has the patient been seen for an appointment in the last year OR does the patient have an upcoming appointment? Yes  Can we respond through MyChart? No  Agent: Please be advised that Rx refills may take up to 3 business days. We ask that you follow-up with your pharmacy.

## 2023-12-23 ENCOUNTER — Ambulatory Visit: Payer: Self-pay

## 2023-12-23 MED ORDER — ZEPBOUND 7.5 MG/0.5ML ~~LOC~~ SOAJ: 7.5000 mg | SUBCUTANEOUS | 0 refills | Status: DC

## 2023-12-23 NOTE — Telephone Encounter (Signed)
 Requested Prescriptions  Pending Prescriptions Disp Refills   tirzepatide  (ZEPBOUND ) 7.5 MG/0.5ML Pen 2 mL 0    Sig: Inject 7.5 mg into the skin once a week.     Off-Protocol Failed - 12/23/2023  4:47 PM      Failed - Medication not assigned to a protocol, review manually.      Passed - Valid encounter within last 12 months    Recent Outpatient Visits           2 weeks ago Severe obesity (BMI >= 40) (HCC)   [facility] Lamon Pillow, MD       Future Appointments             In 2 months Fisher, Erlinda Haws, MD [facility]

## 2023-12-23 NOTE — Telephone Encounter (Signed)
 Copied from CRM [id number]. Topic: Clinical - Red Word Triage >> Dec 23, 2023  1:12 PM Jared Watkins wrote: Red Word that prompted transfer to Nurse Triage:   Pt is constipated and has not been able to use the bathroom. He has drank prune juice, magnesium, and citrus. He is sick and hurting.  Chief Complaint: vomiting, no bm, pain Symptoms: see above Frequency: constant Pertinent Negatives: Patient denies abd pain, rectal bleeding Disposition: [] ED /[] Urgent Care (no appt availability in office) / [] Appointment(In office/virtual)/ []  [facility]/ [] Home Care/ [] Refused Recommended Disposition /[] [place] [facility]/ [x]  Follow-up with PCP Additional Notes: instructed to go to ER, declined ER and apt.  Wants doctor to call him something in.  PCP office updated.   Reason for Disposition  [1] Vomiting AND [2] abdomen looks much more swollen than usual  Answer Assessment - Initial Assessment Questions 1. STOOL PATTERN OR FREQUENCY: "How often do you have a bowel movement (BM)?"  (Normal range: 3 times a day to every 3 days)  "When was your last BM?"       Every day 2. STRAINING: "Do you have to strain to have a BM?"      yes 3. RECTAL PAIN: "Does your rectum hurt when the stool comes out?" If Yes, ask: "Do you have hemorrhoids? How bad is the pain?"  (Scale 1-10; or mild, moderate, severe)     no 4. STOOL COMPOSITION: "Are the stools hard?"      yes 5. BLOOD ON STOOLS: "Has there been any blood on the toilet tissue or on the surface of the BM?" If Yes, ask: "When was the last time?"     no 6. CHRONIC CONSTIPATION: "Is this a new problem for you?"  If No, ask: "How long have you had this problem?" (days, weeks, months)      He went yesterday 7. CHANGES IN DIET OR HYDRATION: "Have there been any recent changes in your diet?" "How much fluids are you drinking on a daily basis?"  "How much have you had to drink today?"     denies 8. MEDICINES: "Have you been taking any new medicines?"  "Are you taking any narcotic pain medicines?" (e.g., Dilaudid, morphine, Percocet, Vicodin)     no 9. LAXATIVES: "Have you been using any stool softeners, laxatives, or enemas?"  If Yes, ask "What, how often, and when was the last time?"     Took prune juice and mag citrate. 10. ACTIVITY:  "How much walking do you do every day?"  "Has your activity level decreased in the past week?"        denies 11. CAUSE: "What do you think is causing the constipation?"        unknown 12. OTHER SYMPTOMS: "Do you have any other symptoms?" (e.g., abdomen pain, bloating, fever, vomiting)       Vomiting phlegm 13. MEDICAL HISTORY: "Do you have a history of hemorrhoids, rectal fissures, or rectal surgery or rectal abscess?"         no 14. PREGNANCY: "Is there any chance you are pregnant?" "When was your last menstrual period?"       na  Protocols used: Constipation-A-AH

## 2023-12-29 ENCOUNTER — Telehealth: Payer: Self-pay | Admitting: Family Medicine

## 2023-12-29 ENCOUNTER — Other Ambulatory Visit: Payer: Self-pay | Admitting: Family Medicine

## 2023-12-29 MED ORDER — ZEPBOUND 7.5 MG/0.5ML ~~LOC~~ SOAJ
7.5000 mg | SUBCUTANEOUS | 1 refills | Status: DC
Start: 2023-12-29 — End: 2024-02-20

## 2023-12-29 NOTE — Telephone Encounter (Signed)
 [facility] does not have Zepbound  at the moment and patient was told to have this sent to [facility] on [address]  Please.

## 2024-01-08 ENCOUNTER — Other Ambulatory Visit: Payer: Self-pay | Admitting: Family Medicine

## 2024-01-08 DIAGNOSIS — F410 Panic disorder [episodic paroxysmal anxiety] without agoraphobia: Secondary | ICD-10-CM

## 2024-01-08 DIAGNOSIS — F419 Anxiety disorder, unspecified: Secondary | ICD-10-CM

## 2024-02-19 ENCOUNTER — Other Ambulatory Visit: Payer: Self-pay | Admitting: Family Medicine

## 2024-03-06 ENCOUNTER — Encounter: Admitting: Family Medicine

## 2024-03-08 ENCOUNTER — Ambulatory Visit (INDEPENDENT_AMBULATORY_CARE_PROVIDER_SITE_OTHER): Admitting: Family Medicine

## 2024-03-08 ENCOUNTER — Encounter: Payer: Self-pay | Admitting: Family Medicine

## 2024-03-08 VITALS — BP 124/90 | HR 98 | Temp 98.1°F | Ht 74.0 in | Wt 347.6 lb

## 2024-03-08 DIAGNOSIS — I10 Essential (primary) hypertension: Secondary | ICD-10-CM

## 2024-03-08 DIAGNOSIS — R7303 Prediabetes: Secondary | ICD-10-CM

## 2024-03-08 DIAGNOSIS — Z0000 Encounter for general adult medical examination without abnormal findings: Secondary | ICD-10-CM

## 2024-03-08 DIAGNOSIS — K760 Fatty (change of) liver, not elsewhere classified: Secondary | ICD-10-CM

## 2024-03-08 DIAGNOSIS — F419 Anxiety disorder, unspecified: Secondary | ICD-10-CM

## 2024-03-08 DIAGNOSIS — Z860101 Personal history of adenomatous and serrated colon polyps: Secondary | ICD-10-CM

## 2024-03-08 DIAGNOSIS — Z860100 Personal history of colon polyps, unspecified: Secondary | ICD-10-CM

## 2024-03-08 DIAGNOSIS — E785 Hyperlipidemia, unspecified: Secondary | ICD-10-CM | POA: Diagnosis not present

## 2024-03-08 MED ORDER — ZEPBOUND 12.5 MG/0.5ML ~~LOC~~ SOAJ: 12.5000 mg | SUBCUTANEOUS | 2 refills | Status: DC

## 2024-03-08 NOTE — Patient Instructions (Signed)
 Jared Watkins  Please review the attached list of medications and notify my office if there are any errors.   . Please bring all of your medications to every appointment so we can make sure that our medication list is the same as yours.

## 2024-03-08 NOTE — Progress Notes (Signed)
 Established patient visit   Patient: Jared Watkins   DOB: [birth year]   48 y.o. Male  MRN: [id number] Visit Date: 03/08/2024  Today's healthcare provider: Nancyann Perry, MD   Chief Complaint  Patient presents with   Annual Exam    Diet: normal  Exercise: 3x week going to the gym for 1 hour Sleep: Has issues staying asleep Overall Feeling: Well, wishes he could sleep more    Subjective    Discussed the use of AI scribe software for clinical note transcription with the patient, who gave verbal consent to proceed.  History of Present Illness   Jared Watkins is a 48 year old male who presents for an annual physical exam.  He reports taking his blood pressure medication daily as prescribed with no issues or side effects.  He is on Zepbound  for weight management. He experienced a lapse in medication adherence during a two-week period, leading to increased alcohol and food consumption, particularly during a senior week event and the Fourth of July. He has since resumed his medication and dietary regimen with no side effects from Zepbound  after learning the proper injection technique and managing his diet. He occasionally uses a fiber-like pill daily, though he could not recall the exact name.  He works two jobs, starting early in the morning and finishing after lunch, six days a week. He reports difficulty sleeping and uses a combination of medications, including a generic form of Ativan  and sleeping pills, to aid sleep. He sometimes consumes alcohol to help with sleep but is cautious not to overuse medications when he might need to be alert for work.  He experiences seasonal allergies, for which he uses Flonase  nasal spray and eye drops as needed. He does not use the nasal spray daily, only when symptoms arise. He experiences matted eyes due to allergies but finds relief with eye drops.  He does not use his CPAP machine, citing discomfort with wearing it, and is interested in  alternative treatments for his sleep apnea. His diet consists mainly of protein, avoiding bread and fried foods, although he occasionally indulges in cheeseburgers. He typically skips breakfast and lunch, eating mainly in the afternoon.       Medications: Outpatient Medications Prior to Visit  Medication Sig   albuterol  (VENTOLIN  HFA) 108 (90 Base) MCG/ACT inhaler INHALE 2 PUFFS INTO THE LUNGS EVERY 6 HOURS AS NEEDED FOR WHEEZING OR SHORTNESS OF BREATH   allopurinol  (ZYLOPRIM ) 100 MG tablet TAKE 1 TABLET(100 MG) BY MOUTH DAILY   ALPRAZolam  (XANAX ) 0.5 MG tablet Take 1 tablet (0.5 mg total) by mouth 2 (two) times daily as needed for sleep or anxiety.   azelastine  (OPTIVAR ) 0.05 % ophthalmic solution Place 1 drop into both eyes 2 (two) times daily.   colchicine  (COLCRYS ) 0.6 MG tablet TAKE 2 TABLETS BY MOUTH TODAY, THEN 1 TABLET DAILY UNTIL SYMPTOMS IMPROVED   eszopiclone  3 MG TABS Take 1 tablet (3 mg total) by mouth at bedtime as needed. Take immediately before bedtime   fexofenadine (ALLEGRA) 180 MG tablet Take 180 mg by mouth daily.   fluticasone  (FLONASE ) 50 MCG/ACT nasal spray Place 2 sprays into both nostrils daily.   lisinopril -hydrochlorothiazide  (ZESTORETIC ) 20-25 MG tablet TAKE 1 TABLET BY MOUTH DAILY   LORazepam  (ATIVAN ) 0.5 MG tablet TAKE 1 TABLET(0.5 MG) BY MOUTH TWICE DAILY AS NEEDED FOR ANXIETY   MAGNESIUM PO Take by mouth daily.   metoprolol  succinate (TOPROL -XL) 25 MG 24 hr tablet Take 0.5 tablets (  12.5 mg total) by mouth daily.   Multiple Vitamins-Minerals (MULTIVITAMIN MEN PO) Take by mouth daily.   omeprazole  (PRILOSEC) 20 MG capsule TAKE 1 CAPSULE(20 MG) BY MOUTH DAILY   sertraline  (ZOLOFT ) 100 MG tablet TAKE 1 TABLET(100 MG) BY MOUTH DAILY   [DISCONTINUED] promethazine  (PHENERGAN ) 25 MG tablet Take 1 tablet (25 mg total) by mouth every 8 (eight) hours as needed for nausea or vomiting.   tirzepatide  10 MG/0.5ML injection vial Inject 10 mg into the skin once a week. BRAND  NAME ZEPBOUND  FOR WEIGHT MANAGEMENT   No facility-administered medications prior to visit.   Review of Systems  Constitutional:  Negative for appetite change, chills and fever.  Respiratory:  Negative for chest tightness, shortness of breath and wheezing.   Cardiovascular:  Negative for chest pain and palpitations.  Gastrointestinal:  Negative for abdominal pain, nausea and vomiting.       Objective    BP (!) 124/90 (BP Location: Left Arm, Patient Position: Sitting, Cuff Size: Normal)   Pulse 98   Temp 98.1 F (36.7 C) (Oral)   Ht 6' 2 (1.88 m)   Wt (!) 347 lb 9.6 oz (157.7 kg)   SpO2 94%   BMI 44.63 kg/m   Physical Exam   General Appearance:    Severely obese male. Alert, cooperative, in no acute distress, appears stated age  Head:    Normocephalic, without obvious abnormality, atraumatic  Eyes:    PERRL, conjunctiva/corneas clear, EOM's intact, fundi    benign, both eyes       Ears:    Normal TM's and external ear canals, both ears  Nose:   Nares normal, septum midline, mucosa normal, no drainage   or sinus tenderness  Throat:   Lips, mucosa, and tongue normal; teeth and gums normal  Neck:   Supple, symmetrical, trachea midline, no adenopathy;       thyroid :  No enlargement/tenderness/nodules; no carotid   bruit or JVD  Back:     Symmetric, no curvature, ROM normal, no CVA tenderness  Lungs:     Clear to auscultation bilaterally, respirations unlabored  Chest wall:    No tenderness or deformity  Heart:    Normal heart rate. Normal rhythm. No murmurs, rubs, or gallops.  S1 and S2 normal  Abdomen:     Soft, non-tender, bowel sounds active all four quadrants,    no masses, no organomegaly  Genitalia:    deferred  Rectal:    deferred  Extremities:   All extremities are intact. No cyanosis or edema  Pulses:   2+ and symmetric all extremities  Skin:   Skin color, texture, turgor normal, no rashes or lesions  Lymph nodes:   Cervical, supraclavicular, and axillary nodes  normal  Neurologic:   CNII-XII intact. Normal strength, sensation and reflexes      throughout      Assessment & Plan    1. Annual physical exam (Primary)  - CBC w/Diff/Platelet - Comprehensive Metabolic Panel (CMET) - Lipid Panel With LDL/HDL Ratio  2. Hyperlipidemia, unspecified hyperlipidemia type Managed with diet.  - Lipid Panel With LDL/HDL Ratio  3. Primary hypertension Well controlled.  Continue current medications.    4. Anxiety Doing well with sertraline  and prn benzodiazepine.   5. Prediabetes  - Hemoglobin A1c  6. Severe obesity (BMI >= 40) (HCC) Tolerating 10mg  Zepbound  well, increase to tirzepatide  (ZEPBOUND ) 12.5 MG/0.5ML Pen; Inject 12.5 mg into the skin once a week.  Dispense: 2 mL; Refill: 2  7.  Fatty liver Checking transaminases today.   8. History of adenomatous polyp of colon Up to date on follow up colonoscopy  Return in about 3 months (around 06/08/2024).     Nancyann Perry, MD  [facility] [phone] (phone) [phone] (fax)  [facility]

## 2024-03-09 ENCOUNTER — Ambulatory Visit: Payer: Self-pay | Admitting: Family Medicine

## 2024-03-09 LAB — CBC WITH DIFFERENTIAL/PLATELET
Basophils Absolute: 0 x10E3/uL (ref 0.0–0.2)
Basos: 0 %
EOS (ABSOLUTE): 0.1 x10E3/uL (ref 0.0–0.4)
Eos: 1 %
Hematocrit: 44.8 % (ref 37.5–51.0)
Hemoglobin: 15.2 g/dL (ref 13.0–17.7)
Immature Grans (Abs): 0 x10E3/uL (ref 0.0–0.1)
Immature Granulocytes: 0 %
Lymphocytes Absolute: 2.1 x10E3/uL (ref 0.7–3.1)
Lymphs: 18 %
MCH: 30.8 pg (ref 26.6–33.0)
MCHC: 33.9 g/dL (ref 31.5–35.7)
MCV: 91 fL (ref 79–97)
Monocytes Absolute: 1 x10E3/uL — ABNORMAL HIGH (ref 0.1–0.9)
Monocytes: 8 %
Neutrophils Absolute: 8.5 x10E3/uL — ABNORMAL HIGH (ref 1.4–7.0)
Neutrophils: 73 %
Platelets: 212 x10E3/uL (ref 150–450)
RBC: 4.94 x10E6/uL (ref 4.14–5.80)
RDW: 14.1 % (ref 11.6–15.4)
WBC: 11.8 x10E3/uL — ABNORMAL HIGH (ref 3.4–10.8)

## 2024-03-09 LAB — LIPID PANEL WITH LDL/HDL RATIO
Cholesterol, Total: 207 mg/dL — ABNORMAL HIGH (ref 100–199)
HDL: 38 mg/dL — ABNORMAL LOW (ref >39)
LDL Chol Calc (NIH): 130 mg/dL — ABNORMAL HIGH (ref 0–99)
LDL/HDL Ratio: 3.4 ratio (ref 0.0–3.6)
Triglycerides: 217 mg/dL — ABNORMAL HIGH (ref 0–149)
VLDL Cholesterol Cal: 39 mg/dL (ref 5–40)

## 2024-03-09 LAB — COMPREHENSIVE METABOLIC PANEL WITH GFR
ALT: 37 IU/L (ref 0–44)
AST: 39 IU/L (ref 0–40)
Albumin: 4.5 g/dL (ref 4.1–5.1)
Alkaline Phosphatase: 97 IU/L (ref 44–121)
BUN/Creatinine Ratio: 19 (ref 9–20)
BUN: 23 mg/dL (ref 6–24)
Bilirubin Total: 0.8 mg/dL (ref 0.0–1.2)
CO2: 21 mmol/L (ref 20–29)
Calcium: 9.4 mg/dL (ref 8.7–10.2)
Chloride: 101 mmol/L (ref 96–106)
Creatinine, Ser: 1.2 mg/dL (ref 0.76–1.27)
Globulin, Total: 2.8 g/dL (ref 1.5–4.5)
Glucose: 101 mg/dL — ABNORMAL HIGH (ref 70–99)
Potassium: 3.8 mmol/L (ref 3.5–5.2)
Sodium: 139 mmol/L (ref 134–144)
Total Protein: 7.3 g/dL (ref 6.0–8.5)
eGFR: 75 mL/min/1.73 (ref >59)

## 2024-03-09 LAB — HEMOGLOBIN A1C
Est. average glucose Bld gHb Est-mCnc: 105 mg/dL
Hgb A1c MFr Bld: 5.3 % (ref 4.8–5.6)

## 2024-06-12 ENCOUNTER — Other Ambulatory Visit: Payer: Self-pay | Admitting: Family Medicine

## 2024-06-18 ENCOUNTER — Other Ambulatory Visit: Payer: Self-pay | Admitting: Family Medicine

## 2024-09-06 ENCOUNTER — Encounter: Payer: Self-pay | Admitting: Family Medicine

## 2024-09-06 ENCOUNTER — Ambulatory Visit: Admitting: Family Medicine

## 2024-09-06 VITALS — BP 148/96 | HR 84 | Resp 16 | Ht 74.0 in | Wt 297.0 lb

## 2024-09-06 DIAGNOSIS — F419 Anxiety disorder, unspecified: Secondary | ICD-10-CM

## 2024-09-06 DIAGNOSIS — I517 Cardiomegaly: Secondary | ICD-10-CM | POA: Diagnosis not present

## 2024-09-06 DIAGNOSIS — E66812 Obesity, class 2: Secondary | ICD-10-CM

## 2024-09-06 DIAGNOSIS — M109 Gout, unspecified: Secondary | ICD-10-CM

## 2024-09-06 DIAGNOSIS — I10 Essential (primary) hypertension: Secondary | ICD-10-CM

## 2024-09-06 DIAGNOSIS — Z6838 Body mass index (BMI) 38.0-38.9, adult: Secondary | ICD-10-CM

## 2024-09-06 DIAGNOSIS — E790 Hyperuricemia without signs of inflammatory arthritis and tophaceous disease: Secondary | ICD-10-CM | POA: Diagnosis not present

## 2024-09-06 MED ORDER — LORAZEPAM 0.5 MG PO TABS: 0.5000 mg | ORAL_TABLET | Freq: Two times a day (BID) | ORAL | 1 refills | Status: AC

## 2024-09-06 MED ORDER — COLCHICINE 0.6 MG PO TABS: ORAL_TABLET | ORAL | 1 refills | Status: AC

## 2024-09-06 MED ORDER — ZEPBOUND 12.5 MG/0.5ML ~~LOC~~ SOAJ: 12.5000 mg | SUBCUTANEOUS | 5 refills | Status: AC

## 2024-09-06 MED ORDER — METOPROLOL SUCCINATE ER 25 MG PO TB24: 25.0000 mg | ORAL_TABLET | Freq: Every day | ORAL | Status: AC

## 2024-09-06 NOTE — Progress Notes (Signed)
 "     Established patient visit   Patient: Jared Watkins   DOB: 23-Aug-1975   49 y.o. Male  MRN: [id number] Visit Date: 09/06/2024  Today's healthcare provider: Nancyann Perry, MD   Chief Complaint  Patient presents with   Follow-up   Hypertension    F/u HTN . No other concerns   Subjective    Discussed the use of AI scribe software for clinical note transcription with the patient, who gave verbal consent to proceed.  History of Present Illness   Jared Watkins is a 49 year old male with hypertension and obesity who presents for routine follow up.   He has been experiencing a headache today. The pain is located across both temples and is not worse on one side than the other. The headache progressed throughout the day rather than starting immediately upon waking. No photophobia is present as lights do not bother his eyes.  He has not missed any doses of his blood pressure medication recently, although he has been out of Zepbound  for about two weeks. He takes half of a 25 mg metoprolol  pill and a full pill of Zestoretic  (lisinopril  HCTZ). His blood pressure readings at home are usually around 120s, with the highest being 137. He does not regularly check his blood pressure at home.  He has not been taking much colchicine  recently and has not needed Ativan , although he prefers to have it available. He has plenty of metoprolol  and Zestoretic , and his Zoloft  supply is also sufficient.     Lab Results  Component Value Date   NA 139 03/08/2024   K 3.8 03/08/2024   CREATININE 1.20 03/08/2024   EGFR 75 03/08/2024   GLUCOSE 101 (H) 03/08/2024   Lab Results  Component Value Date   CHOL 207 (H) 03/08/2024   HDL 38 (L) 03/08/2024   LDLCALC 130 (H) 03/08/2024   LDLDIRECT 99.0 04/06/2016   TRIG 217 (H) 03/08/2024   CHOLHDL 5.1 (H) 01/25/2023   Wt Readings from Last 3 Encounters:  09/06/24 297 lb (134.7 kg)  03/08/24 (!) 347 lb 9.6 oz (157.7 kg)  12/06/23 (!) 352 lb 9.6 oz (159.9 kg)      Medications: Show/hide medication list[1] Review of Systems  Constitutional:  Negative for appetite change, chills and fever.  Respiratory:  Negative for chest tightness, shortness of breath and wheezing.   Cardiovascular:  Negative for chest pain and palpitations.  Gastrointestinal:  Negative for abdominal pain, nausea and vomiting.       Objective    BP (!) 148/96   Pulse 84   Resp 16   Ht 6' 2 (1.88 m)   Wt 297 lb (134.7 kg)   SpO2 100%   BMI 38.13 kg/m   Physical Exam   General: Appearance:    Mildly obese male in no acute distress  Eyes:    PERRL, conjunctiva/corneas clear, EOM's intact       Lungs:     Clear to auscultation bilaterally, respirations unlabored  Heart:    Normal heart rate. Normal rhythm. No murmurs, rubs, or gallops.    MS:   All extremities are intact.    Neurologic:   Awake, alert, oriented x 3. No apparent focal neurological defect.        Assessment & Plan       Primary hypertension Blood pressure elevated at 148/96 mmHg. Current regimen includes metoprolol  and lisinopril  HCTZ. - Increased metoprolol  to a full tablet.  Severe obesity Not taking Zepbound   for two weeks due to supply issues. BMI down to 38 associated with hypertension, gout, sleep apnea and LVH.   Gout Minimal use of colchicine  with few tablets remaining. - Refilled colchicine  prescription.  Anxiety Minimal use of lorazepam  with few tablets remaining. - Refilled lorazepam  prescription.   LVH (left ventricular hypertrophy) Increasing metoprolol  today.   Hyperuricemia Infrequent gout flares on current dose of allopurinol . He is out of colchicine  which was refilled today.   Anxiety Fairly well controlled on current dose of sertraline  and prn lorazepam .   Other orders - colchicine  (COLCRYS ) 0.6 MG tablet; TAKE 2 TABLETS BY MOUTH TODAY, THEN 1 TABLET DAILY UNTIL SYMPTOMS IMPROVED  Dispense: 90 tablet; Refill: 1 - tirzepatide  (ZEPBOUND ) 12.5 MG/0.5ML Pen; Inject  12.5 mg into the skin once a week.  Dispense: 2 mL; Refill: 5 - LORazepam  (ATIVAN ) 0.5 MG tablet; Take 1 tablet (0.5 mg total) by mouth 2 (two) times daily.  Dispense: 20 tablet; Refill: 1      No follow-ups on file.     Nancyann Perry, MD  [facility] [phone] (phone) [phone] (fax)  [facility]    [1]  Outpatient Medications Prior to Visit  Medication Sig   albuterol  (VENTOLIN  HFA) 108 (90 Base) MCG/ACT inhaler INHALE 2 PUFFS INTO THE LUNGS EVERY 6 HOURS AS NEEDED FOR WHEEZING OR SHORTNESS OF BREATH   allopurinol  (ZYLOPRIM ) 100 MG tablet TAKE 1 TABLET(100 MG) BY MOUTH DAILY   ALPRAZolam  (XANAX ) 0.5 MG tablet Take 1 tablet (0.5 mg total) by mouth 2 (two) times daily as needed for sleep or anxiety.   azelastine  (OPTIVAR ) 0.05 % ophthalmic solution Place 1 drop into both eyes 2 (two) times daily.   colchicine  (COLCRYS ) 0.6 MG tablet TAKE 2 TABLETS BY MOUTH TODAY, THEN 1 TABLET DAILY UNTIL SYMPTOMS IMPROVED   eszopiclone  3 MG TABS Take 1 tablet (3 mg total) by mouth at bedtime as needed. Take immediately before bedtime   fexofenadine (ALLEGRA) 180 MG tablet Take 180 mg by mouth daily.   fluticasone  (FLONASE ) 50 MCG/ACT nasal spray Place 2 sprays into both nostrils daily.   lisinopril -hydrochlorothiazide  (ZESTORETIC ) 20-25 MG tablet TAKE 1 TABLET BY MOUTH DAILY   LORazepam  (ATIVAN ) 0.5 MG tablet TAKE 1 TABLET(0.5 MG) BY MOUTH TWICE DAILY AS NEEDED FOR ANXIETY   MAGNESIUM PO Take by mouth daily.   metoprolol  succinate (TOPROL -XL) 25 MG 24 hr tablet Take 0.5 tablets (12.5 mg total) by mouth daily.   Multiple Vitamins-Minerals (MULTIVITAMIN MEN PO) Take by mouth daily.   omeprazole  (PRILOSEC) 20 MG capsule TAKE 1 CAPSULE(20 MG) BY MOUTH DAILY   sertraline  (ZOLOFT ) 100 MG tablet TAKE 1 TABLET(100 MG) BY MOUTH DAILY   tirzepatide  (ZEPBOUND ) 12.5 MG/0.5ML Pen ADMINISTER 12.5 MG UNDER THE SKIN 1 TIME A WEEK   No facility-administered  medications prior to visit.   "

## 2025-03-07 ENCOUNTER — Ambulatory Visit: Admitting: Family Medicine
